# Patient Record
Sex: Female | Born: 1965 | Race: Black or African American | Hispanic: No | Marital: Single | Smoking: Former smoker
Health system: Southern US, Community
[De-identification: ages and names within clinical notes are randomized; demographics above are authoritative.]

## PROBLEM LIST (undated history)

## (undated) DIAGNOSIS — K219 Gastro-esophageal reflux disease without esophagitis: Secondary | ICD-10-CM

## (undated) DIAGNOSIS — I2699 Other pulmonary embolism without acute cor pulmonale: Secondary | ICD-10-CM

## (undated) DIAGNOSIS — D126 Benign neoplasm of colon, unspecified: Secondary | ICD-10-CM

## (undated) DIAGNOSIS — I4581 Long QT syndrome: Secondary | ICD-10-CM

## (undated) DIAGNOSIS — Z8719 Personal history of other diseases of the digestive system: Secondary | ICD-10-CM

## (undated) DIAGNOSIS — J189 Pneumonia, unspecified organism: Secondary | ICD-10-CM

## (undated) DIAGNOSIS — R0789 Other chest pain: Secondary | ICD-10-CM

## (undated) DIAGNOSIS — I739 Peripheral vascular disease, unspecified: Secondary | ICD-10-CM

## (undated) DIAGNOSIS — I10 Essential (primary) hypertension: Secondary | ICD-10-CM

## (undated) DIAGNOSIS — F419 Anxiety disorder, unspecified: Secondary | ICD-10-CM

## (undated) DIAGNOSIS — I82409 Acute embolism and thrombosis of unspecified deep veins of unspecified lower extremity: Secondary | ICD-10-CM

## (undated) DIAGNOSIS — F329 Major depressive disorder, single episode, unspecified: Secondary | ICD-10-CM

## (undated) DIAGNOSIS — R011 Cardiac murmur, unspecified: Secondary | ICD-10-CM

## (undated) DIAGNOSIS — R9431 Abnormal electrocardiogram [ECG] [EKG]: Secondary | ICD-10-CM

## (undated) DIAGNOSIS — R0600 Dyspnea, unspecified: Secondary | ICD-10-CM

## (undated) DIAGNOSIS — D649 Anemia, unspecified: Secondary | ICD-10-CM

## (undated) DIAGNOSIS — F32A Depression, unspecified: Secondary | ICD-10-CM

## (undated) DIAGNOSIS — R569 Unspecified convulsions: Secondary | ICD-10-CM

## (undated) DIAGNOSIS — E119 Type 2 diabetes mellitus without complications: Secondary | ICD-10-CM

## (undated) DIAGNOSIS — G4733 Obstructive sleep apnea (adult) (pediatric): Secondary | ICD-10-CM

## (undated) DIAGNOSIS — E669 Obesity, unspecified: Secondary | ICD-10-CM

## (undated) DIAGNOSIS — M1990 Unspecified osteoarthritis, unspecified site: Secondary | ICD-10-CM

## (undated) HISTORY — DX: Obstructive sleep apnea (adult) (pediatric): G47.33

## (undated) HISTORY — DX: Other pulmonary embolism without acute cor pulmonale: I26.99

## (undated) HISTORY — PX: COLONOSCOPY: SHX174

## (undated) HISTORY — PX: CARDIAC CATHETERIZATION: SHX172

## (undated) HISTORY — PX: DILATION AND CURETTAGE OF UTERUS: SHX78

## (undated) HISTORY — DX: Acute embolism and thrombosis of unspecified deep veins of unspecified lower extremity: I82.409

## (undated) HISTORY — DX: Obesity, unspecified: E66.9

## (undated) HISTORY — DX: Abnormal electrocardiogram (ECG) (EKG): R94.31

## (undated) HISTORY — DX: Essential (primary) hypertension: I10

## (undated) HISTORY — DX: Other chest pain: R07.89

## (undated) HISTORY — PX: TOTAL HIP ARTHROPLASTY: SHX124

## (undated) HISTORY — PX: LUMBAR DISC SURGERY: SHX700

## (undated) HISTORY — PX: OTHER SURGICAL HISTORY: SHX169

## (undated) HISTORY — PX: BACK SURGERY: SHX140

---

## 1999-08-04 ENCOUNTER — Encounter (INDEPENDENT_AMBULATORY_CARE_PROVIDER_SITE_OTHER): Payer: Self-pay | Admitting: *Deleted

## 1999-08-04 ENCOUNTER — Ambulatory Visit (HOSPITAL_BASED_OUTPATIENT_CLINIC_OR_DEPARTMENT_OTHER): Admission: RE | Admit: 1999-08-04 | Discharge: 1999-08-04 | Payer: Self-pay | Admitting: Orthopedic Surgery

## 1999-10-12 ENCOUNTER — Other Ambulatory Visit: Admission: RE | Admit: 1999-10-12 | Discharge: 1999-10-12 | Payer: Self-pay | Admitting: Family Medicine

## 1999-11-03 ENCOUNTER — Emergency Department (HOSPITAL_COMMUNITY): Admission: EM | Admit: 1999-11-03 | Discharge: 1999-11-03 | Payer: Self-pay

## 1999-11-12 ENCOUNTER — Other Ambulatory Visit: Admission: RE | Admit: 1999-11-12 | Discharge: 1999-11-12 | Payer: Self-pay | Admitting: Gynecology

## 1999-11-12 ENCOUNTER — Encounter (INDEPENDENT_AMBULATORY_CARE_PROVIDER_SITE_OTHER): Payer: Self-pay

## 1999-11-15 ENCOUNTER — Encounter: Admission: RE | Admit: 1999-11-15 | Discharge: 1999-11-15 | Payer: Self-pay | Admitting: Family Medicine

## 1999-11-15 ENCOUNTER — Encounter: Payer: Self-pay | Admitting: Family Medicine

## 1999-11-17 ENCOUNTER — Emergency Department (HOSPITAL_COMMUNITY): Admission: EM | Admit: 1999-11-17 | Discharge: 1999-11-17 | Payer: Self-pay

## 2000-10-31 ENCOUNTER — Other Ambulatory Visit: Admission: RE | Admit: 2000-10-31 | Discharge: 2000-10-31 | Payer: Self-pay | Admitting: Family Medicine

## 2001-02-05 ENCOUNTER — Encounter: Payer: Self-pay | Admitting: Obstetrics and Gynecology

## 2001-02-05 ENCOUNTER — Ambulatory Visit (HOSPITAL_COMMUNITY): Admission: RE | Admit: 2001-02-05 | Discharge: 2001-02-05 | Payer: Self-pay | Admitting: Obstetrics and Gynecology

## 2001-05-19 ENCOUNTER — Inpatient Hospital Stay (HOSPITAL_COMMUNITY): Admission: AD | Admit: 2001-05-19 | Discharge: 2001-05-19 | Payer: Self-pay | Admitting: Obstetrics & Gynecology

## 2001-06-04 ENCOUNTER — Inpatient Hospital Stay (HOSPITAL_COMMUNITY): Admission: AD | Admit: 2001-06-04 | Discharge: 2001-06-07 | Payer: Self-pay | Admitting: Obstetrics & Gynecology

## 2002-05-11 ENCOUNTER — Emergency Department (HOSPITAL_COMMUNITY): Admission: EM | Admit: 2002-05-11 | Discharge: 2002-05-11 | Payer: Self-pay | Admitting: Emergency Medicine

## 2003-04-15 ENCOUNTER — Other Ambulatory Visit: Admission: RE | Admit: 2003-04-15 | Discharge: 2003-04-15 | Payer: Self-pay | Admitting: Family Medicine

## 2003-11-14 ENCOUNTER — Encounter: Admission: RE | Admit: 2003-11-14 | Discharge: 2003-11-14 | Payer: Self-pay | Admitting: Orthopedic Surgery

## 2003-11-28 ENCOUNTER — Encounter: Admission: RE | Admit: 2003-11-28 | Discharge: 2003-11-28 | Payer: Self-pay | Admitting: Orthopedic Surgery

## 2003-12-12 ENCOUNTER — Encounter: Admission: RE | Admit: 2003-12-12 | Discharge: 2003-12-12 | Payer: Self-pay | Admitting: Orthopedic Surgery

## 2005-05-30 ENCOUNTER — Ambulatory Visit: Payer: Self-pay | Admitting: Internal Medicine

## 2005-10-17 ENCOUNTER — Emergency Department (HOSPITAL_COMMUNITY): Admission: EM | Admit: 2005-10-17 | Discharge: 2005-10-18 | Payer: Self-pay | Admitting: Emergency Medicine

## 2006-01-19 ENCOUNTER — Ambulatory Visit: Payer: Self-pay | Admitting: Family Medicine

## 2006-01-19 ENCOUNTER — Inpatient Hospital Stay (HOSPITAL_COMMUNITY): Admission: EM | Admit: 2006-01-19 | Discharge: 2006-01-20 | Payer: Self-pay | Admitting: Emergency Medicine

## 2006-01-19 ENCOUNTER — Ambulatory Visit: Payer: Self-pay | Admitting: Internal Medicine

## 2006-02-25 ENCOUNTER — Ambulatory Visit (HOSPITAL_BASED_OUTPATIENT_CLINIC_OR_DEPARTMENT_OTHER): Admission: RE | Admit: 2006-02-25 | Discharge: 2006-02-25 | Payer: Self-pay | Admitting: Family Medicine

## 2006-03-07 ENCOUNTER — Ambulatory Visit: Payer: Self-pay | Admitting: Cardiology

## 2006-03-07 ENCOUNTER — Inpatient Hospital Stay (HOSPITAL_COMMUNITY): Admission: RE | Admit: 2006-03-07 | Discharge: 2006-03-11 | Payer: Self-pay | Admitting: Orthopedic Surgery

## 2006-03-12 ENCOUNTER — Emergency Department (HOSPITAL_COMMUNITY): Admission: EM | Admit: 2006-03-12 | Discharge: 2006-03-12 | Payer: Self-pay | Admitting: Emergency Medicine

## 2006-04-01 HISTORY — PX: JOINT REPLACEMENT: SHX530

## 2006-06-01 ENCOUNTER — Ambulatory Visit: Payer: Self-pay | Admitting: Internal Medicine

## 2006-08-07 ENCOUNTER — Emergency Department (HOSPITAL_COMMUNITY): Admission: EM | Admit: 2006-08-07 | Discharge: 2006-08-07 | Payer: Self-pay | Admitting: Emergency Medicine

## 2006-09-28 ENCOUNTER — Encounter: Admission: RE | Admit: 2006-09-28 | Discharge: 2006-09-28 | Payer: Self-pay | Admitting: Orthopedic Surgery

## 2006-10-12 ENCOUNTER — Encounter: Admission: RE | Admit: 2006-10-12 | Discharge: 2006-10-12 | Payer: Self-pay | Admitting: Orthopedic Surgery

## 2007-01-14 ENCOUNTER — Ambulatory Visit: Payer: Self-pay | Admitting: Vascular Surgery

## 2007-01-14 ENCOUNTER — Emergency Department (HOSPITAL_COMMUNITY): Admission: EM | Admit: 2007-01-14 | Discharge: 2007-01-14 | Payer: Self-pay | Admitting: Emergency Medicine

## 2007-01-31 ENCOUNTER — Ambulatory Visit: Payer: Self-pay | Admitting: Vascular Surgery

## 2007-04-17 ENCOUNTER — Encounter: Admission: RE | Admit: 2007-04-17 | Discharge: 2007-04-17 | Payer: Self-pay | Admitting: Neurosurgery

## 2007-06-07 ENCOUNTER — Encounter: Admission: RE | Admit: 2007-06-07 | Discharge: 2007-06-07 | Payer: Self-pay | Admitting: Neurosurgery

## 2007-06-19 ENCOUNTER — Emergency Department (HOSPITAL_COMMUNITY): Admission: EM | Admit: 2007-06-19 | Discharge: 2007-06-19 | Payer: Self-pay | Admitting: Emergency Medicine

## 2007-06-20 ENCOUNTER — Emergency Department (HOSPITAL_COMMUNITY): Admission: EM | Admit: 2007-06-20 | Discharge: 2007-06-20 | Payer: Self-pay | Admitting: Emergency Medicine

## 2007-06-29 ENCOUNTER — Ambulatory Visit: Payer: Self-pay | Admitting: Internal Medicine

## 2007-07-06 ENCOUNTER — Inpatient Hospital Stay (HOSPITAL_COMMUNITY): Admission: RE | Admit: 2007-07-06 | Discharge: 2007-07-11 | Payer: Self-pay | Admitting: Neurosurgery

## 2007-07-23 ENCOUNTER — Emergency Department (HOSPITAL_COMMUNITY): Admission: EM | Admit: 2007-07-23 | Discharge: 2007-07-23 | Payer: Self-pay | Admitting: Emergency Medicine

## 2008-02-08 ENCOUNTER — Encounter: Admission: RE | Admit: 2008-02-08 | Discharge: 2008-02-08 | Payer: Self-pay | Admitting: Family Medicine

## 2008-08-21 DIAGNOSIS — F3289 Other specified depressive episodes: Secondary | ICD-10-CM | POA: Insufficient documentation

## 2008-08-21 DIAGNOSIS — F329 Major depressive disorder, single episode, unspecified: Secondary | ICD-10-CM

## 2008-08-21 DIAGNOSIS — Q898 Other specified congenital malformations: Secondary | ICD-10-CM | POA: Insufficient documentation

## 2008-08-21 DIAGNOSIS — M810 Age-related osteoporosis without current pathological fracture: Secondary | ICD-10-CM | POA: Insufficient documentation

## 2008-09-13 ENCOUNTER — Emergency Department: Payer: Self-pay | Admitting: Emergency Medicine

## 2008-10-22 ENCOUNTER — Encounter: Admission: RE | Admit: 2008-10-22 | Discharge: 2008-11-19 | Payer: Self-pay | Admitting: Orthopedic Surgery

## 2008-11-12 ENCOUNTER — Emergency Department: Payer: Self-pay | Admitting: Emergency Medicine

## 2008-11-19 ENCOUNTER — Encounter (INDEPENDENT_AMBULATORY_CARE_PROVIDER_SITE_OTHER): Payer: Self-pay | Admitting: *Deleted

## 2009-01-08 ENCOUNTER — Ambulatory Visit: Payer: Self-pay | Admitting: Cardiovascular Disease

## 2009-01-08 ENCOUNTER — Encounter: Payer: Self-pay | Admitting: Nurse Practitioner

## 2009-01-08 DIAGNOSIS — R079 Chest pain, unspecified: Secondary | ICD-10-CM | POA: Insufficient documentation

## 2009-01-19 ENCOUNTER — Telehealth (INDEPENDENT_AMBULATORY_CARE_PROVIDER_SITE_OTHER): Payer: Self-pay | Admitting: Radiology

## 2009-01-20 ENCOUNTER — Ambulatory Visit: Payer: Self-pay

## 2009-01-21 ENCOUNTER — Ambulatory Visit: Payer: Self-pay

## 2009-01-21 ENCOUNTER — Encounter: Payer: Self-pay | Admitting: Cardiovascular Disease

## 2009-02-09 ENCOUNTER — Telehealth: Payer: Self-pay | Admitting: Internal Medicine

## 2009-02-25 ENCOUNTER — Encounter: Payer: Self-pay | Admitting: Internal Medicine

## 2009-03-06 ENCOUNTER — Encounter: Admission: RE | Admit: 2009-03-06 | Discharge: 2009-03-06 | Payer: Self-pay | Admitting: Family Medicine

## 2009-03-11 ENCOUNTER — Encounter (INDEPENDENT_AMBULATORY_CARE_PROVIDER_SITE_OTHER): Payer: Self-pay | Admitting: *Deleted

## 2009-04-09 ENCOUNTER — Ambulatory Visit: Payer: Self-pay | Admitting: Gastroenterology

## 2009-05-15 ENCOUNTER — Ambulatory Visit: Payer: Self-pay | Admitting: Internal Medicine

## 2009-05-15 DIAGNOSIS — E349 Endocrine disorder, unspecified: Secondary | ICD-10-CM | POA: Insufficient documentation

## 2009-06-26 ENCOUNTER — Emergency Department: Payer: Self-pay | Admitting: Emergency Medicine

## 2009-11-26 ENCOUNTER — Emergency Department: Payer: Self-pay | Admitting: Emergency Medicine

## 2009-12-22 ENCOUNTER — Telehealth: Payer: Self-pay | Admitting: Internal Medicine

## 2010-01-02 ENCOUNTER — Encounter: Admission: RE | Admit: 2010-01-02 | Discharge: 2010-01-02 | Payer: Self-pay | Admitting: Family Medicine

## 2010-04-24 ENCOUNTER — Emergency Department: Payer: Self-pay | Admitting: Emergency Medicine

## 2010-05-22 ENCOUNTER — Encounter: Payer: Self-pay | Admitting: Family Medicine

## 2010-05-23 ENCOUNTER — Encounter: Payer: Self-pay | Admitting: Family Medicine

## 2010-06-01 NOTE — Assessment & Plan Note (Signed)
Summary: ROV   CC:  ROV/  .  History of Present Illness: Mrs. [name] is a 45 year old woman with history of prolonged QT.  she was seen most recently last fall when she presented with exertional chest discomfort. She underwent Myoview scanning at that time which demonstrate normal left ventricular function and normal perfusion. She was also having   abdominal discomfort which was evaluated by GI.  She turned out to have a malignant polyp  Her son was recently confirmed as [name]  positive  Allergies: No Known Drug Allergies  Past History:  Past Medical History: Last updated: 01/08/2009 Long QT Hypertension Obesity Chest Pain      a. prior negative MV and NL echo.  Past Surgical History: Last updated: 2008-08-29  PROCEDURE:  Bilateral L5-S1 diskectomy and laminectomy, facetectomy,   interbody fusion with cages, 12 x 22, pedicle screws L5-S1,   posterolateral arthrodesis with autograft and BMP.  Cell Saver, C-arm.      SURGEON:  Hilda Lias, M.D.   Family History: Last updated: 08/29/08 Mother died at the age of 58 with heart disease Father died also with heart disease and diabetes Myocardial infarction present in the patient's grandmother at age 1 as well as in her grandfather at age 46.  Social History: Last updated: 08-29-2008 Tobacco Use - No.  Alcohol Use - no  Vital Signs:  Patient profile:   45 year old female Height:      65 inches Weight:      283 pounds BMI:     47.26 Pulse rate:   78 / minute Pulse rhythm:   regular BP sitting:   109 / 74  (right arm) Cuff size:   large  Vitals Entered By: Judithe Modest CMA (May 15, 2009 2:59 PM)  Physical Exam  General:  The patient was alert and oriented in no acute distress. HEENT Normal.  Neck veins were flat, carotids were brisk.  Lungs were clear.  Heart sounds were regular without murmurs or gallops.  Abdomen was soft with active bowel sounds. There is no clubbing cyanosis or edema. Skin Warm  and dry    EKG  Procedure date:  05/15/2009  Findings:      sinus rhythm 78 .12.09./54 LQTS  Impression & Recommendations:  Problem # 1:  LONG QT SYNDROME (ICD-759.89) continue her inderal Orders: EKG w/ Interpretation (93000)  Problem # 2:  OBESITY (ICD-278.00) advised tp ;pse woeoght  she will work at it  Patient Instructions: 1)  Your physician recommends that you schedule a follow-up appointment in: 12 MONTHS

## 2010-06-01 NOTE — Progress Notes (Signed)
Summary: chest pains  Phone Note Call from Patient Call back at [facility] Phone [phone]   Caller: Patient Summary of Call: Chest pains Initial call taken by: Judie Grieve,  December 22, 2009 9:47 AM  Follow-up for Phone Call        started a couple of days ago, under stress, taking asa, pain in left arm and chest (chest of chest at breast bone) no radiation into back or jaw- shooting type of pain, SOB.  Asa hasnt helped. nausea  but no vomiting, didnt sleep last night because of nausea and sweating - laid on bathroom floor because it was cold.  No pain now - staying still and relaxing her mind.  Hurts a little bit when she breaths and  when she looks to the right she can feel in shooting down into her arm.  has cpap that helps.  under a lot of pressure.  when walking up  the stairs she doesn't have any air.  Swelling in ankles, puts them up and it gets better. feels like she is bloated.  Will review with Dr Graciela Husbands Follow-up by: Charolotte Capuchin, RN,  December 22, 2009 10:00 AM  Additional Follow-up for Phone Call Additional follow up Details #1::        Reviewed with Dr Graciela Husbands who wants pt to see her primary MD.  Pt stats understanding Additional Follow-up by: Charolotte Capuchin, RN,  December 22, 2009 10:13 AM

## 2010-08-17 ENCOUNTER — Encounter: Payer: Self-pay | Admitting: Internal Medicine

## 2010-08-17 ENCOUNTER — Ambulatory Visit (INDEPENDENT_AMBULATORY_CARE_PROVIDER_SITE_OTHER): Payer: Medicare Other | Admitting: Internal Medicine

## 2010-08-17 VITALS — BP 120/73 | HR 65 | Ht 65.0 in | Wt 299.0 lb

## 2010-08-17 DIAGNOSIS — Q898 Other specified congenital malformations: Secondary | ICD-10-CM

## 2010-08-17 DIAGNOSIS — Z79899 Other long term (current) drug therapy: Secondary | ICD-10-CM

## 2010-08-17 NOTE — Assessment & Plan Note (Signed)
Stable on current medication She is to followup with Dr. Nathanial Rancher regarding her potassium level and to try to keep it above 4. She has been started on a diuretic although the potassium sparing one.

## 2010-08-17 NOTE — Progress Notes (Signed)
  HPI  [name] is a 45 y.o. 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 She takes Inderal. She has had a history of atypical chest pain. She underwent Myoview scanning 2011 which demonstrated no ischemia and normal left ventricular function  The patient denies chest pain,  She has significant exercise intolerance associated with progressive and significant weight gain  Now at  nearluy 300lbs;  She also has obstructive sleep apnea with significant daytime somnolence. She is using CPAP with only modest benefit  There has been no intercurrent syncope No past medical history on file.  No past surgical history on file.  No current outpatient prescriptions on file.    Allergies not on file  Review of Systems negative except from HPI and PMH  Physical Exam Well developed and morbidly obese African American woman appearing her stated age HENT normal E scleral and icterus clear Neck Supple Clear to ausculation Regular rate and rhythm, no murmurs gallops or rub Soft with active bowel sounds No clubbing cyanosisTraceAlert and oriented, grossly normal motor and sensory function Skin Warm and Dry  ECG sinus rhythm at 65 Intervals 0.1 to size 0.09/0.49 there is a suggestion of a delta wave  Assessment and  Plan

## 2010-08-17 NOTE — Patient Instructions (Signed)
Your physician recommends that you schedule a follow-up appointment in: YEAR WITH DR KLEIN  Your physician recommends that you continue on your current medications as directed. Please refer to the Current Medication list given to you today. 

## 2010-08-17 NOTE — Assessment & Plan Note (Signed)
I encouraged further weight loss and trying to maintain caloric consumption by staining and walking as much as possible

## 2010-09-13 ENCOUNTER — Ambulatory Visit: Payer: Self-pay | Admitting: Internal Medicine

## 2010-09-14 NOTE — Discharge Summary (Signed)
[name], [name]            ACCOUNT NO.:  [id number]   MEDICAL RECORD NO.:  [id number]          PATIENT TYPE:  INP   LOCATION:  3006                         FACILITY:  [facility]   PHYSICIAN:  Hilda Lias, M.D.   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   CLINICAL HISTORY:  The patient was admitted because of back pain  radiating to both legs.  She had failed conservative treatment.  MRI  showed degenerative disk disease at L5-S1 with foraminal stenosis.  Surgery was advised.   LABORATORY:  Normal.   HOSPITAL COURSE:  The patient was taken to surgery where an L5-S1 fusion  using pedicle screws and cage was done.  At the beginning, the patient  had quite a bit of pain.  She was not ambulating, but for the past 2  days, she has been walking.  The pain has decreased, and she is ready to  go home today.   CONDITION ON DISCHARGE:  Improved.   MEDICATIONS:  1. Percocet.  2. Diazepam.   DIET:  She was encouraged to lose weight.   ACTIVITY:  No strenuous activity this week.  No bending.   FOLLOWUP:  She will be seen by me in my office in 4 weeks.           ______________________________  Hilda Lias, M.D.     EB/MEDQ  D:  07/11/2007  T:  07/11/2007  Job:  [id number]

## 2010-09-14 NOTE — Assessment & Plan Note (Signed)
 HEALTHCARE                         ELECTROPHYSIOLOGY OFFICE NOTE   Sonia, ZAHN                   MRN:          [id number]  DATE:06/29/2007                            DOB:          [birth year]    [name] is seen in follow-up for her long QT syndrome, for which he  takes Inderal.  She has had no symptomatic palpitations and no syncope.  She is scheduled to undergo spine surgery with Dr. Jeral Fruit.   MEDICATIONS:  Her medications include:  1. Maxzide 1/2.  2. Lexapro.  3. Prevacid.  4. Inderal LA 80 on examination.   PHYSICAL EXAMINATION:  VITAL SIGNS:  Today her blood pressure is 119/75  with a pulse of 65.  LUNGS:  Clear.  HEART:  Sounds were regular.  EXTREMITIES:  Without edema.   Electrocardiogram was notable for a QT interval of 460.   IMPRESSION:  1. Long QT.  2. Hypertension.  3. Obesity.  4. Impending surgery.   Mr. Buchinger is stable.  Will plan to see her again in one year's time,  but I have asked her to let Dr. Jeral Fruit let us know when her surgery is,  so that we can be available as needed for that.     Duke Salvia, MD, [facility]  Electronically Signed    SCK/MedQ  DD: 06/29/2007  DT: 06/29/2007  Job #: [id number]   cc:   Hilda Lias, M.D.

## 2010-09-14 NOTE — Op Note (Signed)
NAMEAMZIE, SILLAS            ACCOUNT NO.:  [id number]   MEDICAL RECORD NO.:  [id number]          PATIENT TYPE:  INP   LOCATION:  3006                         FACILITY:  [facility]   PHYSICIAN:  Hilda Lias, M.D.   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   PROCEDURE:  Bilateral L5-S1 diskectomy and laminectomy, facetectomy,  interbody fusion with cages, 12 x 22, pedicle screws L5-S1,  posterolateral arthrodesis with autograft and BMP.  Cell Saver, C-arm.   SURGEON:  Hilda Lias, M.D.   ASSISTANT:  Cristi Loron, M.D.   CLINICAL HISTORY:  The patient is admitted because of a chronic history  of back pain with radiation to both legs, left worse than right one.  This problem had been going for more than 20 years.  She has failed  conservative treatment.  Surgery was advised and the risk was explained  in the history and physical.   PROCEDURE:  Ms. Urda was taken to the OR and after intubation, she was  positioned in prone manner.  The patient is morbidly obese and we were  unable to feel any bone whatsoever in the spine.  Nevertheless, to  localize the area we did an x-ray which showed that we were at the level  of L4-5.  From then on, the skin was cleaned with DuraPrep, drapes were  applied.  Midline incision from the needle stick which was at 4-5,  carried all the way down.  We went through the subcutaneous tissue  through a thick adipose tissue until we found the spine.  Retraction was  done laterally.  X-rays showed that indeed we were at the level of L5-  S1.  From then on, with the Leksell, we removed the spinous process  of  L5 and the lamina of L5 bilaterally as well as the facet.  We entered  the nerve root, quite surrounded by scar tissue and lysis was achieved.  Then retraction was made in the left side first and we entered the disk  space which was quite narrow.  Total diskectomy was done.  The same  procedure was done on the right side.  Then we introduced more curette  and at the end we had good decompression of the disk space.  We were  able to introduce two cages of 12 x 22.  Inside the cages anteriorly was  a piece of BMP as well as autograft.  Then using the C-arm first in AP  view and then a lateral view, we identified the pedicle of L5 and S1.  Pedicle probe was inserted and four screws of 5.5 x 40 were inserted.  They were secured in place with a rod and caps.  Then we went laterally  and withdrew the ala of the sacrum as well as the lateral aspect of  the  facet of  L5.  Then a mix of BMP and  autograft was used for further fusion.  We went back again and we  drilled the pedicle.  They were cleaned.  There was not any compromise  of the medial aspect of the pedicle and this had plenty of space.  From  then on, we closed the wound using Vicryl and Steri-Strips.           ______________________________  Hilda Lias, M.D.     EB/MEDQ  D:  07/06/2007  T:  07/08/2007  Job:  [id number]

## 2010-09-14 NOTE — Procedures (Signed)
DUPLEX DEEP VENOUS EXAM - LOWER EXTREMITY   INDICATION:  Known right lower extremity DVT, increased pain and  swelling bilaterally.   HISTORY:  Edema:  Yes.  Trauma/Surgery:  Larey Seat off porch on 01/11/2007.  Pain:  Bilateral.  PE:  No.  Previous DVT:  Right posterior tibial vein, 01/14/2007, per [facility].  Anticoagulants:  Aspirin.  Other:   DUPLEX EXAM:                CFV   SFV   PopV  PTV    GSV                R  L  R  L  R  L  R   L  R  L  Thrombosis    0  0  0  0  0  0  +   0  0  0  Spontaneous   +  +  +  +  +  +  D   +  +  +  Phasic        +  +  +  +  +  +  D   +  +  +  Augmentation  +  +  +  +  +  +  D   +  +  +  Compressible  +  +  +  +  +  +  P   +  +  +  Competent     +  +  +  +  +  +  D   +  +  +   Legend:  + - yes  o - no  p - partial  D - decreased   IMPRESSION:  1. The right posterior and peroneal veins show evidence of subacute      deep venous thrombosis.  All other imaged deep veins appear patent.  2. The left lower extremity shows no evidence of deep venous      thrombosis.  3. Evidence of complex nonvascularized mass in left popliteal fossa      suggestive of baker's cyst,      measuring 1.79 cm X 2.10 cm.  4. Dr. Darrick Penna was notified.    _____________________________  Janetta Hora Fields, MD   AS/MEDQ  D:  01/31/2007  T:  02/01/2007  Job:  [id number]

## 2010-09-14 NOTE — H&P (Signed)
NAMEFIORELLA, HANAHAN            ACCOUNT NO.:  [id number]   MEDICAL RECORD NO.:  [id number]          PATIENT TYPE:  INP   LOCATION:  3006                         FACILITY:  [facility]   PHYSICIAN:  Hilda Lias, M.D.   DATE OF BIRTH:  [birth year]   DATE OF ADMISSION:  07/06/2007  DATE OF DISCHARGE:                              HISTORY & PHYSICAL   Ms. Bhullar is a lady who was seen by me in my office initially at the  end of last year complaining of back pain for more than 2 years with  radiation to both legs, the left worse than the right one.  She has had  conservative treatment, she had medication, physical therapy, and she is  not any better.  Because of the findings and because she had failed with  conservative treatment she wanted to proceed with surgery.   PAST MEDICAL HISTORY:  Knee replacement, surgery on the left foot.   FAMILY HISTORY:  Mother died at the age of 57 with heart disease.  Father died also with heart disease and diabetes.   REVIEW OF SYSTEMS:  Positive for swelling of both feet, back pain.   PHYSICAL EXAMINATION:  The patient is 265 pounds, 5 feet 5 inches.  She  came in limping from the left leg.  HEAD, EARS, NOSE AND THROAT:  Normal.  NECK:  Normal.  LUNGS:  There are some rhonchi bilaterally.  CARDIOVASCULAR:  Normal.  ABDOMEN:  Normal.  EXTREMITIES:  Normal pulses.  NEURO:  Showed that she has decreased flexibility of the lumbar spine.  With straight leg raising, LSR 60 degrees in the left side and 80  degrees in the right side.   The lumbar spine x-ray showed that she has degenerative disk disease.  The MRI showed diffuse bulging disks, facet arthropathy left worse than  the right one.   CLINICAL IMPRESSION:  Degenerative disk disease L5-S1 with bilateral  radiculopathy.   RECOMMENDATION:  The patient is being admitted for surgery.  We did a  discogram which showed then the disk between 4-5 is normal and  reproduction of the pain at the level  5-1.  She is going to have  discectomy with interbody fusion with cages and pedicle screws.  The  patient knows about the risks such as infection, CSF leak, worsening of  the pain, paralysis, no improvement whatsoever.           ______________________________  Hilda Lias, M.D.     EB/MEDQ  D:  07/06/2007  T:  07/07/2007  Job:  [id number]

## 2010-09-17 NOTE — Procedures (Signed)
[name], [name]            ACCOUNT NO.:  [id number]   MEDICAL RECORD NO.:  [id number]          PATIENT TYPE:  OUT   LOCATION:  SLEEP CENTER                 FACILITY:  [facility]   PHYSICIAN:  Clinton D. Maple Hudson, MD, FCCP, FACPDATE OF BIRTH:  January 08, 1966   DATE OF STUDY:  02/25/2006                              NOCTURNAL POLYSOMNOGRAM   REFERRING PHYSICIAN:  Burnell Blanks, MD   INDICATION FOR STUDY:  Hypersomnia with sleep apnea.   EPWORTH SLEEPINESS SCORE:  14/24, BMI 38, weight 230 pounds.   MEDICATIONS:  Inderal, ibuprofen, triamterine, omeprazole, Lexapro, Ambien.   SLEEP ARCHITECTURE:  Total sleep time 322 minutes with sleep efficiency 76%.  Stage I was 4%, stage II 87%, stages III and IV absent.  REM 10% of total  sleep time.  Sleep latency 59 minutes, REM latency 202 minutes.  Awake after  sleep onset 21 minutes.  Arousal index 4.5.  No bedtime medication was  taken.   RESPIRATORY DATA:  Split study protocol.  Apnea/hypopnea index (AHI, RDI)  was 9.3 obstructive events per hour, indicating mild obstructive sleep  apnea/hypopnea syndrome before CPAP.  There 3 obstructive apneas and 24  hypopneas before CPAP.  The events were nonpositional but more frequent  while supine.  REM AHI 7.7 per hour.  CPAP was titrated to 7 CWP, AHI 1.5  per hour.  A medium ResMed Mirage Quattro full face mask was used with a  heated humidifier.   OXYGEN DATA:  Snoring with oxygen desaturation to a nadir of 88%.  Oxygen  saturation after CPAP control held at 94% on room air.   CARDIAC DATA:  Normal sinus rhythm.   MOVEMENT-PARASOMNIA:  Occasional limb jerk, insignificant.   IMPRESSIONS-RECOMMENDATIONS:  1. Mild obstructive sleep apnea/hypopnea syndrome, AHI 9.3 per hour with      events somewhat more frequent while supine but not exclusively related      to supine position.  Snoring with oxygen desaturation to a nadir of      88%.  2. Successful CPAP titration to 7 CWP, AHI 1.5 per hour.  A  medium ResMed      Mirage Quattro full face mask was used with a heated humidifier.      Clinton D. Maple Hudson, MD, FCCP, FACP  Diplomate, Biomedical engineer of Sleep Medicine  Electronically Signed     CDY/MEDQ  D:  02/26/2006 15:05:10  T:  02/27/2006 14:15:38  Job:  [id number]

## 2010-09-17 NOTE — Consult Note (Signed)
NAMEJOCELYN, NOLD NO.:  [id number]   MEDICAL RECORD NO.:  [id number]          PATIENT TYPE:  INP   LOCATION:  5028                         FACILITY:  [facility]   PHYSICIAN:  Jonelle Sidle, MD DATE OF BIRTH:  02/05/1966   DATE OF CONSULTATION:  03/08/2006  DATE OF DISCHARGE:                                   CONSULTATION   PRIMARY CARDIOLOGIST:  Duke Salvia, MD, [facility]   REASON FOR CONSULTATION:  Episode of dizziness and known history of long QT  syndrome.   HISTORY OF PRESENT ILLNESS:  Ms. Roeder is a 45 year old woman with a  history of long QT syndrome, hypertension, short PR interval with possible  delta waves/preexcitation, and bradycardia.  She has been managed on low-  dose Inderal LA by Dr. Graciela Husbands and does not report any major problems with  syncope, and as best I can tell, has had no documented ventricular  arrhythmias.  She is now status post total right hip replacement by Dr.  Chaney Malling on 6 November due to severe osteoarthritis.  She tolerated surgery  well.  Earlier this morning, she was to stand and do some ambulation with  physical therapy and apparently complained of being dizzy after standing.  It was noted that her systolic blood pressure dropped down to 90.  She was  guided back to the chair, and since that time has not complained of any  specific dizziness.  She denies having any sense of palpitations at that  time and did not have frank syncope.  An electrocardiogram was obtained, and  we were asked to see the patient given her history.   Her electrocardiogram from earlier this morning shows sinus rhythm with a  short PR interval  of 110 msec with nonspecific, predominantly anterolateral  ST-T wave changes.  She does have a prolonged QT which corrects at  approximately 480 msec.  She reports that she has been compliant with her  Inderal LA at home, although the dose had to be decreased apparently due to  resting bradycardia, and she  is now taking 60 mg a day.  She otherwise is in  no acute distress at this time and does not report having any problems with  syncope of late.   ALLERGIES:  No known drug allergies.   PRESENT MEDICATIONS:  1. Arixtra 2.5 mg subcutaneously q. 24 h.  2. Colace 100 mg p.o. b.i.d.  3. Senokot 1 tablet p.o. b.i.d.  4. Dilaudid PCA pump.  5. Inderal LA 60 mg p.o. daily.  6. Lexapro 20 mg p.o. daily.  7. Triamterene/hydrochlorothiazide 37.5/25 mg p.o. daily.  8. Relafen 750 mg p.o. daily.   PAST MEDICAL HISTORY:  As outlined in the History of Present Illness.  I see  also prior documentation of hypertension.   FAMILY HISTORY:  The patient's mother apparently died with a history of long  QT syndrome at age 64.  Myocardial infarction present in the patient's  grandmother at age 43 as well as in her grandfather at age 20.   SOCIAL HISTORY:  [place]  She denies  any tobacco or alcohol use.  Denies illicit substance use.   REVIEW OF SYSTEMS:  As described in History of Present Illness, otherwise  negative.   PHYSICAL EXAMINATION:  VITAL SIGNS: Temperature 97.0 degrees, heart rate 75,  respirations 20, blood pressure 101/64, oxygen saturation 100% on 3 liters  nasal cannula.  GENERAL: The patient is comfortable, in no acute distress.  HEENT:  Conjunctivae and lids normal.  Oropharynx clear.  NECK:  Supple without elevated jugular venous pressure.  No loud bruits, no  thyromegaly noted.  LUNGS: Clear without labored breathing.  CARDIAC: Exam reveals a regular rate and rhythm without loud systolic murmur  or S3 gallop.  ABDOMEN:  Soft with normoactive bowel sounds.  EXTREMITIES:  Show no marked pitting edema.  Distal pulses are 2+.  SKIN: No ulcer changes are noted.  MUSCULOSKELETAL: No kyphosis is noted.  The patient is status post right hip  replacement with wounds dressed. These areas are clean, dry, and intact.   LABORATORY DATA:  WBC 7.9,  hemoglobin 9.4, down from 12.7 preoperatively.  Platelet count 189, down from 232 preoperatively.  Recent INR 1.0.  Sodium  131, potassium 3.9, chloride 97, bicarb 29, glucose 116, BUN 10, creatinine  1.1, calcium 8.2.  Recent urinalysis essentially normal.   Recent CT angiogram of the chest in late September reporting no pulmonary  emboli with normal thoracic aorta and no acute pulmonary process.   IMPRESSION:  1. Known long QT syndrome, managed with Inderal LA 60 mg p.o. daily and      followed by Dr. Graciela Husbands as an outpatient.  2. Recent episode of dizziness in the postoperative setting following      right total hip replacement while standing during initiation of      physical therapy. Blood pressure was mildly low at that time, and the      patient does have a postoperative anemia.  This may be unrelated to the      patient's long QT syndrome.  She has not been on telemetry, and it is      impossible to say if she had any associated dysrhythmia at the time.   RECOMMENDATIONS:  I discussed the situation with the patient.  At this  point, I would simply recommend observation on Inderal LA at the present  dose.  Would place her on telemetry to exclude the possibility of any  intermittent ventricular arrhythmias.  Will also follow up electrolytes and  an electrocardiogram in the morning.  We will continue to follow with you.      Jonelle Sidle, MD  Electronically Signed     SGM/MEDQ  D:  03/08/2006  T:  03/08/2006  Job:  [id number]   cc:   Thereasa Distance A. Chaney Malling, M.D.  Duke Salvia, MD, [facility]

## 2010-11-25 ENCOUNTER — Ambulatory Visit: Payer: Self-pay | Admitting: Internal Medicine

## 2010-12-31 ENCOUNTER — Other Ambulatory Visit: Payer: Self-pay | Admitting: Orthopaedic Surgery

## 2010-12-31 DIAGNOSIS — M25531 Pain in right wrist: Secondary | ICD-10-CM

## 2011-01-11 ENCOUNTER — Ambulatory Visit
Admission: RE | Admit: 2011-01-11 | Discharge: 2011-01-11 | Disposition: A | Discharge status: Home / Self Care | Payer: Medicare Other | Source: Ambulatory Visit | Attending: Orthopaedic Surgery | Admitting: Orthopaedic Surgery

## 2011-01-11 DIAGNOSIS — M25531 Pain in right wrist: Secondary | ICD-10-CM

## 2011-01-11 MED ORDER — IOHEXOL 180 MG/ML  SOLN
5.0000 mL | Freq: Once | INTRAMUSCULAR | Status: AC | PRN
  Administered 2011-01-11: 5 mL via INTRA_ARTICULAR

## 2011-01-24 LAB — BASIC METABOLIC PANEL
BUN: 13
CO2: 29
Calcium: 9.2
Creatinine, Ser: 0.9
GFR calc non Af Amer: >60
Glucose, Bld: 95
Sodium: 138

## 2011-01-24 LAB — CBC
Hemoglobin: 12.6
MCHC: 33.8
Platelets: 247
RDW: 13.1

## 2011-02-11 LAB — CBC
Hemoglobin: 12
MCHC: 33.7
RBC: 4.04

## 2011-02-11 LAB — COMPREHENSIVE METABOLIC PANEL
ALT: 14
AST: 18
Alkaline Phosphatase: 62
CO2: 28
Calcium: 9.4
GFR calc Af Amer: >60
GFR calc non Af Amer: >60
Glucose, Bld: 87
Potassium: 4.3
Sodium: 137
Total Protein: 6.8

## 2011-02-11 LAB — DIFFERENTIAL
Basophils Relative: 1
Eosinophils Absolute: 0.1
Eosinophils Relative: 2
Lymphs Abs: 2.6
Monocytes Relative: 6

## 2011-02-23 ENCOUNTER — Ambulatory Visit: Payer: Self-pay | Admitting: Internal Medicine

## 2011-03-06 ENCOUNTER — Ambulatory Visit: Payer: Self-pay | Admitting: Internal Medicine

## 2011-03-09 ENCOUNTER — Encounter: Payer: Self-pay | Admitting: Internal Medicine

## 2011-04-02 ENCOUNTER — Encounter: Payer: Self-pay | Admitting: Internal Medicine

## 2011-05-05 ENCOUNTER — Ambulatory Visit: Payer: Self-pay

## 2011-05-05 DIAGNOSIS — J40 Bronchitis, not specified as acute or chronic: Secondary | ICD-10-CM | POA: Diagnosis not present

## 2011-05-05 DIAGNOSIS — R197 Diarrhea, unspecified: Secondary | ICD-10-CM | POA: Diagnosis not present

## 2011-05-05 DIAGNOSIS — R05 Cough: Secondary | ICD-10-CM | POA: Diagnosis not present

## 2011-05-07 LAB — STOOL CULTURE

## 2011-05-16 DIAGNOSIS — M76829 Posterior tibial tendinitis, unspecified leg: Secondary | ICD-10-CM | POA: Diagnosis not present

## 2011-05-16 DIAGNOSIS — M79609 Pain in unspecified limb: Secondary | ICD-10-CM | POA: Diagnosis not present

## 2011-05-25 ENCOUNTER — Ambulatory Visit: Payer: Self-pay | Admitting: Podiatry

## 2011-05-25 DIAGNOSIS — M7989 Other specified soft tissue disorders: Secondary | ICD-10-CM | POA: Diagnosis not present

## 2011-05-25 DIAGNOSIS — M25579 Pain in unspecified ankle and joints of unspecified foot: Secondary | ICD-10-CM | POA: Diagnosis not present

## 2011-06-13 DIAGNOSIS — M79609 Pain in unspecified limb: Secondary | ICD-10-CM | POA: Diagnosis not present

## 2011-06-13 DIAGNOSIS — M76829 Posterior tibial tendinitis, unspecified leg: Secondary | ICD-10-CM | POA: Diagnosis not present

## 2011-06-20 DIAGNOSIS — M76829 Posterior tibial tendinitis, unspecified leg: Secondary | ICD-10-CM | POA: Diagnosis not present

## 2011-06-20 DIAGNOSIS — M79609 Pain in unspecified limb: Secondary | ICD-10-CM | POA: Diagnosis not present

## 2011-07-04 DIAGNOSIS — M76829 Posterior tibial tendinitis, unspecified leg: Secondary | ICD-10-CM | POA: Diagnosis not present

## 2011-07-04 DIAGNOSIS — M79609 Pain in unspecified limb: Secondary | ICD-10-CM | POA: Diagnosis not present

## 2011-07-13 ENCOUNTER — Ambulatory Visit: Payer: Self-pay | Admitting: Internal Medicine

## 2011-07-13 DIAGNOSIS — M549 Dorsalgia, unspecified: Secondary | ICD-10-CM | POA: Diagnosis not present

## 2011-07-13 LAB — URINALYSIS, COMPLETE
Bilirubin,UR: NEGATIVE
Blood: NEGATIVE
Leukocyte Esterase: NEGATIVE
Ph: 6.5 (ref 4.5–8.0)
Protein: NEGATIVE

## 2011-07-25 DIAGNOSIS — M76829 Posterior tibial tendinitis, unspecified leg: Secondary | ICD-10-CM | POA: Diagnosis not present

## 2011-07-25 DIAGNOSIS — M79609 Pain in unspecified limb: Secondary | ICD-10-CM | POA: Diagnosis not present

## 2011-09-27 ENCOUNTER — Emergency Department: Payer: Self-pay | Admitting: Emergency Medicine

## 2011-09-27 ENCOUNTER — Ambulatory Visit: Payer: Medicare Other | Admitting: Internal Medicine

## 2011-09-27 DIAGNOSIS — R062 Wheezing: Secondary | ICD-10-CM | POA: Diagnosis not present

## 2011-09-27 DIAGNOSIS — I10 Essential (primary) hypertension: Secondary | ICD-10-CM | POA: Diagnosis not present

## 2011-09-27 DIAGNOSIS — F17200 Nicotine dependence, unspecified, uncomplicated: Secondary | ICD-10-CM | POA: Diagnosis not present

## 2011-09-27 DIAGNOSIS — J209 Acute bronchitis, unspecified: Secondary | ICD-10-CM | POA: Diagnosis not present

## 2011-09-27 LAB — CBC
MCH: 28 pg (ref 26.0–34.0)
Platelet: 203 10*3/uL (ref 150–440)
RBC: 4.02 10*6/uL (ref 3.80–5.20)
WBC: 6.5 10*3/uL (ref 3.6–11.0)

## 2011-09-28 DIAGNOSIS — J40 Bronchitis, not specified as acute or chronic: Secondary | ICD-10-CM | POA: Diagnosis not present

## 2011-09-28 DIAGNOSIS — L219 Seborrheic dermatitis, unspecified: Secondary | ICD-10-CM | POA: Diagnosis not present

## 2011-09-28 DIAGNOSIS — E876 Hypokalemia: Secondary | ICD-10-CM | POA: Diagnosis not present

## 2011-09-28 DIAGNOSIS — I10 Essential (primary) hypertension: Secondary | ICD-10-CM | POA: Diagnosis not present

## 2011-10-17 DIAGNOSIS — J40 Bronchitis, not specified as acute or chronic: Secondary | ICD-10-CM | POA: Diagnosis not present

## 2011-10-17 DIAGNOSIS — H109 Unspecified conjunctivitis: Secondary | ICD-10-CM | POA: Diagnosis not present

## 2011-11-14 DIAGNOSIS — R05 Cough: Secondary | ICD-10-CM | POA: Diagnosis not present

## 2012-01-11 DIAGNOSIS — F329 Major depressive disorder, single episode, unspecified: Secondary | ICD-10-CM | POA: Diagnosis not present

## 2012-05-07 ENCOUNTER — Ambulatory Visit: Payer: Self-pay

## 2012-05-07 DIAGNOSIS — R55 Syncope and collapse: Secondary | ICD-10-CM | POA: Diagnosis not present

## 2012-05-07 DIAGNOSIS — R079 Chest pain, unspecified: Secondary | ICD-10-CM | POA: Diagnosis not present

## 2012-05-07 DIAGNOSIS — S20219A Contusion of unspecified front wall of thorax, initial encounter: Secondary | ICD-10-CM | POA: Diagnosis not present

## 2012-05-07 DIAGNOSIS — M940 Chondrocostal junction syndrome [Tietze]: Secondary | ICD-10-CM | POA: Diagnosis not present

## 2012-05-07 DIAGNOSIS — M542 Cervicalgia: Secondary | ICD-10-CM | POA: Diagnosis not present

## 2012-05-07 LAB — COMPREHENSIVE METABOLIC PANEL
Alkaline Phosphatase: 79 U/L (ref 50–136)
Bilirubin,Total: 0.4 mg/dL (ref 0.2–1.0)
Calcium, Total: 9.4 mg/dL (ref 8.5–10.1)
Chloride: 102 mmol/L (ref 98–107)
Creatinine: 0.96 mg/dL (ref 0.60–1.30)
EGFR (African American): >60
EGFR (Non-African Amer.): >60
SGOT(AST): 31 U/L (ref 15–37)
SGPT (ALT): 21 U/L (ref 12–78)
Sodium: 139 mmol/L (ref 136–145)

## 2012-05-07 LAB — CBC WITH DIFFERENTIAL/PLATELET
Basophil #: 0.1 10*3/uL (ref 0.0–0.1)
Basophil %: 1.2 %
Eosinophil #: 0.1 10*3/uL (ref 0.0–0.7)
HCT: 34.4 % — ABNORMAL LOW (ref 35.0–47.0)
HGB: 11.2 g/dL — ABNORMAL LOW (ref 12.0–16.0)
Lymphocyte #: 2.3 10*3/uL (ref 1.0–3.6)
Lymphocyte %: 35.5 %
MCHC: 32.6 g/dL (ref 32.0–36.0)
MCV: 81 fL (ref 80–100)
Monocyte #: 0.4 x10 3/mm (ref 0.2–0.9)
Monocyte %: 6.8 %
Neutrophil #: 3.6 10*3/uL (ref 1.4–6.5)
RDW: 15.8 % — ABNORMAL HIGH (ref 11.5–14.5)

## 2012-05-08 ENCOUNTER — Telehealth: Payer: Self-pay | Admitting: *Deleted

## 2012-05-08 ENCOUNTER — Encounter (INDEPENDENT_AMBULATORY_CARE_PROVIDER_SITE_OTHER): Payer: Medicare Other

## 2012-05-08 ENCOUNTER — Encounter: Payer: Self-pay | Admitting: Nurse Practitioner

## 2012-05-08 ENCOUNTER — Ambulatory Visit (INDEPENDENT_AMBULATORY_CARE_PROVIDER_SITE_OTHER): Payer: Medicare Other | Admitting: Nurse Practitioner

## 2012-05-08 ENCOUNTER — Other Ambulatory Visit: Payer: Self-pay

## 2012-05-08 VITALS — BP 112/76 | HR 59 | Ht 64.0 in | Wt 290.0 lb

## 2012-05-08 DIAGNOSIS — R55 Syncope and collapse: Secondary | ICD-10-CM

## 2012-05-08 DIAGNOSIS — R9431 Abnormal electrocardiogram [ECG] [EKG]: Secondary | ICD-10-CM

## 2012-05-08 DIAGNOSIS — R079 Chest pain, unspecified: Secondary | ICD-10-CM

## 2012-05-08 DIAGNOSIS — R42 Dizziness and giddiness: Secondary | ICD-10-CM

## 2012-05-08 LAB — BASIC METABOLIC PANEL
BUN: 19 mg/dL (ref 6–23)
CO2: 31 mEq/L (ref 19–32)
Calcium: 9 mg/dL (ref 8.4–10.5)
Chloride: 100 mEq/L (ref 96–112)
Creatinine, Ser: 1.1 mg/dL (ref 0.4–1.2)
GFR: 71.66 mL/min (ref >60.00)
Glucose, Bld: 93 mg/dL (ref 70–99)
Potassium: 3.5 mEq/L (ref 3.5–5.1)
Sodium: 137 mEq/L (ref 135–145)

## 2012-05-08 LAB — MAGNESIUM: Magnesium: 2 mg/dL (ref 1.5–2.5)

## 2012-05-08 MED ORDER — POTASSIUM CHLORIDE CRYS ER 20 MEQ PO TBCR: 20.0000 meq | EXTENDED_RELEASE_TABLET | Freq: Every day | ORAL | Status: DC

## 2012-05-08 NOTE — Patient Instructions (Addendum)
We are going to check labs today  We are going to place a heart monitor today to watch your rhythm for the next month  For now, stay on your current medicines  See Dr. Graciela Husbands in 5 weeks for further discussion  Call the [facility] office at [phone] if you have any questions, problems or concerns.
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
## 2012-05-08 NOTE — Telephone Encounter (Signed)
Event monitor placed on Pt 05/08/12 TK 

## 2012-05-10 DIAGNOSIS — R42 Dizziness and giddiness: Secondary | ICD-10-CM | POA: Diagnosis not present

## 2012-05-10 DIAGNOSIS — R55 Syncope and collapse: Secondary | ICD-10-CM | POA: Diagnosis not present

## 2012-05-13 ENCOUNTER — Emergency Department: Payer: Self-pay | Admitting: Emergency Medicine

## 2012-05-13 DIAGNOSIS — I10 Essential (primary) hypertension: Secondary | ICD-10-CM | POA: Diagnosis not present

## 2012-05-13 DIAGNOSIS — R6889 Other general symptoms and signs: Secondary | ICD-10-CM | POA: Diagnosis not present

## 2012-05-13 DIAGNOSIS — R42 Dizziness and giddiness: Secondary | ICD-10-CM | POA: Diagnosis not present

## 2012-05-13 DIAGNOSIS — R569 Unspecified convulsions: Secondary | ICD-10-CM | POA: Diagnosis not present

## 2012-05-13 DIAGNOSIS — K219 Gastro-esophageal reflux disease without esophagitis: Secondary | ICD-10-CM | POA: Diagnosis not present

## 2012-05-13 DIAGNOSIS — Z79899 Other long term (current) drug therapy: Secondary | ICD-10-CM | POA: Diagnosis not present

## 2012-05-13 LAB — COMPREHENSIVE METABOLIC PANEL
Albumin: 3.9 g/dL (ref 3.4–5.0)
Anion Gap: 8 (ref 7–16)
BUN: 20 mg/dL — ABNORMAL HIGH (ref 7–18)
Bilirubin,Total: 0.3 mg/dL (ref 0.2–1.0)
Chloride: 106 mmol/L (ref 98–107)
Co2: 26 mmol/L (ref 21–32)
Creatinine: 1.24 mg/dL (ref 0.60–1.30)
Glucose: 83 mg/dL (ref 65–99)
Osmolality: 281 (ref 275–301)
Potassium: 3.6 mmol/L (ref 3.5–5.1)
SGPT (ALT): 18 U/L (ref 12–78)
Sodium: 140 mmol/L (ref 136–145)
Total Protein: 8.1 g/dL (ref 6.4–8.2)

## 2012-05-13 LAB — CBC
HGB: 11.2 g/dL — ABNORMAL LOW (ref 12.0–16.0)
MCHC: 32.2 g/dL (ref 32.0–36.0)
MCV: 83 fL (ref 80–100)
RBC: 4.21 10*6/uL (ref 3.80–5.20)

## 2012-05-14 ENCOUNTER — Telehealth: Payer: Self-pay | Admitting: Internal Medicine

## 2012-05-14 LAB — PHOSPHORUS: Phosphorus: 4.2 mg/dL (ref 2.5–4.9)

## 2012-05-14 LAB — MAGNESIUM: Magnesium: 1.9 mg/dL

## 2012-05-14 NOTE — Telephone Encounter (Signed)
Spoke with patient she stated she was discharged from [facility] hospital today and wanted to make appointment with Dr.Klein at the [facility] office.Patient was told she will need to call [facility] office.

## 2012-05-14 NOTE — Telephone Encounter (Signed)
Pt states she was admitted to [facility] overnight.  She states she was discharged this am.  She states the hospital told her to see Dr Graciela Husbands when she leaves the hospital.  She wants to be seen at the [facility] office.  I told her I would have Dr Odessa Fleming nurse give her a call and let her know when Dr Graciela Husbands wants her to come in to the office.

## 2012-05-14 NOTE — Telephone Encounter (Signed)
G please arrange sooner rather than later followup thanks

## 2012-05-14 NOTE — Telephone Encounter (Signed)
New Problem:    Patient called in wanting to know when Dr. Graciela Husbands wanted to see her next.  Please call back.

## 2012-05-14 NOTE — Telephone Encounter (Signed)
New Problem:     Patient called in wanting to be see by Dr. Graciela Husbands this week.  Please call back.

## 2012-05-15 ENCOUNTER — Ambulatory Visit: Payer: Medicare Other | Admitting: Internal Medicine

## 2012-05-15 NOTE — Telephone Encounter (Signed)
I spoke with the patient today. I offered her follow up with Dr. Graciela Husbands tomorrow. She stated she could not come tomorrow. The first I have available is 1/27 with Dr. Graciela Husbands. She is agreeable with this.

## 2012-05-22 ENCOUNTER — Other Ambulatory Visit: Payer: Medicare Other

## 2012-05-28 ENCOUNTER — Ambulatory Visit: Payer: Medicare Other | Admitting: Internal Medicine

## 2012-06-07 DIAGNOSIS — R1032 Left lower quadrant pain: Secondary | ICD-10-CM | POA: Diagnosis not present

## 2012-06-07 DIAGNOSIS — R109 Unspecified abdominal pain: Secondary | ICD-10-CM | POA: Diagnosis not present

## 2012-06-07 DIAGNOSIS — K219 Gastro-esophageal reflux disease without esophagitis: Secondary | ICD-10-CM | POA: Diagnosis not present

## 2012-06-07 DIAGNOSIS — R198 Other specified symptoms and signs involving the digestive system and abdomen: Secondary | ICD-10-CM | POA: Diagnosis not present

## 2012-06-21 DIAGNOSIS — D649 Anemia, unspecified: Secondary | ICD-10-CM | POA: Diagnosis not present

## 2012-06-22 ENCOUNTER — Encounter: Payer: Self-pay | Admitting: *Deleted

## 2012-06-22 ENCOUNTER — Encounter: Payer: Self-pay | Admitting: Internal Medicine

## 2012-06-22 ENCOUNTER — Ambulatory Visit (INDEPENDENT_AMBULATORY_CARE_PROVIDER_SITE_OTHER): Payer: Medicare Other | Admitting: Internal Medicine

## 2012-06-22 VITALS — BP 110/80 | HR 66 | Ht 65.0 in | Wt 281.4 lb

## 2012-06-22 DIAGNOSIS — R42 Dizziness and giddiness: Secondary | ICD-10-CM | POA: Insufficient documentation

## 2012-06-22 DIAGNOSIS — Q898 Other specified congenital malformations: Secondary | ICD-10-CM

## 2012-06-22 DIAGNOSIS — I10 Essential (primary) hypertension: Secondary | ICD-10-CM | POA: Insufficient documentation

## 2012-06-22 DIAGNOSIS — I4581 Long QT syndrome: Secondary | ICD-10-CM

## 2012-06-22 MED ORDER — PROPRANOLOL HCL ER 60 MG PO CP24: 60.0000 mg | ORAL_CAPSULE | Freq: Every day | ORAL | Status: DC

## 2012-06-22 NOTE — Assessment & Plan Note (Signed)
Stable  Will decrease meds given BP 110 or so and orthostatic dizziness

## 2012-06-22 NOTE — Assessment & Plan Note (Addendum)
We will decrease the inderal and maxzide   She will need to get Cr and K checked   Event recorder was reviewed demonstrating no arrhythmias with her dizziness. She did have isolated PVCs

## 2012-06-22 NOTE — Patient Instructions (Signed)
Your physician has recommended you make the following change in your medication:  1) Decrease propranolol (inderal) to 60 mg once daily. 2) Decrease triameterene-hctz (maxide) to 75/50 mg 1/2 tablet by mouth once daily.   Your physician wants you to follow-up in: 1 year with Dr. Graciela Husbands. You will receive a reminder letter in the mail two months in advance. If you don't receive a letter, please call our office to schedule the follow-up appointment.

## 2012-06-22 NOTE — Progress Notes (Signed)
Patient Care Team: Bari Edward, MD as PCP - General (Internal Medicine) Bari Edward, MD (Internal Medicine)   HPI  [name] is a 47 y.o. 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 She takes Inderal. She has had a history of atypical chest pain. She underwent Myoview scanning 2011 which demonstrated no ischemia and normal left ventricular function  The patient denies chest pain, but does have complaints of orthostatic dizziness.    Past Medical History  Diagnosis Date  . Prolonged QT interval     gene positive  . HTN (hypertension)   . Atypical chest pain     negative Myoview in 2011 with no ischemi and normal LV function.   . Obesity   . OSA (obstructive sleep apnea)     Past Surgical History  Procedure Laterality Date  . Total hip arthroplasty    . Lumbar disc surgery    . Left foot surgery    . Left hand surgery x 2    . Right hand surgery for torn ligament      Current Outpatient Prescriptions  Medication Sig Dispense Refill  . aspirin 81 MG tablet Take 81 mg by mouth daily.        Marland Kitchen escitalopram (LEXAPRO) 10 MG tablet Take 20 mg by mouth daily.       . Glucosamine-Chondroit-Vit C-Mn (GLUCOSAMINE 1500 COMPLEX) CAPS Take 1 each by mouth daily.      . nabumetone (RELAFEN) 500 MG tablet Take 750 mg by mouth 2 (two) times daily.       . Omega-3 Fatty Acids (FISH OIL) 1000 MG CAPS Take 1 capsule by mouth 3 (three) times daily.        Marland Kitchen omeprazole (PRILOSEC) 20 MG capsule Take 20 mg by mouth daily.        . potassium chloride SA (K-DUR,KLOR-CON) 20 MEQ tablet Take 1 tablet (20 mEq total) by mouth daily.  30 tablet  3  . propranolol (INDERAL) 80 MG tablet Take 80 mg by mouth daily.        Marland Kitchen triamterene-hydrochlorothiazide (MAXZIDE) 75-50 MG per tablet Take 1 tablet by mouth daily.      Marland Kitchen zolpidem (AMBIEN) 10 MG tablet Take 10 mg by mouth at bedtime as needed.         No current facility-administered medications  for this visit.    No Known Allergies  Review of Systems negative except from HPI and PMH  Physical Exam BP 110/80  Pulse 66  Ht 5\' 5"  (1.651 m)  Wt 281 lb 6.4 oz (127.642 kg)  BMI 46.83 kg/m2 Well developed and well nourished in no acute distress HENT normal E scleral and icterus clear Neck Supple JVP flat; carotids brisk and full Clear to ausculation  Regular rate and rhythm, no murmurs gallops or rub Soft with active bowel sounds No clubbing cyanosis none Edema Alert and oriented, grossly normal motor and sensory function Skin Warm and Dry    Assessment and  Plan

## 2012-06-22 NOTE — Assessment & Plan Note (Signed)
Follow

## 2012-06-30 LAB — HM COLONOSCOPY: HM Colonoscopy: NORMAL

## 2012-07-10 ENCOUNTER — Emergency Department: Payer: Self-pay | Admitting: Emergency Medicine

## 2012-07-10 DIAGNOSIS — Z96649 Presence of unspecified artificial hip joint: Secondary | ICD-10-CM | POA: Diagnosis not present

## 2012-07-10 DIAGNOSIS — R6889 Other general symptoms and signs: Secondary | ICD-10-CM | POA: Diagnosis not present

## 2012-07-10 DIAGNOSIS — Z79899 Other long term (current) drug therapy: Secondary | ICD-10-CM | POA: Diagnosis not present

## 2012-07-10 DIAGNOSIS — R252 Cramp and spasm: Secondary | ICD-10-CM | POA: Diagnosis not present

## 2012-07-10 DIAGNOSIS — I10 Essential (primary) hypertension: Secondary | ICD-10-CM | POA: Diagnosis not present

## 2012-07-10 DIAGNOSIS — Z7982 Long term (current) use of aspirin: Secondary | ICD-10-CM | POA: Diagnosis not present

## 2012-07-10 LAB — CBC WITH DIFFERENTIAL/PLATELET
Basophil %: 1.1 %
Eosinophil %: 1.9 %
HGB: 10.5 g/dL — ABNORMAL LOW (ref 12.0–16.0)
Lymphocyte %: 35 %
MCV: 82 fL (ref 80–100)
Monocyte #: 0.5 x10 3/mm (ref 0.2–0.9)
Monocyte %: 7.6 %
Neutrophil %: 54.4 %
Platelet: 247 10*3/uL (ref 150–440)
RBC: 3.98 10*6/uL (ref 3.80–5.20)
WBC: 7.2 10*3/uL (ref 3.6–11.0)

## 2012-07-10 LAB — COMPREHENSIVE METABOLIC PANEL
Albumin: 3.3 g/dL — ABNORMAL LOW (ref 3.4–5.0)
Alkaline Phosphatase: 75 U/L (ref 50–136)
Anion Gap: 5 — ABNORMAL LOW (ref 7–16)
BUN: 20 mg/dL — ABNORMAL HIGH (ref 7–18)
Bilirubin,Total: 0.3 mg/dL (ref 0.2–1.0)
Chloride: 104 mmol/L (ref 98–107)
Co2: 30 mmol/L (ref 21–32)
EGFR (African American): >60
EGFR (Non-African Amer.): >60
Osmolality: 280 (ref 275–301)
Potassium: 3.1 mmol/L — ABNORMAL LOW (ref 3.5–5.1)
SGOT(AST): 20 U/L (ref 15–37)
SGPT (ALT): 18 U/L (ref 12–78)
Sodium: 139 mmol/L (ref 136–145)
Total Protein: 7.3 g/dL (ref 6.4–8.2)

## 2012-07-10 LAB — CK: CK, Total: 177 U/L (ref 21–215)

## 2012-07-12 ENCOUNTER — Ambulatory Visit: Payer: Self-pay | Admitting: Gastroenterology

## 2012-07-12 DIAGNOSIS — M1990 Unspecified osteoarthritis, unspecified site: Secondary | ICD-10-CM | POA: Diagnosis not present

## 2012-07-12 DIAGNOSIS — Z803 Family history of malignant neoplasm of breast: Secondary | ICD-10-CM | POA: Diagnosis not present

## 2012-07-12 DIAGNOSIS — Z7982 Long term (current) use of aspirin: Secondary | ICD-10-CM | POA: Diagnosis not present

## 2012-07-12 DIAGNOSIS — F329 Major depressive disorder, single episode, unspecified: Secondary | ICD-10-CM | POA: Diagnosis not present

## 2012-07-12 DIAGNOSIS — K219 Gastro-esophageal reflux disease without esophagitis: Secondary | ICD-10-CM | POA: Diagnosis not present

## 2012-07-12 DIAGNOSIS — I4581 Long QT syndrome: Secondary | ICD-10-CM | POA: Diagnosis not present

## 2012-07-12 DIAGNOSIS — Z800 Family history of malignant neoplasm of digestive organs: Secondary | ICD-10-CM | POA: Diagnosis not present

## 2012-07-12 DIAGNOSIS — Z79899 Other long term (current) drug therapy: Secondary | ICD-10-CM | POA: Diagnosis not present

## 2012-07-12 DIAGNOSIS — R109 Unspecified abdominal pain: Secondary | ICD-10-CM | POA: Diagnosis not present

## 2012-07-12 DIAGNOSIS — Z6841 Body Mass Index (BMI) 40.0 and over, adult: Secondary | ICD-10-CM | POA: Diagnosis not present

## 2012-07-12 DIAGNOSIS — R198 Other specified symptoms and signs involving the digestive system and abdomen: Secondary | ICD-10-CM | POA: Diagnosis not present

## 2012-07-12 DIAGNOSIS — Z87891 Personal history of nicotine dependence: Secondary | ICD-10-CM | POA: Diagnosis not present

## 2012-07-12 DIAGNOSIS — R634 Abnormal weight loss: Secondary | ICD-10-CM | POA: Diagnosis not present

## 2012-07-12 DIAGNOSIS — R1032 Left lower quadrant pain: Secondary | ICD-10-CM | POA: Diagnosis not present

## 2012-07-12 DIAGNOSIS — Z86010 Personal history of colonic polyps: Secondary | ICD-10-CM | POA: Diagnosis not present

## 2012-07-24 DIAGNOSIS — D649 Anemia, unspecified: Secondary | ICD-10-CM | POA: Diagnosis not present

## 2012-07-24 DIAGNOSIS — M62838 Other muscle spasm: Secondary | ICD-10-CM | POA: Diagnosis not present

## 2012-07-24 DIAGNOSIS — I4581 Long QT syndrome: Secondary | ICD-10-CM | POA: Diagnosis not present

## 2012-07-24 DIAGNOSIS — E876 Hypokalemia: Secondary | ICD-10-CM | POA: Diagnosis not present

## 2012-07-26 DIAGNOSIS — R1084 Generalized abdominal pain: Secondary | ICD-10-CM | POA: Diagnosis not present

## 2012-07-26 DIAGNOSIS — K219 Gastro-esophageal reflux disease without esophagitis: Secondary | ICD-10-CM | POA: Diagnosis not present

## 2012-07-26 DIAGNOSIS — K589 Irritable bowel syndrome without diarrhea: Secondary | ICD-10-CM | POA: Diagnosis not present

## 2012-07-30 DIAGNOSIS — K219 Gastro-esophageal reflux disease without esophagitis: Secondary | ICD-10-CM | POA: Diagnosis not present

## 2012-07-30 DIAGNOSIS — D649 Anemia, unspecified: Secondary | ICD-10-CM | POA: Diagnosis not present

## 2012-07-30 DIAGNOSIS — I32 Pericarditis in diseases classified elsewhere: Secondary | ICD-10-CM | POA: Diagnosis not present

## 2012-07-30 DIAGNOSIS — Z0000 Encounter for general adult medical examination without abnormal findings: Secondary | ICD-10-CM | POA: Diagnosis not present

## 2012-07-30 DIAGNOSIS — E785 Hyperlipidemia, unspecified: Secondary | ICD-10-CM | POA: Diagnosis not present

## 2012-07-30 DIAGNOSIS — F329 Major depressive disorder, single episode, unspecified: Secondary | ICD-10-CM | POA: Diagnosis not present

## 2012-07-31 DIAGNOSIS — R197 Diarrhea, unspecified: Secondary | ICD-10-CM | POA: Diagnosis not present

## 2012-07-31 DIAGNOSIS — K5730 Diverticulosis of large intestine without perforation or abscess without bleeding: Secondary | ICD-10-CM | POA: Diagnosis not present

## 2012-07-31 DIAGNOSIS — R109 Unspecified abdominal pain: Secondary | ICD-10-CM | POA: Diagnosis not present

## 2012-08-01 ENCOUNTER — Ambulatory Visit: Payer: Medicare Other | Admitting: Nurse Practitioner

## 2012-08-02 ENCOUNTER — Ambulatory Visit: Payer: Self-pay | Admitting: Internal Medicine

## 2012-08-02 DIAGNOSIS — R928 Other abnormal and inconclusive findings on diagnostic imaging of breast: Secondary | ICD-10-CM | POA: Diagnosis not present

## 2012-08-02 DIAGNOSIS — Z1231 Encounter for screening mammogram for malignant neoplasm of breast: Secondary | ICD-10-CM | POA: Diagnosis not present

## 2012-08-02 DIAGNOSIS — Z0000 Encounter for general adult medical examination without abnormal findings: Secondary | ICD-10-CM | POA: Diagnosis not present

## 2012-08-02 DIAGNOSIS — N946 Dysmenorrhea, unspecified: Secondary | ICD-10-CM | POA: Diagnosis not present

## 2012-08-02 DIAGNOSIS — I749 Embolism and thrombosis of unspecified artery: Secondary | ICD-10-CM | POA: Diagnosis not present

## 2012-08-02 DIAGNOSIS — N920 Excessive and frequent menstruation with regular cycle: Secondary | ICD-10-CM | POA: Diagnosis not present

## 2012-08-08 DIAGNOSIS — N920 Excessive and frequent menstruation with regular cycle: Secondary | ICD-10-CM | POA: Diagnosis not present

## 2012-08-08 DIAGNOSIS — N840 Polyp of corpus uteri: Secondary | ICD-10-CM | POA: Diagnosis not present

## 2012-08-08 DIAGNOSIS — D259 Leiomyoma of uterus, unspecified: Secondary | ICD-10-CM | POA: Diagnosis not present

## 2012-08-21 ENCOUNTER — Ambulatory Visit: Payer: Self-pay | Admitting: Obstetrics and Gynecology

## 2012-08-21 DIAGNOSIS — N83209 Unspecified ovarian cyst, unspecified side: Secondary | ICD-10-CM | POA: Diagnosis not present

## 2012-08-21 DIAGNOSIS — Z01812 Encounter for preprocedural laboratory examination: Secondary | ICD-10-CM | POA: Diagnosis not present

## 2012-08-21 DIAGNOSIS — N840 Polyp of corpus uteri: Secondary | ICD-10-CM | POA: Diagnosis not present

## 2012-08-21 LAB — BASIC METABOLIC PANEL
Anion Gap: 3 — ABNORMAL LOW (ref 7–16)
BUN: 15 mg/dL (ref 7–18)
Calcium, Total: 9.1 mg/dL (ref 8.5–10.1)
Co2: 32 mmol/L (ref 21–32)
EGFR (African American): >60
Glucose: 95 mg/dL (ref 65–99)
Osmolality: 276 (ref 275–301)
Potassium: 3.6 mmol/L (ref 3.5–5.1)
Sodium: 138 mmol/L (ref 136–145)

## 2012-08-21 LAB — CBC
HCT: 37 % (ref 35.0–47.0)
HGB: 12 g/dL (ref 12.0–16.0)
MCH: 27 pg (ref 26.0–34.0)
MCV: 83 fL (ref 80–100)
RDW: 18.9 % — ABNORMAL HIGH (ref 11.5–14.5)
WBC: 5.1 10*3/uL (ref 3.6–11.0)

## 2012-08-27 ENCOUNTER — Ambulatory Visit: Payer: Self-pay | Admitting: Obstetrics and Gynecology

## 2012-08-27 DIAGNOSIS — D259 Leiomyoma of uterus, unspecified: Secondary | ICD-10-CM | POA: Diagnosis not present

## 2012-08-27 DIAGNOSIS — Z803 Family history of malignant neoplasm of breast: Secondary | ICD-10-CM | POA: Diagnosis not present

## 2012-08-27 DIAGNOSIS — Z800 Family history of malignant neoplasm of digestive organs: Secondary | ICD-10-CM | POA: Diagnosis not present

## 2012-08-27 DIAGNOSIS — M1990 Unspecified osteoarthritis, unspecified site: Secondary | ICD-10-CM | POA: Diagnosis not present

## 2012-08-27 DIAGNOSIS — I498 Other specified cardiac arrhythmias: Secondary | ICD-10-CM | POA: Diagnosis not present

## 2012-08-27 DIAGNOSIS — N800 Endometriosis of uterus: Secondary | ICD-10-CM | POA: Diagnosis not present

## 2012-08-27 DIAGNOSIS — E669 Obesity, unspecified: Secondary | ICD-10-CM | POA: Diagnosis not present

## 2012-08-27 DIAGNOSIS — Z833 Family history of diabetes mellitus: Secondary | ICD-10-CM | POA: Diagnosis not present

## 2012-08-27 DIAGNOSIS — Z8489 Family history of other specified conditions: Secondary | ICD-10-CM | POA: Diagnosis not present

## 2012-08-27 DIAGNOSIS — N920 Excessive and frequent menstruation with regular cycle: Secondary | ICD-10-CM | POA: Diagnosis not present

## 2012-08-27 DIAGNOSIS — G4730 Sleep apnea, unspecified: Secondary | ICD-10-CM | POA: Diagnosis not present

## 2012-08-27 DIAGNOSIS — Z96649 Presence of unspecified artificial hip joint: Secondary | ICD-10-CM | POA: Diagnosis not present

## 2012-08-27 DIAGNOSIS — N950 Postmenopausal bleeding: Secondary | ICD-10-CM | POA: Diagnosis not present

## 2012-08-27 DIAGNOSIS — Z79899 Other long term (current) drug therapy: Secondary | ICD-10-CM | POA: Diagnosis not present

## 2012-08-27 DIAGNOSIS — K219 Gastro-esophageal reflux disease without esophagitis: Secondary | ICD-10-CM | POA: Diagnosis not present

## 2012-08-27 DIAGNOSIS — I10 Essential (primary) hypertension: Secondary | ICD-10-CM | POA: Diagnosis not present

## 2012-08-27 DIAGNOSIS — Z6841 Body Mass Index (BMI) 40.0 and over, adult: Secondary | ICD-10-CM | POA: Diagnosis not present

## 2012-08-27 DIAGNOSIS — N840 Polyp of corpus uteri: Secondary | ICD-10-CM | POA: Diagnosis not present

## 2012-08-28 ENCOUNTER — Ambulatory Visit: Payer: Self-pay | Admitting: Internal Medicine

## 2012-08-28 DIAGNOSIS — R928 Other abnormal and inconclusive findings on diagnostic imaging of breast: Secondary | ICD-10-CM | POA: Diagnosis not present

## 2012-08-28 DIAGNOSIS — N63 Unspecified lump in unspecified breast: Secondary | ICD-10-CM | POA: Diagnosis not present

## 2012-09-19 DIAGNOSIS — D259 Leiomyoma of uterus, unspecified: Secondary | ICD-10-CM | POA: Diagnosis not present

## 2012-09-19 DIAGNOSIS — N920 Excessive and frequent menstruation with regular cycle: Secondary | ICD-10-CM | POA: Diagnosis not present

## 2012-09-19 DIAGNOSIS — N840 Polyp of corpus uteri: Secondary | ICD-10-CM | POA: Diagnosis not present

## 2012-09-19 DIAGNOSIS — N946 Dysmenorrhea, unspecified: Secondary | ICD-10-CM | POA: Diagnosis not present

## 2012-11-07 ENCOUNTER — Emergency Department: Payer: Self-pay | Admitting: Emergency Medicine

## 2012-11-07 DIAGNOSIS — M546 Pain in thoracic spine: Secondary | ICD-10-CM | POA: Diagnosis not present

## 2012-11-07 DIAGNOSIS — M25519 Pain in unspecified shoulder: Secondary | ICD-10-CM | POA: Diagnosis not present

## 2012-11-07 DIAGNOSIS — R6889 Other general symptoms and signs: Secondary | ICD-10-CM | POA: Diagnosis not present

## 2012-11-07 DIAGNOSIS — R079 Chest pain, unspecified: Secondary | ICD-10-CM | POA: Diagnosis not present

## 2012-11-07 DIAGNOSIS — T148XXA Other injury of unspecified body region, initial encounter: Secondary | ICD-10-CM | POA: Diagnosis not present

## 2012-11-07 LAB — BASIC METABOLIC PANEL
Anion Gap: 8 (ref 7–16)
BUN: 13 mg/dL (ref 7–18)
Calcium, Total: 9 mg/dL (ref 8.5–10.1)
Chloride: 102 mmol/L (ref 98–107)
Co2: 27 mmol/L (ref 21–32)
Creatinine: 0.91 mg/dL (ref 0.60–1.30)
Glucose: 86 mg/dL (ref 65–99)
Osmolality: 273 (ref 275–301)
Potassium: 3.7 mmol/L (ref 3.5–5.1)

## 2012-11-07 LAB — CBC
HGB: 11.8 g/dL — ABNORMAL LOW (ref 12.0–16.0)
Platelet: 217 10*3/uL (ref 150–440)
RBC: 4.19 10*6/uL (ref 3.80–5.20)
RDW: 15.8 % — ABNORMAL HIGH (ref 11.5–14.5)
WBC: 5.7 10*3/uL (ref 3.6–11.0)

## 2012-12-17 DIAGNOSIS — N920 Excessive and frequent menstruation with regular cycle: Secondary | ICD-10-CM | POA: Diagnosis not present

## 2012-12-17 DIAGNOSIS — N840 Polyp of corpus uteri: Secondary | ICD-10-CM | POA: Diagnosis not present

## 2012-12-19 DIAGNOSIS — H53489 Generalized contraction of visual field, unspecified eye: Secondary | ICD-10-CM | POA: Diagnosis not present

## 2012-12-25 DIAGNOSIS — H5340 Unspecified visual field defects: Secondary | ICD-10-CM | POA: Diagnosis not present

## 2013-01-15 DIAGNOSIS — H5340 Unspecified visual field defects: Secondary | ICD-10-CM | POA: Diagnosis not present

## 2013-02-05 ENCOUNTER — Other Ambulatory Visit: Payer: Self-pay

## 2013-02-05 MED ORDER — POTASSIUM CHLORIDE CRYS ER 20 MEQ PO TBCR: 20.0000 meq | EXTENDED_RELEASE_TABLET | Freq: Every day | ORAL | Status: DC

## 2013-03-17 ENCOUNTER — Ambulatory Visit: Payer: Self-pay | Admitting: Physician Assistant

## 2013-03-17 DIAGNOSIS — IMO0001 Reserved for inherently not codable concepts without codable children: Secondary | ICD-10-CM | POA: Diagnosis not present

## 2013-03-17 DIAGNOSIS — I499 Cardiac arrhythmia, unspecified: Secondary | ICD-10-CM | POA: Diagnosis not present

## 2013-03-17 DIAGNOSIS — M76899 Other specified enthesopathies of unspecified lower limb, excluding foot: Secondary | ICD-10-CM | POA: Diagnosis not present

## 2013-03-17 DIAGNOSIS — M5412 Radiculopathy, cervical region: Secondary | ICD-10-CM | POA: Diagnosis not present

## 2013-03-17 DIAGNOSIS — Z79899 Other long term (current) drug therapy: Secondary | ICD-10-CM | POA: Diagnosis not present

## 2013-03-17 DIAGNOSIS — M1990 Unspecified osteoarthritis, unspecified site: Secondary | ICD-10-CM | POA: Diagnosis not present

## 2013-03-17 DIAGNOSIS — M5030 Other cervical disc degeneration, unspecified cervical region: Secondary | ICD-10-CM | POA: Diagnosis not present

## 2013-03-17 DIAGNOSIS — Z7982 Long term (current) use of aspirin: Secondary | ICD-10-CM | POA: Diagnosis not present

## 2013-03-17 DIAGNOSIS — Z96649 Presence of unspecified artificial hip joint: Secondary | ICD-10-CM | POA: Diagnosis not present

## 2013-04-03 DIAGNOSIS — M5412 Radiculopathy, cervical region: Secondary | ICD-10-CM | POA: Diagnosis not present

## 2013-04-03 DIAGNOSIS — E669 Obesity, unspecified: Secondary | ICD-10-CM | POA: Diagnosis not present

## 2013-04-03 DIAGNOSIS — M542 Cervicalgia: Secondary | ICD-10-CM | POA: Diagnosis not present

## 2013-04-15 ENCOUNTER — Other Ambulatory Visit: Payer: Self-pay | Admitting: Neurosurgery

## 2013-04-15 DIAGNOSIS — M5412 Radiculopathy, cervical region: Secondary | ICD-10-CM

## 2013-04-21 ENCOUNTER — Other Ambulatory Visit: Payer: Medicare Other

## 2013-04-30 ENCOUNTER — Ambulatory Visit
Admission: RE | Admit: 2013-04-30 | Discharge: 2013-04-30 | Disposition: A | Discharge status: Home / Self Care | Payer: Medicare Other | Source: Ambulatory Visit | Attending: Neurosurgery | Admitting: Neurosurgery

## 2013-04-30 DIAGNOSIS — M4802 Spinal stenosis, cervical region: Secondary | ICD-10-CM | POA: Diagnosis not present

## 2013-04-30 DIAGNOSIS — M5412 Radiculopathy, cervical region: Secondary | ICD-10-CM

## 2013-04-30 DIAGNOSIS — M47812 Spondylosis without myelopathy or radiculopathy, cervical region: Secondary | ICD-10-CM | POA: Diagnosis not present

## 2013-04-30 DIAGNOSIS — M5020 Other cervical disc displacement, unspecified cervical region: Secondary | ICD-10-CM | POA: Diagnosis not present

## 2013-06-14 DIAGNOSIS — M5412 Radiculopathy, cervical region: Secondary | ICD-10-CM | POA: Diagnosis not present

## 2013-06-14 DIAGNOSIS — E669 Obesity, unspecified: Secondary | ICD-10-CM | POA: Diagnosis not present

## 2013-06-24 ENCOUNTER — Encounter (HOSPITAL_COMMUNITY): Payer: Self-pay | Admitting: *Deleted

## 2013-06-24 ENCOUNTER — Other Ambulatory Visit: Payer: Self-pay | Admitting: Neurosurgery

## 2013-06-24 NOTE — Progress Notes (Signed)
Pt has hx of prolonged QT, sees Dr. Caryl Comes. Last visit was 06/22/12. Pt denies any recent sob or chest pain.

## 2013-06-24 NOTE — Progress Notes (Addendum)
Anesthesia Chart Review:  Patient is a 48 year old female posted for two level ACDF tomorrow (06/25/13) by Dr. Joya Salm.  She is scheduled to be a same day work-up.    History includes prolonged QT syndrome (with gene positive kindred), HTN, OSA with CPAP use, chronic DOE, anxiety, depression, GERD, RLE DVT (PT vein; '08), seizures (none in > 4 years), anemia, atypical chest pain in with normal myoview 01/19/09, malignant colon polyps, multiple surgeries including L5-S1 fusion '09. BMI is listed as 46.83 which correlates with morbid obesity. PCP is listed as Dr. Halina Maidens.  Cardiologist is Dr. Virl Axe, last visit 06/22/12. She is on Inderal for prolonged QT, and he is following.  She had an event monitor and medication adjustment due to orthostatic dizziness.  Nuclear stress test on 01/19/09 showed: Normal stress nuclear study, RV appeared dilated with significant dyspnea, EF 60%.  Event monitor in 05/2012 showed SR, no arrhythmia, isolated PVCs.  She denied any chest pain or recent SOB during her PAT phone interview.  She saw cardiology just over one year ago. She remains on Inderal. She is scheduled for EKG, CKR, and labs tomorrow on arrival.  If these are stable (ie, QT interval) then I would anticipate that she can proceed as planned. Her last QT/QTc on 05/08/12 were measured as 470/480 ms.  If any changes (QT progression) on EKG tomorrow then cardiology may have to be consulted to review. Case is currently posted to be Dr. Harley Hallmark second and last case tomorrow. Anesthesiologist Dr. Chriss Driver updated.  George Hugh [facility] Short Stay Center/Anesthesiology Phone [phone] 06/24/2013 12:24 PM

## 2013-06-24 NOTE — Progress Notes (Signed)
Chart given to [place], [place] to review.

## 2013-06-24 NOTE — H&P (Signed)
[name] is an 48 y.o. female.   Chief Complaint: neck pain [id number] seen with a history on cervical paoin with radiation to the left upper extremity which has beeb going on for almost one year, no better with conservative treatment.  Past Medical History  Diagnosis Date  . Prolonged QT interval     gene positive  . HTN (hypertension)   . Atypical chest pain     negative Myoview in 2011 with no ischemi and normal LV function.   . Obesity   . OSA (obstructive sleep apnea)     uses C-PAP  . Anxiety   . Depression   . Shortness of breath     with exertion  . Peripheral vascular disease     blood clot in right leg  . GERD (gastroesophageal reflux disease)     uses Nexium  . Seizures     hasn't had any for 4 years  . Arthritis     osteoarthritis  . Anemia   . Cancer     polyps were malignant per pt.    Past Surgical History  Procedure Laterality Date  . Total hip arthroplasty    . Lumbar disc surgery    . Left foot surgery    . Left hand surgery x 2    . Right hand surgery for torn ligament    . Colonoscopy    . Cardiac catheterization    . Dilation and curettage of uterus      History reviewed. No pertinent family history. Social History:  reports that she quit smoking about 11 years ago. She has never used smokeless tobacco. She reports that she does not drink alcohol or use illicit drugs.  Allergies: No Known Allergies  No prescriptions prior to admission    No results found for this or any previous visit (from the past 48 hour(s)). No results found.  ROS anxiety, depression, sob, arm weakness, high blood pressure  Last menstrual period 06/20/2013. Physical Exam hent, nl. Neck, pain with lateralization. Cv, nl. Lungs, some basal ronchii. Abdomen, soft. Extremities ,grade 1 edema. NEURO WEAKNESS OF left deltoits and triceps. Sensory, nl. 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  C56 is  normal  Assessment/Plan Patient wants to go ahead with decompression and fusion at c45 and 67. She is awre of risks and benefits  [name] 06/24/2013, 4:28 PM

## 2013-06-25 ENCOUNTER — Encounter (HOSPITAL_COMMUNITY)
Admission: RE | Disposition: A | Discharge status: Home / Self Care | Payer: Self-pay | Source: Ambulatory Visit | Attending: Neurosurgery

## 2013-06-25 ENCOUNTER — Ambulatory Visit (HOSPITAL_COMMUNITY): Payer: Medicare Other | Admitting: Vascular Surgery

## 2013-06-25 ENCOUNTER — Ambulatory Visit (HOSPITAL_COMMUNITY): Payer: Medicare Other

## 2013-06-25 ENCOUNTER — Encounter (HOSPITAL_COMMUNITY): Payer: Medicare Other | Admitting: Vascular Surgery

## 2013-06-25 ENCOUNTER — Encounter (HOSPITAL_COMMUNITY): Payer: Self-pay | Admitting: *Deleted

## 2013-06-25 ENCOUNTER — Inpatient Hospital Stay (HOSPITAL_COMMUNITY)
Admission: RE | Admit: 2013-06-25 | Discharge: 2013-06-26 | DRG: 472 | Disposition: A | Discharge status: Home / Self Care | Payer: Medicare Other | Source: Ambulatory Visit | Attending: Neurosurgery | Admitting: Neurosurgery

## 2013-06-25 DIAGNOSIS — G4733 Obstructive sleep apnea (adult) (pediatric): Secondary | ICD-10-CM | POA: Diagnosis present

## 2013-06-25 DIAGNOSIS — G4710 Hypersomnia, unspecified: Secondary | ICD-10-CM | POA: Diagnosis not present

## 2013-06-25 DIAGNOSIS — M1990 Unspecified osteoarthritis, unspecified site: Secondary | ICD-10-CM | POA: Diagnosis present

## 2013-06-25 DIAGNOSIS — F3289 Other specified depressive episodes: Secondary | ICD-10-CM | POA: Diagnosis present

## 2013-06-25 DIAGNOSIS — F329 Major depressive disorder, single episode, unspecified: Secondary | ICD-10-CM | POA: Diagnosis present

## 2013-06-25 DIAGNOSIS — I10 Essential (primary) hypertension: Secondary | ICD-10-CM | POA: Diagnosis present

## 2013-06-25 DIAGNOSIS — M4802 Spinal stenosis, cervical region: Secondary | ICD-10-CM | POA: Diagnosis present

## 2013-06-25 DIAGNOSIS — Z96649 Presence of unspecified artificial hip joint: Secondary | ICD-10-CM | POA: Diagnosis not present

## 2013-06-25 DIAGNOSIS — F411 Generalized anxiety disorder: Secondary | ICD-10-CM | POA: Diagnosis present

## 2013-06-25 DIAGNOSIS — I739 Peripheral vascular disease, unspecified: Secondary | ICD-10-CM | POA: Diagnosis present

## 2013-06-25 DIAGNOSIS — Z87891 Personal history of nicotine dependence: Secondary | ICD-10-CM

## 2013-06-25 DIAGNOSIS — M5030 Other cervical disc degeneration, unspecified cervical region: Secondary | ICD-10-CM | POA: Diagnosis present

## 2013-06-25 DIAGNOSIS — Z6841 Body Mass Index (BMI) 40.0 and over, adult: Secondary | ICD-10-CM

## 2013-06-25 DIAGNOSIS — M542 Cervicalgia: Secondary | ICD-10-CM | POA: Diagnosis not present

## 2013-06-25 DIAGNOSIS — M5020 Other cervical disc displacement, unspecified cervical region: Secondary | ICD-10-CM | POA: Diagnosis not present

## 2013-06-25 DIAGNOSIS — K219 Gastro-esophageal reflux disease without esophagitis: Secondary | ICD-10-CM | POA: Diagnosis not present

## 2013-06-25 DIAGNOSIS — M5090 Cervical disc disorder, unspecified, unspecified cervical region: Secondary | ICD-10-CM | POA: Diagnosis not present

## 2013-06-25 HISTORY — DX: Unspecified osteoarthritis, unspecified site: M19.90

## 2013-06-25 HISTORY — DX: Major depressive disorder, single episode, unspecified: F32.9

## 2013-06-25 HISTORY — DX: Anemia, unspecified: D64.9

## 2013-06-25 HISTORY — DX: Gastro-esophageal reflux disease without esophagitis: K21.9

## 2013-06-25 HISTORY — PX: ANTERIOR CERVICAL DECOMP/DISCECTOMY FUSION: SHX1161

## 2013-06-25 HISTORY — DX: Depression, unspecified: F32.A

## 2013-06-25 HISTORY — DX: Anxiety disorder, unspecified: F41.9

## 2013-06-25 HISTORY — DX: Peripheral vascular disease, unspecified: I73.9

## 2013-06-25 HISTORY — DX: Unspecified convulsions: R56.9

## 2013-06-25 LAB — BASIC METABOLIC PANEL
BUN: 16 mg/dL (ref 6–23)
CHLORIDE: 100 meq/L (ref 96–112)
CO2: 25 meq/L (ref 19–32)
Calcium: 9.4 mg/dL (ref 8.4–10.5)
Creatinine, Ser: 0.87 mg/dL (ref 0.50–1.10)
GFR calc Af Amer: >90 mL/min (ref >90)
GFR calc non Af Amer: 78 mL/min — ABNORMAL LOW (ref >90)
GLUCOSE: 96 mg/dL (ref 70–99)
POTASSIUM: 3.8 meq/L (ref 3.7–5.3)
SODIUM: 139 meq/L (ref 137–147)

## 2013-06-25 LAB — CBC
HEMATOCRIT: 38 % (ref 36.0–46.0)
HEMOGLOBIN: 12.5 g/dL (ref 12.0–15.0)
MCH: 28.5 pg (ref 26.0–34.0)
MCHC: 32.9 g/dL (ref 30.0–36.0)
MCV: 86.6 fL (ref 78.0–100.0)
Platelets: 267 10*3/uL (ref 150–400)
RBC: 4.39 MIL/uL (ref 3.87–5.11)
RDW: 13.6 % (ref 11.5–15.5)
WBC: 6.9 10*3/uL (ref 4.0–10.5)

## 2013-06-25 LAB — SURGICAL PCR SCREEN
MRSA, PCR: NEGATIVE
STAPHYLOCOCCUS AUREUS: NEGATIVE

## 2013-06-25 LAB — HCG, SERUM, QUALITATIVE: Preg, Serum: NEGATIVE

## 2013-06-25 SURGERY — ANTERIOR CERVICAL DECOMPRESSION/DISCECTOMY FUSION 2 LEVELS
Anesthesia: General

## 2013-06-25 MED ORDER — ACETAMINOPHEN 650 MG RE SUPP: 650.0000 mg | RECTAL | Status: DC | PRN

## 2013-06-25 MED ORDER — THROMBIN 5000 UNITS EX SOLR
OROMUCOSAL | Status: DC | PRN
  Administered 2013-06-25: 14:00:00 via TOPICAL

## 2013-06-25 MED ORDER — ROCURONIUM BROMIDE 50 MG/5ML IV SOLN
INTRAVENOUS | Status: AC
  Filled 2013-06-25: qty 1

## 2013-06-25 MED ORDER — ARTIFICIAL TEARS OP OINT
TOPICAL_OINTMENT | OPHTHALMIC | Status: DC | PRN
  Administered 2013-06-25: 1 via OPHTHALMIC

## 2013-06-25 MED ORDER — CEFAZOLIN SODIUM 1-5 GM-% IV SOLN
1.0000 g | Freq: Three times a day (TID) | INTRAVENOUS | Status: AC
  Administered 2013-06-25 – 2013-06-26 (×2): 1 g via INTRAVENOUS
  Filled 2013-06-25 (×2): qty 50

## 2013-06-25 MED ORDER — SODIUM CHLORIDE 0.9 % IJ SOLN: 3.0000 mL | INTRAMUSCULAR | Status: DC | PRN

## 2013-06-25 MED ORDER — MENTHOL 3 MG MT LOZG
1.0000 | LOZENGE | OROMUCOSAL | Status: DC | PRN
  Filled 2013-06-25: qty 9

## 2013-06-25 MED ORDER — FENTANYL CITRATE 0.05 MG/ML IJ SOLN
INTRAMUSCULAR | Status: AC
  Filled 2013-06-25: qty 5

## 2013-06-25 MED ORDER — TRIAMTERENE-HCTZ 75-50 MG PO TABS
1.0000 | ORAL_TABLET | Freq: Every day | ORAL | Status: DC
  Administered 2013-06-25: 1 via ORAL
  Filled 2013-06-25 (×2): qty 1

## 2013-06-25 MED ORDER — NEOSTIGMINE METHYLSULFATE 1 MG/ML IJ SOLN
INTRAMUSCULAR | Status: AC
  Filled 2013-06-25: qty 10

## 2013-06-25 MED ORDER — MUPIROCIN 2 % EX OINT
TOPICAL_OINTMENT | Freq: Two times a day (BID) | CUTANEOUS | Status: DC
  Administered 2013-06-25: 09:00:00 via NASAL

## 2013-06-25 MED ORDER — CEFAZOLIN SODIUM-DEXTROSE 2-3 GM-% IV SOLR
2.0000 g | Freq: Once | INTRAVENOUS | Status: AC
  Administered 2013-06-25: 2 g via INTRAVENOUS

## 2013-06-25 MED ORDER — THROMBIN 5000 UNITS EX SOLR
CUTANEOUS | Status: DC | PRN
  Administered 2013-06-25 (×2): 5000 [IU] via TOPICAL

## 2013-06-25 MED ORDER — GLYCOPYRROLATE 0.2 MG/ML IJ SOLN
INTRAMUSCULAR | Status: DC | PRN
  Administered 2013-06-25: 0.1 mg via INTRAVENOUS
  Administered 2013-06-25: .8 mg via INTRAVENOUS
  Administered 2013-06-25: 0.1 mg via INTRAVENOUS

## 2013-06-25 MED ORDER — CEFAZOLIN SODIUM-DEXTROSE 2-3 GM-% IV SOLR
INTRAVENOUS | Status: AC
  Filled 2013-06-25: qty 50

## 2013-06-25 MED ORDER — LACTATED RINGERS IV SOLN
INTRAVENOUS | Status: DC | PRN
  Administered 2013-06-25 (×2): via INTRAVENOUS

## 2013-06-25 MED ORDER — ZOLPIDEM TARTRATE 5 MG PO TABS
5.0000 mg | ORAL_TABLET | Freq: Every evening | ORAL | Status: DC | PRN
Start: 2013-06-25 — End: 2013-06-26

## 2013-06-25 MED ORDER — FENTANYL CITRATE 0.05 MG/ML IJ SOLN
INTRAMUSCULAR | Status: AC
Start: 2013-06-25 — End: 2013-06-25
  Filled 2013-06-25: qty 5

## 2013-06-25 MED ORDER — MEPERIDINE HCL 25 MG/ML IJ SOLN: 6.2500 mg | INTRAMUSCULAR | Status: DC | PRN

## 2013-06-25 MED ORDER — EPHEDRINE SULFATE 50 MG/ML IJ SOLN
INTRAMUSCULAR | Status: AC
  Filled 2013-06-25: qty 1

## 2013-06-25 MED ORDER — PHENOL 1.4 % MT LIQD
1.0000 | OROMUCOSAL | Status: DC | PRN
  Administered 2013-06-25: 1 via OROMUCOSAL
  Filled 2013-06-25: qty 177

## 2013-06-25 MED ORDER — PHENYLEPHRINE HCL 10 MG/ML IJ SOLN
10.0000 mg | INTRAVENOUS | Status: DC | PRN
  Administered 2013-06-25: 15 ug/min via INTRAVENOUS

## 2013-06-25 MED ORDER — PANTOPRAZOLE SODIUM 40 MG PO TBEC
40.0000 mg | DELAYED_RELEASE_TABLET | Freq: Every day | ORAL | Status: DC
  Filled 2013-06-25: qty 1

## 2013-06-25 MED ORDER — LIDOCAINE HCL 4 % MT SOLN
OROMUCOSAL | Status: DC | PRN
  Administered 2013-06-25: 4 mL via TOPICAL

## 2013-06-25 MED ORDER — NEOSTIGMINE METHYLSULFATE 1 MG/ML IJ SOLN
INTRAMUSCULAR | Status: DC | PRN
  Administered 2013-06-25: 4 mg via INTRAVENOUS

## 2013-06-25 MED ORDER — SODIUM CHLORIDE 0.9 % IJ SOLN
3.0000 mL | Freq: Two times a day (BID) | INTRAMUSCULAR | Status: DC
  Administered 2013-06-25: 3 mL via INTRAVENOUS

## 2013-06-25 MED ORDER — PANTOPRAZOLE SODIUM 40 MG PO TBEC: 40.0000 mg | DELAYED_RELEASE_TABLET | Freq: Every day | ORAL | Status: DC

## 2013-06-25 MED ORDER — OXYCODONE HCL 5 MG PO TABS
ORAL_TABLET | ORAL | Status: AC
  Filled 2013-06-25: qty 1

## 2013-06-25 MED ORDER — MUPIROCIN 2 % EX OINT
TOPICAL_OINTMENT | CUTANEOUS | Status: AC
  Filled 2013-06-25: qty 22

## 2013-06-25 MED ORDER — OXYCODONE HCL 5 MG/5ML PO SOLN: 5.0000 mg | Freq: Once | ORAL | Status: DC | PRN

## 2013-06-25 MED ORDER — OXYCODONE HCL 5 MG PO TABS
5.0000 mg | ORAL_TABLET | Freq: Once | ORAL | Status: AC | PRN
  Administered 2013-06-25: 5 mg via ORAL

## 2013-06-25 MED ORDER — DEXAMETHASONE 4 MG PO TABS
4.0000 mg | ORAL_TABLET | Freq: Four times a day (QID) | ORAL | Status: DC
  Administered 2013-06-25 – 2013-06-26 (×3): 4 mg via ORAL
  Filled 2013-06-25 (×7): qty 1

## 2013-06-25 MED ORDER — ONDANSETRON HCL 4 MG/2ML IJ SOLN: 4.0000 mg | Freq: Once | INTRAMUSCULAR | Status: DC | PRN

## 2013-06-25 MED ORDER — GLYCOPYRROLATE 0.2 MG/ML IJ SOLN
INTRAMUSCULAR | Status: AC
Start: 2013-06-25 — End: 2013-06-25
  Filled 2013-06-25: qty 1

## 2013-06-25 MED ORDER — HYDROMORPHONE HCL PF 1 MG/ML IJ SOLN
INTRAMUSCULAR | Status: AC
  Filled 2013-06-25: qty 1

## 2013-06-25 MED ORDER — FENTANYL CITRATE 0.05 MG/ML IJ SOLN
INTRAMUSCULAR | Status: DC | PRN
  Administered 2013-06-25 (×4): 50 ug via INTRAVENOUS
  Administered 2013-06-25: 100 ug via INTRAVENOUS
  Administered 2013-06-25 (×2): 50 ug via INTRAVENOUS

## 2013-06-25 MED ORDER — STERILE WATER FOR INJECTION IJ SOLN
INTRAMUSCULAR | Status: AC
  Filled 2013-06-25: qty 10

## 2013-06-25 MED ORDER — HEMOSTATIC AGENTS (NO CHARGE) OPTIME
TOPICAL | Status: DC | PRN
  Administered 2013-06-25: 1 via TOPICAL

## 2013-06-25 MED ORDER — DEXAMETHASONE SODIUM PHOSPHATE 10 MG/ML IJ SOLN
INTRAMUSCULAR | Status: AC
  Administered 2013-06-25: 10 mg via INTRAVENOUS
  Filled 2013-06-25: qty 1

## 2013-06-25 MED ORDER — PROPOFOL 10 MG/ML IV BOLUS
INTRAVENOUS | Status: DC | PRN
  Administered 2013-06-25: 40 mg via INTRAVENOUS
  Administered 2013-06-25: 160 mg via INTRAVENOUS

## 2013-06-25 MED ORDER — HYDROMORPHONE HCL PF 1 MG/ML IJ SOLN
0.2500 mg | INTRAMUSCULAR | Status: DC | PRN
  Administered 2013-06-25 (×4): 0.5 mg via INTRAVENOUS

## 2013-06-25 MED ORDER — OXYCODONE HCL 5 MG/5ML PO SOLN: 5.0000 mg | Freq: Once | ORAL | Status: AC | PRN

## 2013-06-25 MED ORDER — LIDOCAINE HCL (CARDIAC) 20 MG/ML IV SOLN
INTRAVENOUS | Status: AC
  Filled 2013-06-25: qty 5

## 2013-06-25 MED ORDER — ARTIFICIAL TEARS OP OINT
TOPICAL_OINTMENT | OPHTHALMIC | Status: AC
  Filled 2013-06-25: qty 3.5

## 2013-06-25 MED ORDER — LACTATED RINGERS IV SOLN
INTRAVENOUS | Status: DC
  Administered 2013-06-25: 09:00:00 via INTRAVENOUS

## 2013-06-25 MED ORDER — ONDANSETRON HCL 4 MG/2ML IJ SOLN
INTRAMUSCULAR | Status: AC
  Filled 2013-06-25: qty 2

## 2013-06-25 MED ORDER — DEXAMETHASONE SODIUM PHOSPHATE 10 MG/ML IJ SOLN
INTRAMUSCULAR | Status: AC
  Filled 2013-06-25: qty 1

## 2013-06-25 MED ORDER — PROPRANOLOL HCL ER 60 MG PO CP24
60.0000 mg | ORAL_CAPSULE | Freq: Every day | ORAL | Status: DC
  Filled 2013-06-25: qty 1

## 2013-06-25 MED ORDER — SODIUM CHLORIDE 0.9 % IV SOLN: 250.0000 mL | INTRAVENOUS | Status: DC

## 2013-06-25 MED ORDER — DIAZEPAM 5 MG PO TABS
5.0000 mg | ORAL_TABLET | Freq: Four times a day (QID) | ORAL | Status: DC | PRN
  Administered 2013-06-25 (×2): 5 mg via ORAL
  Filled 2013-06-25 (×2): qty 1

## 2013-06-25 MED ORDER — ONDANSETRON HCL 4 MG/2ML IJ SOLN: 4.0000 mg | INTRAMUSCULAR | Status: DC | PRN

## 2013-06-25 MED ORDER — OXYCODONE HCL 5 MG PO TABS: 5.0000 mg | ORAL_TABLET | Freq: Once | ORAL | Status: DC | PRN

## 2013-06-25 MED ORDER — ACETAMINOPHEN 325 MG PO TABS: 650.0000 mg | ORAL_TABLET | ORAL | Status: DC | PRN

## 2013-06-25 MED ORDER — MIDAZOLAM HCL 5 MG/5ML IJ SOLN
INTRAMUSCULAR | Status: DC | PRN
  Administered 2013-06-25: 2 mg via INTRAVENOUS

## 2013-06-25 MED ORDER — EPHEDRINE SULFATE 50 MG/ML IJ SOLN
INTRAMUSCULAR | Status: DC | PRN
  Administered 2013-06-25 (×3): 5 mg via INTRAVENOUS

## 2013-06-25 MED ORDER — OXYCODONE-ACETAMINOPHEN 5-325 MG PO TABS
1.0000 | ORAL_TABLET | ORAL | Status: DC | PRN
  Administered 2013-06-25 – 2013-06-26 (×4): 2 via ORAL
  Filled 2013-06-25 (×4): qty 2

## 2013-06-25 MED ORDER — DEXAMETHASONE SODIUM PHOSPHATE 4 MG/ML IJ SOLN
4.0000 mg | Freq: Four times a day (QID) | INTRAMUSCULAR | Status: DC
  Filled 2013-06-25 (×4): qty 1

## 2013-06-25 MED ORDER — ROCURONIUM BROMIDE 100 MG/10ML IV SOLN
INTRAVENOUS | Status: DC | PRN
  Administered 2013-06-25: 10 mg via INTRAVENOUS
  Administered 2013-06-25: 50 mg via INTRAVENOUS
  Administered 2013-06-25: 20 mg via INTRAVENOUS

## 2013-06-25 MED ORDER — PROPOFOL 10 MG/ML IV BOLUS
INTRAVENOUS | Status: AC
  Filled 2013-06-25: qty 20

## 2013-06-25 MED ORDER — GLYCOPYRROLATE 0.2 MG/ML IJ SOLN
INTRAMUSCULAR | Status: AC
  Filled 2013-06-25: qty 3

## 2013-06-25 MED ORDER — HYDROMORPHONE HCL PF 1 MG/ML IJ SOLN: 0.2500 mg | INTRAMUSCULAR | Status: DC | PRN

## 2013-06-25 MED ORDER — DIPHENHYDRAMINE HCL 25 MG PO CAPS
25.0000 mg | ORAL_CAPSULE | Freq: Four times a day (QID) | ORAL | Status: DC | PRN
  Filled 2013-06-25: qty 1

## 2013-06-25 MED ORDER — MORPHINE SULFATE 2 MG/ML IJ SOLN
INTRAMUSCULAR | Status: AC
  Filled 2013-06-25: qty 1

## 2013-06-25 MED ORDER — 0.9 % SODIUM CHLORIDE (POUR BTL) OPTIME
TOPICAL | Status: DC | PRN
  Administered 2013-06-25: 1000 mL

## 2013-06-25 MED ORDER — POTASSIUM CHLORIDE IN NACL 20-0.9 MEQ/L-% IV SOLN
INTRAVENOUS | Status: DC
  Filled 2013-06-25 (×3): qty 1000

## 2013-06-25 MED ORDER — MIDAZOLAM HCL 2 MG/2ML IJ SOLN
INTRAMUSCULAR | Status: AC
  Filled 2013-06-25: qty 2

## 2013-06-25 MED ORDER — ESCITALOPRAM OXALATE 20 MG PO TABS
20.0000 mg | ORAL_TABLET | Freq: Every day | ORAL | Status: DC
  Administered 2013-06-25: 20 mg via ORAL
  Filled 2013-06-25 (×2): qty 1

## 2013-06-25 MED ORDER — MORPHINE SULFATE 2 MG/ML IJ SOLN
1.0000 mg | INTRAMUSCULAR | Status: DC | PRN
  Administered 2013-06-25: 2 mg via INTRAVENOUS

## 2013-06-25 MED ORDER — POTASSIUM CHLORIDE CRYS ER 20 MEQ PO TBCR
20.0000 meq | EXTENDED_RELEASE_TABLET | Freq: Every day | ORAL | Status: DC
  Administered 2013-06-25: 20 meq via ORAL
  Filled 2013-06-25 (×2): qty 1

## 2013-06-25 SURGICAL SUPPLY — 58 items
BANDAGE GAUZE ELAST BULKY 4 IN (GAUZE/BANDAGES/DRESSINGS) ×4 IMPLANT
BENZOIN TINCTURE PRP APPL 2/3 (GAUZE/BANDAGES/DRESSINGS) ×2 IMPLANT
BIT DRILL SM SPINE QC 12 (BIT) ×2 IMPLANT
BLADE ULTRA TIP 2M (BLADE) ×2 IMPLANT
BUR BARREL STRAIGHT FLUTE 4.0 (BURR) IMPLANT
BUR MATCHSTICK NEURO 3.0 LAGG (BURR) ×2 IMPLANT
CANISTER SUCT 3000ML (MISCELLANEOUS) ×2 IMPLANT
CONT SPEC 4OZ CLIKSEAL STRL BL (MISCELLANEOUS) ×2 IMPLANT
COVER MAYO STAND STRL (DRAPES) ×2 IMPLANT
DRAIN JACKSON PRATT 10MM FLAT (MISCELLANEOUS) ×2 IMPLANT
DRAPE C-ARM 42X72 X-RAY (DRAPES) ×4 IMPLANT
DRAPE LAPAROTOMY 100X72 PEDS (DRAPES) ×2 IMPLANT
DRAPE MICROSCOPE LEICA (MISCELLANEOUS) ×2 IMPLANT
DRAPE POUCH INSTRU U-SHP 10X18 (DRAPES) ×2 IMPLANT
DRSG OPSITE POSTOP 3X4 (GAUZE/BANDAGES/DRESSINGS) ×2 IMPLANT
DURAPREP 6ML APPLICATOR 50/CS (WOUND CARE) ×2 IMPLANT
ELECT REM PT RETURN 9FT ADLT (ELECTROSURGICAL) ×2
ELECTRODE REM PT RTRN 9FT ADLT (ELECTROSURGICAL) ×1 IMPLANT
EVACUATOR SILICONE 100CC (DRAIN) ×2 IMPLANT
GAUZE SPONGE 4X4 16PLY XRAY LF (GAUZE/BANDAGES/DRESSINGS) IMPLANT
GLOVE BIO SURGEON STRL SZ8 (GLOVE) ×2 IMPLANT
GLOVE BIOGEL M 8.0 STRL (GLOVE) ×4 IMPLANT
GLOVE BIOGEL PI IND STRL 8.5 (GLOVE) ×1 IMPLANT
GLOVE BIOGEL PI INDICATOR 8.5 (GLOVE) ×1
GLOVE ECLIPSE 7.5 STRL STRAW (GLOVE) ×6 IMPLANT
GLOVE EXAM NITRILE LRG STRL (GLOVE) IMPLANT
GLOVE EXAM NITRILE MD LF STRL (GLOVE) IMPLANT
GLOVE EXAM NITRILE XL STR (GLOVE) IMPLANT
GLOVE EXAM NITRILE XS STR PU (GLOVE) IMPLANT
GOWN BRE IMP SLV AUR LG STRL (GOWN DISPOSABLE) ×6 IMPLANT
GOWN BRE IMP SLV AUR XL STRL (GOWN DISPOSABLE) ×2 IMPLANT
GOWN STRL REIN 2XL LVL4 (GOWN DISPOSABLE) IMPLANT
HEAD HALTER (SOFTGOODS) ×2 IMPLANT
HEMOSTAT POWDER KIT SURGIFOAM (HEMOSTASIS) IMPLANT
KIT BASIN OR (CUSTOM PROCEDURE TRAY) ×2 IMPLANT
KIT ROOM TURNOVER OR (KITS) ×2 IMPLANT
NEEDLE SPNL 22GX3.5 QUINCKE BK (NEEDLE) ×4 IMPLANT
NS IRRIG 1000ML POUR BTL (IV SOLUTION) ×2 IMPLANT
ONE STEP CPR ×2 IMPLANT
PACK LAMINECTOMY NEURO (CUSTOM PROCEDURE TRAY) ×2 IMPLANT
PATTIES SURGICAL .5 X1 (DISPOSABLE) ×2 IMPLANT
PLATE ANT CERV XTEND 1 LV 12 (Plate) ×2 IMPLANT
PLATE ANT CERV XTEND 1 LV 14 (Plate) ×2 IMPLANT
PUTTY DBX 1CC (Putty) ×2 IMPLANT
PUTTY DBX 1CC DEPUY (Putty) ×1 IMPLANT
RUBBERBAND STERILE (MISCELLANEOUS) ×4 IMPLANT
SCREW XTD VAR 4.2 SELF TAP 12 (Screw) ×16 IMPLANT
SPACER ACDF SM LORDOTIC 7 (Spacer) ×4 IMPLANT
SPONGE GAUZE 4X4 12PLY (GAUZE/BANDAGES/DRESSINGS) ×2 IMPLANT
SPONGE INTESTINAL PEANUT (DISPOSABLE) ×2 IMPLANT
SPONGE SURGIFOAM ABS GEL SZ50 (HEMOSTASIS) ×2 IMPLANT
STRIP CLOSURE SKIN 1/2X4 (GAUZE/BANDAGES/DRESSINGS) ×2 IMPLANT
SUT VIC AB 3-0 SH 8-18 (SUTURE) ×2 IMPLANT
SYR 20ML ECCENTRIC (SYRINGE) ×2 IMPLANT
TAPE CLOTH SURG 4X10 WHT LF (GAUZE/BANDAGES/DRESSINGS) ×2 IMPLANT
TOWEL OR 17X24 6PK STRL BLUE (TOWEL DISPOSABLE) ×2 IMPLANT
TOWEL OR 17X26 10 PK STRL BLUE (TOWEL DISPOSABLE) ×2 IMPLANT
WATER STERILE IRR 1000ML POUR (IV SOLUTION) ×2 IMPLANT

## 2013-06-25 NOTE — Progress Notes (Signed)
Op note [phone]

## 2013-06-25 NOTE — Anesthesia Postprocedure Evaluation (Signed)
Anesthesia Post Note  Patient: [name]  Procedure(s) Performed: Procedure(s) (LRB): ANTERIOR CERVICAL DECOMPRESSION/DISCECTOMY FUSION 2 LEVELS LEVELS C FOUR/FIVE, SIX/SEVEN (N/A)  Anesthesia type: general  Patient location: PACU  Post pain: Pain level controlled  Post assessment: Patient's Cardiovascular Status Stable  Last Vitals:  Filed Vitals:   06/25/13 1615  BP: 123/65  Pulse: 62  Temp:   Resp: 14    Post vital signs: Reviewed and stable  Level of consciousness: sedated  Complications: No apparent anesthesia complications

## 2013-06-25 NOTE — Anesthesia Preprocedure Evaluation (Addendum)
Anesthesia Evaluation  Patient identified by MRN, date of birth, ID band Patient awake    Reviewed: Allergy & Precautions, H&P , NPO status , Patient's Chart, lab work & pertinent test results  Airway Mallampati: II TM Distance: >3 FB Neck ROM: Full    Dental   Pulmonary shortness of breath and with exertion, sleep apnea and Continuous Positive Airway Pressure Ventilation , former smoker,          Cardiovascular hypertension, Pt. on medications and Pt. on home beta blockers + Peripheral Vascular Disease  Pt has kindred congenital prolonged QT syndrome on Inderal. Has had negative stress test in past few years.   Neuro/Psych Seizures -, Well Controlled,  PSYCHIATRIC DISORDERS Anxiety Depression    GI/Hepatic GERD-  Medicated and Controlled,  Endo/Other  Morbid obesity  Renal/GU      Musculoskeletal  (+) Arthritis -, Osteoarthritis,    Abdominal   Peds  Hematology   Anesthesia Other Findings   Reproductive/Obstetrics                          Anesthesia Physical Anesthesia Plan  ASA: III  Anesthesia Plan: General   Post-op Pain Management:    Induction: Intravenous  Airway Management Planned: Oral ETT  Additional Equipment:   Intra-op Plan:   Post-operative Plan: Extubation in OR  Informed Consent: I have reviewed the patients History and Physical, chart, labs and discussed the procedure including the risks, benefits and alternatives for the proposed anesthesia with the patient or authorized representative who has indicated his/her understanding and acceptance.     Plan Discussed with: CRNA and Surgeon  Anesthesia Plan Comments:        Anesthesia Quick Evaluation

## 2013-06-25 NOTE — Preoperative (Signed)
Beta Blockers   Reason not to administer Beta Blockers:Not Applicable 

## 2013-06-25 NOTE — Anesthesia Procedure Notes (Addendum)
Procedure Name: Intubation Date/Time: 06/25/2013 12:22 PM Performed by: Maude Leriche D Pre-anesthesia Checklist: Patient identified, Emergency Drugs available, Suction available, Patient being monitored and Timeout performed Patient Re-evaluated:Patient Re-evaluated prior to inductionOxygen Delivery Method: Circle system utilized Preoxygenation: Pre-oxygenation with 100% oxygen Intubation Type: IV induction Ventilation: Mask ventilation without difficulty and Oral airway inserted - appropriate to patient size Laryngoscope Size: Miller and 2 Grade View: Grade I Tube type: Oral Tube size: 7.5 mm Number of attempts: 1 Airway Equipment and Method: Stylet Placement Confirmation: ETT inserted through vocal cords under direct vision,  positive ETCO2 and breath sounds checked- equal and bilateral Secured at: 22 cm Tube secured with: Tape Dental Injury: Teeth and Oropharynx as per pre-operative assessment

## 2013-06-25 NOTE — Progress Notes (Signed)
Utilization review completed.  

## 2013-06-25 NOTE — Transfer of Care (Signed)
Immediate Anesthesia Transfer of Care Note  Patient: [name]  Procedure(s) Performed: Procedure(s): ANTERIOR CERVICAL DECOMPRESSION/DISCECTOMY FUSION 2 LEVELS LEVELS C FOUR/FIVE, SIX/SEVEN (N/A)  Patient Location: PACU  Anesthesia Type:General  Level of Consciousness: awake, alert  and oriented  Airway & Oxygen Therapy: Patient Spontanous Breathing and Patient connected to nasal cannula oxygen  Post-op Assessment: Report given to PACU RN, Post -op Vital signs reviewed and stable and Patient moving all extremities X 4  Post vital signs: Reviewed and stable  Complications: No apparent anesthesia complications

## 2013-06-26 ENCOUNTER — Encounter (HOSPITAL_COMMUNITY): Payer: Self-pay | Admitting: Neurosurgery

## 2013-06-26 NOTE — Progress Notes (Signed)
Pt. Has her home CPAP set up at the bedside with her personal FFM. CPAP was checked for frayed wires & none were found. Pt. Stated that she would place herself on CPAP before going to bed.

## 2013-06-26 NOTE — Progress Notes (Signed)
Pt doing well. Pt's JP drain was D/C'd with no evidence of hematoma. Pt given D/C instructions with Rx's, verbal understanding was given. Pt received 3n1 for home use from [place] prior to D/C. Pt D/C'd home via wheelchair @ 1335 per MD order. Pt is stable @ D/C and has no other needs. Holli Humbles, RN

## 2013-06-26 NOTE — Evaluation (Signed)
Physical Therapy Evaluation Patient Details Name: [name] MRN: [id number] DOB: 1966-03-30 Today's Date: 06/26/2013 Time: 7253-6644 PT Time Calculation (min): 19 min  PT Assessment / Plan / Recommendation History of Present Illness  48 y.o. female admitted to [facility] on 06/25/13 for elective ACDF C4/5, C6/7.  Pt with significant orthopedic h/o R THA, lumbar surgery.    Clinical Impression  Pt is POD #1 s/p cervical spine surgery.  She is moving very well and is reporting no pain, numbness or tingling in her arms.  All education completed.  Her only obstacle is that her only bathroom is upstairs with no bathroom downstairs.  She is interested in a wide BSC for downstairs use.  RN made aware and is asking MD.  PT to sign off.   PT Assessment  Patent does not need any further PT services    Follow Up Recommendations  No PT follow up    Does the patient have the potential to tolerate intense rehabilitation     NA  Barriers to Discharge   None      Equipment Recommendations  Other (comment) (pt interested in bari [facility], RN to check into it. )    Recommendations for Other Services   None  Frequency   NA- one time eval and d/c   Precautions / Restrictions Precautions Precautions: Cervical Precaution Comments: Handout given by OT reviewed by PT.  Required Braces or Orthoses: Cervical Brace Cervical Brace: Soft collar Restrictions Weight Bearing Restrictions: No   Pertinent Vitals/Pain See vitals flow sheet.       Mobility  Bed Mobility Overal bed mobility:  (not assessed) Transfers Overall transfer level: Independent Ambulation/Gait Ambulation/Gait assistance: Independent Ambulation Distance (Feet): 250 Feet Assistive device: None Gait Pattern/deviations: WFL(Within Functional Limits) Gait velocity: WNL Gait velocity interpretation: at or above normal speed for age/gender General Gait Details: Pt is a very fast walker, but steady on her feet.  Stairs: Yes Stairs  assistance: Modified independent (Device/Increase time) Stair Management: One rail Left;Alternating pattern;Forwards Number of Stairs: 9 General stair comments: Verbal cues for light upper extremity support on the railing to decrease the strain on her neck.  Also verbal cues to take one step at a time, pt went up reciprocally anyway with no issues.         PT Goals(Current goals can be found in the care plan section) Acute Rehab PT Goals Patient Stated Goal: home today PT Goal Formulation: No goals set, d/c therapy  Visit Information  Last PT Received On: 06/26/13 Assistance Needed: +1 History of Present Illness: 48 y.o. female admitted to [facility] on 06/25/13 for elective ACDF C4/5, C6/7.  Pt with significant orthopedic h/o R THA, lumbar surgery.         Prior [place] expects to be discharged to:: Private residence Living Arrangements: Spouse/significant other;Children (adult son) Available Help at Discharge: Friend(s);Available 24 hours/day;Personal care attendant (aide 3 hours a day) Type of Home: Apartment Home Access: Level entry Home Layout: Two level;Bed/bath upstairs Alternate Level Stairs-Number of Steps: 1 flight Home Equipment: Cane - single point;Other (comment) (R AFO) Additional Comments: Per pt her only bathroom is upstairs.  She is interested in a [facility] for use downstairs.  Prior Function Level of Independence: Needs assistance Gait / Transfers Assistance Needed: Has an ankle brace for use on her right foot with a custom orthotic.   ADL's / Homemaking Assistance Needed: aide assisted with homemaking Communication / Swallowing Assistance Needed: NA Communication Communication: No difficulties  Dominant Hand: Right    Cognition  Cognition Arousal/Alertness: Awake/alert Behavior During Therapy: WFL for tasks assessed/performed Overall Cognitive Status: Within Functional Limits for tasks assessed    Extremity/Trunk Assessment Upper  Extremity Assessment Upper Extremity Assessment: Defer to OT evaluation Lower Extremity Assessment Lower Extremity Assessment: Overall WFL for tasks assessed Cervical / Trunk Assessment Cervical / Trunk Assessment: Normal   Balance Balance Overall balance assessment: No apparent balance deficits (not formally assessed)  End of Session PT - End of Session Equipment Utilized During Treatment: Cervical collar Activity Tolerance: Patient tolerated treatment well Patient left: Other (comment) (in room with significant other) Nurse Communication: Mobility status;Other (comment) (pt interested in bari [facility] for use downstairs)    Bernadene Garside B. [place], [place], DPT [phone]   06/26/2013, 10:31 AM

## 2013-06-26 NOTE — Evaluation (Signed)
Occupational Therapy Evaluation Patient Details Name: [name] MRN: [id number] DOB: [birth year] Today's Date: 06/26/2013 Time: 2952-8413 OT Time Calculation (min): 20 min  OT Assessment / Plan / Recommendation History of present illness s/p ACDF   Clinical Impression   Pt educated in cervical precautions and safety.  Demonstrated ability to perform self care in standing at sink and ADL transfers independently.  Pt reports sensation changes she had prior to surgery have resolved.    OT Assessment  Patient does not need any further OT services    Follow Up Recommendations  No OT follow up;Supervision - Intermittent    Barriers to Discharge      Equipment Recommendations  None recommended by OT    Recommendations for Other Services    Frequency       Precautions / Restrictions Precautions Precautions: Cervical Required Braces or Orthoses: Cervical Brace Cervical Brace: Soft collar Restrictions Weight Bearing Restrictions: No   Pertinent Vitals/Pain No c/o pain, VSS    ADL  Eating/Feeding: Independent Where Assessed - Eating/Feeding: Edge of bed Grooming: Wash/dry hands;Wash/dry face;Brushing hair;Independent Where Assessed - Grooming: Unsupported standing Upper Body Bathing: Independent Where Assessed - Upper Body Bathing: Unsupported standing Lower Body Bathing: Independent Where Assessed - Lower Body Bathing: Unsupported standing Upper Body Dressing: Independent Where Assessed - Upper Body Dressing: Unsupported standing Lower Body Dressing: Modified independent Where Assessed - Lower Body Dressing: Unsupported sitting;Unsupported standing Toilet Transfer: Programmer, applications Method: Sit to stand Toileting - Water quality scientist and Hygiene: Independent Where Assessed - Toileting Clothing Manipulation and Hygiene: Sit to stand from 3-in-1 or toilet Equipment Used:  (soft collar) Transfers/Ambulation Related to ADLs: independent no device ADL  Comments: Pt instructed in cervical precautions.    OT Diagnosis:    OT Problem List:   OT Treatment Interventions:     OT Goals(Current goals can be found in the care plan section) Acute Rehab OT Goals Patient Stated Goal: home today  Visit Information  Last OT Received On: 06/26/13 History of Present Illness: s/p ACDF       Prior Functioning     Home Living Family/patient expects to be discharged to:: Private residence Living Arrangements: Spouse/significant other;Children (adult son) Available Help at Discharge: Friend(s);Available 24 hours/day;Personal care attendant (aide 3 hours a day) Type of Home: Apartment Home Access: Level entry Home Layout: Two level;Bed/bath upstairs Alternate Level Stairs-Number of Steps: 1 flight Home Equipment: Cane - single point;Other (comment) (R AFO) Prior Function Level of Independence: Needs assistance ADL's / Homemaking Assistance Needed: aide assisted with homemaking Communication Communication: No difficulties Dominant Hand: Right         Vision/Perception     Cognition  Cognition Arousal/Alertness: Awake/alert Behavior During Therapy: WFL for tasks assessed/performed Overall Cognitive Status: Within Functional Limits for tasks assessed    Extremity/Trunk Assessment Upper Extremity Assessment Upper Extremity Assessment: Overall WFL for tasks assessed Lower Extremity Assessment Lower Extremity Assessment: Defer to PT evaluation Cervical / Trunk Assessment Cervical / Trunk Assessment: Normal     Mobility Bed Mobility Overal bed mobility:  (not assessed) Transfers Overall transfer level: Independent     Exercise     Balance     End of Session OT - End of Session Activity Tolerance: Patient tolerated treatment well Patient left: in bed;with family/visitor present;with call bell/phone within reach (EOB) Nurse Communication: Mobility status  GO     Malka So 06/26/2013, 9:42 AM [phone]

## 2013-06-26 NOTE — Discharge Summary (Signed)
Physician Discharge Summary  Patient ID: [name] MRN: [id number] DOB/AGE: [birth year] 48 y.o.  Admit date: 06/25/2013 Discharge date: 06/26/2013  Admission Diagnoses:c45, 67  Discharge Diagnoses:  Active Problems:   Cervical stenosis of spinal canal   Discharged Condition:no pain  Hospital Course: surgery  Consults none  Significant Diagnostic Studies: mri}  Treatments:surgery  Discharge Exam: Blood pressure 112/65, pulse 71, temperature 97.5 F (36.4 C), temperature source Oral, resp. rate 18, last menstrual period 06/20/2013, SpO2 96.00%.no pain  Disposition: wants to go home     Medication List    ASK your doctor about these medications       AMBIEN 10 MG tablet  Generic drug:  zolpidem  Take 10 mg by mouth at bedtime as needed.     aspirin 81 MG tablet  Take 81 mg by mouth daily.     escitalopram 10 MG tablet  Commonly known as:  LEXAPRO  Take 20 mg by mouth daily.     esomeprazole 40 MG capsule  Commonly known as:  NEXIUM  Take 40 mg by mouth daily at 12 noon.     Fish Oil 1000 MG Caps  Take 1 capsule by mouth 3 (three) times daily.     GLUCOSAMINE 1500 COMPLEX Caps  Take 1 each by mouth daily.     multivitamin with minerals tablet  Take 1 tablet by mouth daily.     nabumetone 500 MG tablet  Commonly known as:  RELAFEN  Take 750 mg by mouth 2 (two) times daily.     omeprazole 20 MG capsule  Commonly known as:  PRILOSEC  Take 20 mg by mouth daily.     potassium chloride SA 20 MEQ tablet  Commonly known as:  K-DUR,KLOR-CON  Take 1 tablet (20 mEq total) by mouth daily.     propranolol ER 60 MG 24 hr capsule  Commonly known as:  INDERAL LA  Take 1 capsule (60 mg total) by mouth daily.     SLOW RELEASE IRON PO  Take by mouth.     triamterene-hydrochlorothiazide 75-50 MG per tablet  Commonly known as:  MAXZIDE  Take 1/2 tablet by mouth once daily         Signed: Raynell Upton M 06/26/2013, 11:04 AM

## 2013-06-26 NOTE — Care Management Note (Signed)
CARE MANAGEMENT NOTE 06/26/2013  Patient:  [name], [name]   Account Number:  [id number]  Date Initiated:  06/26/2013  Documentation initiated by:  Ricki Miller  Subjective/Objective Assessment:   48 yr old female s/p ACF C4-5,C6-7     Action/Plan:   Patient needs bariatric drop arm 3in1 . Referral called to [facility] DME liasion.   Anticipated DC Date:  06/26/2013   Anticipated DC Plan:  [place]  CM consult      PAC Choice  DURABLE MEDICAL EQUIPMENT   Choice offered to / List presented to:     DME arranged  3-N-1      DME agency  [place] arranged  NA      Status of service:  Completed, signed off Medicare Important Message given?   (If response is "NO", the following Medicare IM given date fields will be blank) Date Medicare IM given:   Date Additional Medicare IM given:    Discharge Disposition:  HOME/SELF CARE  Per UR Regulation:    If discussed at Long Length of Stay Meetings, dates discussed:    Comments:

## 2013-06-26 NOTE — Op Note (Signed)
[name], [name]            ACCOUNT NO.:  [id number]  MEDICAL RECORD NO.:  [id number]  LOCATION:  3C09C                        FACILITY:  [facility]  PHYSICIAN:  Leeroy Cha, M.D.   DATE OF BIRTH:  01/19/1966  DATE OF PROCEDURE:  06/25/2013 DATE OF DISCHARGE:                              OPERATIVE REPORT   PREOPERATIVE DIAGNOSIS:  Cervical stenosis at the level C4-5, C5-6 with a herniated disk.  Cervical stenosis C6-7 with stenosis.  Degenerative disk disease.  POSTOPERATIVE DIAGNOSIS:  Cervical stenosis at the level C4-5, C5-6 with a herniated disk.  Cervical stenosis C6-7 with stenosis.  Degenerative disk disease. Morbid obesity.  PROCEDURE:  Anterior C4-5 and C6-7 diskectomy, decompression of spinal cord, removal of a herniated disk at the level of C4-5 left, interbody fusion with cages plate from L8-7 and from C6-7, microscope.  SURGEON:  Leeroy Cha, M.D.  ASSISTANT:  Dr. Vertell Limber.  CLINICAL HISTORY:  [name] is a lady who in the past underwent lumbar fusion.  Lately, she had been complaining of neck pain with radiation to the left upper extremity.  No better with conservative treatment.  X-rays showed stenosis with a herniated disk at L4-5 to the left and also stenosis at the level C6-7.  She has some finding also between C3-4 and C5-6, but not significant as the one above.  Also the lady has a BMI of 48.  The patient knew the risks and the benefit with surgery.  PROCEDURE IN DETAIL:  The patient was taken to the OR and we had to positioning in a way to be able to see the neck.  We put a roll between the shoulder and the right arm was extended to 90 degrees, and the left arm was close to the body.  That was the only way, we were able to see the anterior part of the neck.  Then, the area was cleaned with DuraPrep and drapes were applied.  Longitudinal incision was made through the skin and subcutaneous tissue straight down to the cervical area.  Here it took  [place] at least 45 minutes to be able to localize the area.  We did a x-ray, did not help.  We did AP view and was confusion and at the end we were able to squeeze the C-arm above the head and we did lateral x- rays which showed at least the upper needle was at the level of 3-4. From then on, we were able to localize 4-5, 5-6, 6-7.  Then, we proceeded with opening the anterior ligament at the level of L4-5 with the help of the microscope, we did diskectomy.  Upon opening the posterior ligament, we found that she has a herniated disk with fragment going to the left side.  Decompression of the spinal cord as well as the C5 nerve root was achieved.  At the level of C6-7, we found quite a bit of stenosis worse bilaterally with decompression at the end of the cord as well as the foramen.  We investigated the foramen bilaterally and after we removed the disk and the spondylosis there was no other compromise and the area was opened not only for the cord at the level 4- 5 and  6-7, but also the C5 and C7 nerve root.  From then on, the endplate were drilled and 2 cages of 7 mm height lordotic with DBX and autograft inside were inserted.  This was followed by a single placed one from C4-C5 and the other one from C6-7.  The area was irrigated.  Although, we achieved good hemostasis, we decided to leave a drain because of the size of the patient.  We had difficulty with the x-ray prior to doing surgery, we decided not to do a postop x-ray.  The wound was closed finally with Vicryl and Steri-Strips.          ______________________________ Leeroy Cha, M.D.     EB/MEDQ  D:  06/25/2013  T:  06/26/2013  Job:  [id number]

## 2013-07-15 DIAGNOSIS — M76899 Other specified enthesopathies of unspecified lower limb, excluding foot: Secondary | ICD-10-CM | POA: Diagnosis not present

## 2013-07-15 DIAGNOSIS — M25559 Pain in unspecified hip: Secondary | ICD-10-CM | POA: Diagnosis not present

## 2013-07-15 DIAGNOSIS — Z96649 Presence of unspecified artificial hip joint: Secondary | ICD-10-CM | POA: Diagnosis not present

## 2013-07-22 DIAGNOSIS — M542 Cervicalgia: Secondary | ICD-10-CM | POA: Diagnosis not present

## 2013-07-22 DIAGNOSIS — M5412 Radiculopathy, cervical region: Secondary | ICD-10-CM | POA: Diagnosis not present

## 2013-07-26 ENCOUNTER — Ambulatory Visit (INDEPENDENT_AMBULATORY_CARE_PROVIDER_SITE_OTHER): Payer: Medicare Other

## 2013-07-26 VITALS — BP 118/77 | HR 68 | Resp 16 | Ht 64.0 in | Wt 289.0 lb

## 2013-07-26 DIAGNOSIS — M79609 Pain in unspecified limb: Secondary | ICD-10-CM

## 2013-07-26 DIAGNOSIS — B351 Tinea unguium: Secondary | ICD-10-CM | POA: Diagnosis not present

## 2013-07-26 DIAGNOSIS — L600 Ingrowing nail: Secondary | ICD-10-CM | POA: Diagnosis not present

## 2013-07-26 MED ORDER — CEPHALEXIN 500 MG PO CAPS: 500.0000 mg | ORAL_CAPSULE | Freq: Three times a day (TID) | ORAL | Status: DC

## 2013-07-26 NOTE — Progress Notes (Signed)
   Subjective:    Patient ID: [name], female    DOB: 03-22-1966, 47 y.o.   MRN: [id number]  HPI Comments: "My toenails have grown back"  Patient states that she had all 8 toenails removed permanently years ago and over time they have grown back. They are thickened and discolored. Occasionally they get sore. Wants to know what she can do about them.     Review of Systems  Constitutional: Positive for unexpected weight change.  HENT: Positive for trouble swallowing.   Cardiovascular: Positive for leg swelling.  Musculoskeletal: Positive for arthralgias.  Skin:       Thick scars        Objective:   Physical Exam Vascular status appears to be intact with pedal pulses palpable DP pulse 2 for PT one over 4 Refill time 3 seconds all digits skin temperature warm turgor normal no scissors mild edema no rubor pallor or varicosities noted neurologically epicritic and proprioceptive sensations intact although diminished on Semmes Weinstein testing to the digits and plantar arch. Patient is a previous nail excisions 1 through 4 bilateral bothersome several small spicules over the lateral proximal nail fold of the right great toe has a relatively large painful spicule exquisite painful tender and attempted debridement and palpation the fifth digit nails also shows some thickening discoloration ecchymosis consistent with onychomycosis several nails also shows some eschar tissue noninjury regrowth of nail release and eschar tissues are debrided at this time suggested lotion to the nail beds every day in the future. Orthopedic biomechanical exam unremarkable patient recently had cervical surgery with screw fixation of her cervical spine patient still is neck brace on otherwise no other significant changes masker status adequate intact no other open wounds or ulcerations noted       Assessment & Plan:  Assessment this time recurrence or residual nail spicule or ingrowing nail right hallux  lateral border recognition for redo nail excision with phenol matricectomy local anesthetic block administered total of 3 cc 50-50 mixture of 2% Xylocaine plain and 0.5% Marcaine plain to the right great toe beverage performed the spicule of remaining nail tissue were excised fetal meniscectomy followed by alcohol wash a gentleman presto dressing applied. Patient initiated daily cleansing with antibacterial soap and water Neosporin and Band-Aid dressings recommended Tylenol for pain precaution patient was placed on cephalexin antibiotic any for infection process and were for recent spine surgery as well. Reappointed 2 weeks for followup for nail check  Harriet Masson DPM

## 2013-07-26 NOTE — Patient Instructions (Signed)
ANTIBACTERIAL SOAP INSTRUCTIONS  THE DAY AFTER PROCEDURE  Please follow the instructions your doctor has marked.   Shower as usual. Before getting out, place a drop of antibacterial liquid soap (Dial) on a wet, clean washcloth.  Gently wipe washcloth over affected area.  Afterward, rinse the area with warm water.  Blot the area dry with a soft cloth and cover with antibiotic ointment (neosporin, polysporin, bacitracin) and band aid or gauze and tape  Place 3-4 drops of antibacterial liquid soap in a quart of warm tap water.  Submerge foot into water for 20 minutes.  If bandage was applied after your procedure, leave on to allow for easy lift off, then remove and continue with soak for the remaining time.  Next, blot area dry with a soft cloth and cover with a bandage.  Apply other medications as directed by your doctor, such as cortisporin otic solution (eardrops) or neosporin antibiotic ointment  Recommended Tylenol as needed for pain 1 or 2 Tylenol every 8 hours as needed for pain

## 2013-07-30 ENCOUNTER — Other Ambulatory Visit: Payer: Self-pay | Admitting: *Deleted

## 2013-07-30 DIAGNOSIS — I4581 Long QT syndrome: Secondary | ICD-10-CM

## 2013-07-30 MED ORDER — PROPRANOLOL HCL ER 60 MG PO CP24: 60.0000 mg | ORAL_CAPSULE | Freq: Every day | ORAL | Status: DC

## 2013-07-30 NOTE — Telephone Encounter (Signed)
Requested Prescriptions   Signed Prescriptions Disp Refills  . propranolol ER (INDERAL LA) 60 MG 24 hr capsule 90 capsule 3    Sig: Take 1 capsule (60 mg total) by mouth daily.    Authorizing Provider: Deboraha Sprang    Ordering User: Britt Bottom

## 2013-07-31 ENCOUNTER — Ambulatory Visit (INDEPENDENT_AMBULATORY_CARE_PROVIDER_SITE_OTHER): Payer: Medicare Other | Admitting: Internal Medicine

## 2013-07-31 VITALS — BP 157/112 | HR 76 | Ht 64.0 in | Wt 294.8 lb

## 2013-07-31 DIAGNOSIS — R42 Dizziness and giddiness: Secondary | ICD-10-CM | POA: Diagnosis not present

## 2013-07-31 DIAGNOSIS — I4581 Long QT syndrome: Secondary | ICD-10-CM

## 2013-07-31 LAB — BASIC METABOLIC PANEL
BUN: 14 mg/dL (ref 6–23)
CALCIUM: 9 mg/dL (ref 8.4–10.5)
CHLORIDE: 101 meq/L (ref 96–112)
CO2: 29 mEq/L (ref 19–32)
Creatinine, Ser: 0.9 mg/dL (ref 0.4–1.2)
GFR: 83.93 mL/min (ref >60.00)
GLUCOSE: 100 mg/dL — AB (ref 70–99)
Potassium: 3.5 mEq/L (ref 3.5–5.1)
Sodium: 137 mEq/L (ref 135–145)

## 2013-07-31 MED ORDER — LABETALOL HCL 200 MG PO TABS: 200.0000 mg | ORAL_TABLET | Freq: Two times a day (BID) | ORAL | Status: DC

## 2013-07-31 NOTE — Progress Notes (Signed)
Patient Care Team: Halina Maidens, MD as PCP - General (Family Medicine) Halina Maidens, MD (Internal Medicine)   HPI  [name] is a 48 y.o. 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 She takes Inderal. She has had a history of atypical chest pain. She underwent Myoview scanning 2011 which demonstrated no ischemia and normal left ventricular function  The patient denies chest pain, but does have complaints of orthostatic dizziness. This is associated with blood pressure going up.  She is intercurrently undergone neck surgery.   Past Medical History  Diagnosis Date  . Prolonged QT interval     gene positive  . HTN (hypertension)   . Atypical chest pain     negative Myoview in 2011 with no ischemi and normal LV function.   . Obesity   . OSA (obstructive sleep apnea)     uses C-PAP  . Anxiety   . Depression   . Shortness of breath     with exertion  . Peripheral vascular disease     blood clot in right leg  . GERD (gastroesophageal reflux disease)     uses Nexium  . Seizures     hasn't had any for 4 years  . Arthritis     osteoarthritis  . Anemia   . Cancer     polyps were malignant per pt.    Past Surgical History  Procedure Laterality Date  . Total hip arthroplasty    . Lumbar disc surgery    . Left foot surgery    . Left hand surgery x 2    . Right hand surgery for torn ligament    . Colonoscopy    . Cardiac catheterization    . Dilation and curettage of uterus    . Anterior cervical decomp/discectomy fusion N/A 06/25/2013    Procedure: ANTERIOR CERVICAL DECOMPRESSION/DISCECTOMY FUSION 2 LEVELS LEVELS C FOUR/FIVE, SIX/SEVEN;  Surgeon: Floyce Stakes, MD;  Location: [facility];  Service: Neurosurgery;  Laterality: N/A;    Current Outpatient Prescriptions  Medication Sig Dispense Refill  . aspirin 81 MG tablet Take 81 mg by mouth daily.        Marland Kitchen escitalopram (LEXAPRO) 10 MG tablet Take 20 mg  by mouth daily.       Marland Kitchen esomeprazole (NEXIUM) 40 MG capsule Take 40 mg by mouth daily at 12 noon.      . Ferrous Sulfate (SLOW RELEASE IRON PO) Take by mouth.      . Glucosamine-Chondroit-Vit C-Mn (GLUCOSAMINE 1500 COMPLEX) CAPS Take 1 each by mouth daily.      . Multiple Vitamins-Minerals (MULTIVITAMIN WITH MINERALS) tablet Take 1 tablet by mouth daily.      . nabumetone (RELAFEN) 500 MG tablet Take 750 mg by mouth 2 (two) times daily.       . Omega-3 Fatty Acids (FISH OIL) 1000 MG CAPS Take 1 capsule by mouth 3 (three) times daily.        . potassium chloride SA (K-DUR,KLOR-CON) 20 MEQ tablet Take 1 tablet (20 mEq total) by mouth daily.  30 tablet  3  . propranolol ER (INDERAL LA) 60 MG 24 hr capsule Take 1 capsule (60 mg total) by mouth daily.  90 capsule  3  . triamterene-hydrochlorothiazide (MAXZIDE) 75-50 MG per tablet Take 1/2 tablet by mouth once daily      . zolpidem (AMBIEN) 10 MG tablet Take 10 mg by mouth at bedtime as needed.        Marland Kitchen  cephALEXin (KEFLEX) 500 MG capsule Take 1 capsule (500 mg total) by mouth 3 (three) times daily.  21 capsule  0  . omeprazole (PRILOSEC) 20 MG capsule Take 20 mg by mouth daily.        No current facility-administered medications for this visit.    No Known Allergies  Review of Systems negative except from HPI and PMH  Physical Exam BP 157/112  Pulse 76  Ht 5\' 4"  (1.626 m)  Wt 294 lb 12.8 oz (133.72 kg)  BMI 50.58 kg/m2 Well developed and well nourished in no acute distress HENT normal E scleral and icterus clear Neck Supple JVP flat; carotids brisk and full Clear to ausculation  Regular rate and rhythm, no murmurs gallops or rub Soft with active bowel sounds No clubbing cyanosis none Edema Alert and oriented, grossly normal motor and sensory function Skin Warm and Dry  ECG February 2015  demonstrates sinus rhythm at 59 Intervals 04/11/47 with a QTC of 475  Assessment and  Plan  Long QT syndrome  Hypertension  Orthostatic  hypertension  Patient with long QT syndrome potassium in February was 3.8. It has been on the lower side. We will recheck it today.  With her orthostatic hypertension, this is thought to be a consequence of sympathetic overactivity, alpha agonistic therapy is recommended. We will switch her propranolol and labetalol 200 mg twice daily.  This is frequently seen in diabetics of which there is no evidence of this autonomic syndrome as well as with hypertension  Consideration should be given for sleep study.

## 2013-07-31 NOTE — Patient Instructions (Signed)
Your physician has recommended you make the following change in your medication:  1) Stop Propranolol 2) Start Latetalol 200 mg twice daily  Your physician recommends that you return for lab work today: BMET  Your physician wants you to follow-up in: 1 year with Dr. Caryl Comes.  You will receive a reminder letter in the mail two months in advance. If you don't receive a letter, please call our office to schedule the follow-up appointment.

## 2013-08-07 DIAGNOSIS — N920 Excessive and frequent menstruation with regular cycle: Secondary | ICD-10-CM | POA: Diagnosis not present

## 2013-08-07 DIAGNOSIS — H5340 Unspecified visual field defects: Secondary | ICD-10-CM | POA: Diagnosis not present

## 2013-08-07 DIAGNOSIS — N946 Dysmenorrhea, unspecified: Secondary | ICD-10-CM | POA: Diagnosis not present

## 2013-08-12 ENCOUNTER — Telehealth: Payer: Self-pay | Admitting: Internal Medicine

## 2013-08-12 NOTE — Telephone Encounter (Signed)
New message     Returning [name]'s call from last week

## 2013-08-12 NOTE — Telephone Encounter (Signed)
Follow up     Pt said Sharyn Lull called her last week, she is returning her call

## 2013-08-13 DIAGNOSIS — K589 Irritable bowel syndrome without diarrhea: Secondary | ICD-10-CM | POA: Diagnosis not present

## 2013-08-13 DIAGNOSIS — D509 Iron deficiency anemia, unspecified: Secondary | ICD-10-CM | POA: Diagnosis not present

## 2013-08-13 DIAGNOSIS — K5900 Constipation, unspecified: Secondary | ICD-10-CM | POA: Diagnosis not present

## 2013-08-14 ENCOUNTER — Telehealth: Payer: Self-pay | Admitting: Internal Medicine

## 2013-08-14 ENCOUNTER — Other Ambulatory Visit: Payer: Self-pay | Admitting: *Deleted

## 2013-08-14 DIAGNOSIS — R42 Dizziness and giddiness: Secondary | ICD-10-CM

## 2013-08-14 NOTE — Telephone Encounter (Signed)
New message ° ° ° ° ° °Returning a nurses call °

## 2013-08-14 NOTE — Telephone Encounter (Signed)
A user error has taken place: encounter opened in error, closed for administrative reasons.  See lab documentation for this.

## 2013-08-14 NOTE — Telephone Encounter (Signed)
A user error has taken place: encounter opened in error, closed for administrative reasons.   See lab documentation

## 2013-08-16 ENCOUNTER — Ambulatory Visit: Payer: Medicare Other

## 2013-08-16 DIAGNOSIS — D509 Iron deficiency anemia, unspecified: Secondary | ICD-10-CM | POA: Diagnosis not present

## 2013-08-16 DIAGNOSIS — K589 Irritable bowel syndrome without diarrhea: Secondary | ICD-10-CM | POA: Diagnosis not present

## 2013-08-16 DIAGNOSIS — K5900 Constipation, unspecified: Secondary | ICD-10-CM | POA: Diagnosis not present

## 2013-08-19 DIAGNOSIS — I32 Pericarditis in diseases classified elsewhere: Secondary | ICD-10-CM | POA: Diagnosis not present

## 2013-08-19 DIAGNOSIS — D649 Anemia, unspecified: Secondary | ICD-10-CM | POA: Diagnosis not present

## 2013-08-19 DIAGNOSIS — F329 Major depressive disorder, single episode, unspecified: Secondary | ICD-10-CM | POA: Diagnosis not present

## 2013-08-19 DIAGNOSIS — I10 Essential (primary) hypertension: Secondary | ICD-10-CM | POA: Diagnosis not present

## 2013-08-19 DIAGNOSIS — M542 Cervicalgia: Secondary | ICD-10-CM | POA: Diagnosis not present

## 2013-08-19 DIAGNOSIS — K219 Gastro-esophageal reflux disease without esophagitis: Secondary | ICD-10-CM | POA: Diagnosis not present

## 2013-08-19 DIAGNOSIS — M5412 Radiculopathy, cervical region: Secondary | ICD-10-CM | POA: Diagnosis not present

## 2013-08-19 DIAGNOSIS — F3289 Other specified depressive episodes: Secondary | ICD-10-CM | POA: Diagnosis not present

## 2013-08-19 DIAGNOSIS — Z0000 Encounter for general adult medical examination without abnormal findings: Secondary | ICD-10-CM | POA: Diagnosis not present

## 2013-08-19 DIAGNOSIS — Z6841 Body Mass Index (BMI) 40.0 and over, adult: Secondary | ICD-10-CM | POA: Diagnosis not present

## 2013-08-19 LAB — LIPID PANEL
CHOLESTEROL: 189 mg/dL (ref 0–200)
HDL: 40 mg/dL (ref 35–70)
LDL Cholesterol: 118 mg/dL
Triglycerides: 157 mg/dL (ref 40–160)

## 2013-08-23 ENCOUNTER — Other Ambulatory Visit (INDEPENDENT_AMBULATORY_CARE_PROVIDER_SITE_OTHER): Payer: Medicare Other

## 2013-08-23 DIAGNOSIS — R42 Dizziness and giddiness: Secondary | ICD-10-CM | POA: Diagnosis not present

## 2013-08-23 LAB — BASIC METABOLIC PANEL
BUN: 15 mg/dL (ref 6–23)
CHLORIDE: 105 meq/L (ref 96–112)
CO2: 26 mEq/L (ref 19–32)
CREATININE: 0.9 mg/dL (ref 0.4–1.2)
Calcium: 9.7 mg/dL (ref 8.4–10.5)
GFR: 88.33 mL/min (ref >60.00)
GLUCOSE: 108 mg/dL — AB (ref 70–99)
Potassium: 3.1 mEq/L — ABNORMAL LOW (ref 3.5–5.1)
Sodium: 137 mEq/L (ref 135–145)

## 2013-09-03 ENCOUNTER — Ambulatory Visit: Payer: Self-pay | Admitting: Internal Medicine

## 2013-09-03 DIAGNOSIS — R922 Inconclusive mammogram: Secondary | ICD-10-CM | POA: Diagnosis not present

## 2013-09-03 DIAGNOSIS — N63 Unspecified lump in unspecified breast: Secondary | ICD-10-CM | POA: Diagnosis not present

## 2013-09-03 LAB — HM MAMMOGRAPHY: HM MAMMO: NORMAL

## 2013-09-09 DIAGNOSIS — M25559 Pain in unspecified hip: Secondary | ICD-10-CM | POA: Diagnosis not present

## 2013-09-12 DIAGNOSIS — M25559 Pain in unspecified hip: Secondary | ICD-10-CM | POA: Diagnosis not present

## 2013-09-12 DIAGNOSIS — M1610 Unilateral primary osteoarthritis, unspecified hip: Secondary | ICD-10-CM | POA: Diagnosis not present

## 2013-09-12 DIAGNOSIS — M169 Osteoarthritis of hip, unspecified: Secondary | ICD-10-CM | POA: Diagnosis not present

## 2013-09-12 DIAGNOSIS — M76899 Other specified enthesopathies of unspecified lower limb, excluding foot: Secondary | ICD-10-CM | POA: Diagnosis not present

## 2013-09-12 DIAGNOSIS — Z981 Arthrodesis status: Secondary | ICD-10-CM | POA: Diagnosis not present

## 2013-09-12 DIAGNOSIS — Z96649 Presence of unspecified artificial hip joint: Secondary | ICD-10-CM | POA: Diagnosis not present

## 2013-09-17 ENCOUNTER — Encounter: Payer: Self-pay | Admitting: Internal Medicine

## 2013-09-17 NOTE — Telephone Encounter (Signed)
New problem ° ° °Pt returning your call from last week °

## 2013-09-17 NOTE — Telephone Encounter (Signed)
This encounter was created in error - please disregard.

## 2013-09-17 NOTE — Telephone Encounter (Signed)
Follow up     Patient stating the nurse call her returning call back

## 2013-09-18 ENCOUNTER — Encounter: Payer: Self-pay | Admitting: *Deleted

## 2013-09-18 NOTE — Telephone Encounter (Signed)
Patient returned your call about a med change. Please advise. Thanks, MI

## 2013-09-19 ENCOUNTER — Other Ambulatory Visit: Payer: Self-pay | Admitting: *Deleted

## 2013-09-19 DIAGNOSIS — E876 Hypokalemia: Secondary | ICD-10-CM

## 2013-09-19 MED ORDER — SPIRONOLACTONE 25 MG PO TABS: 25.0000 mg | ORAL_TABLET | Freq: Every day | ORAL | Status: DC

## 2013-09-19 NOTE — Telephone Encounter (Signed)
This encounter was created in error - please disregard.

## 2013-09-20 DIAGNOSIS — M1610 Unilateral primary osteoarthritis, unspecified hip: Secondary | ICD-10-CM | POA: Diagnosis not present

## 2013-09-20 DIAGNOSIS — M169 Osteoarthritis of hip, unspecified: Secondary | ICD-10-CM | POA: Diagnosis not present

## 2013-09-25 DIAGNOSIS — M169 Osteoarthritis of hip, unspecified: Secondary | ICD-10-CM | POA: Diagnosis not present

## 2013-09-25 DIAGNOSIS — M1610 Unilateral primary osteoarthritis, unspecified hip: Secondary | ICD-10-CM | POA: Diagnosis not present

## 2013-09-27 ENCOUNTER — Other Ambulatory Visit (INDEPENDENT_AMBULATORY_CARE_PROVIDER_SITE_OTHER): Payer: Medicare Other

## 2013-09-27 DIAGNOSIS — E876 Hypokalemia: Secondary | ICD-10-CM

## 2013-09-27 LAB — BASIC METABOLIC PANEL
BUN: 12 mg/dL (ref 6–23)
CALCIUM: 9.3 mg/dL (ref 8.4–10.5)
CO2: 26 meq/L (ref 19–32)
Chloride: 107 mEq/L (ref 96–112)
Creatinine, Ser: 1 mg/dL (ref 0.4–1.2)
GFR: 77.98 mL/min (ref >60.00)
GLUCOSE: 91 mg/dL (ref 70–99)
Potassium: 3.8 mEq/L (ref 3.5–5.1)
SODIUM: 140 meq/L (ref 135–145)

## 2013-10-01 DIAGNOSIS — D259 Leiomyoma of uterus, unspecified: Secondary | ICD-10-CM | POA: Diagnosis not present

## 2013-10-01 DIAGNOSIS — N946 Dysmenorrhea, unspecified: Secondary | ICD-10-CM | POA: Diagnosis not present

## 2013-10-01 DIAGNOSIS — N920 Excessive and frequent menstruation with regular cycle: Secondary | ICD-10-CM | POA: Diagnosis not present

## 2013-10-04 ENCOUNTER — Other Ambulatory Visit: Payer: Self-pay | Admitting: *Deleted

## 2013-10-04 MED ORDER — POTASSIUM CHLORIDE CRYS ER 20 MEQ PO TBCR: 20.0000 meq | EXTENDED_RELEASE_TABLET | Freq: Every day | ORAL | Status: DC

## 2013-10-08 ENCOUNTER — Ambulatory Visit: Payer: Self-pay | Admitting: Family Medicine

## 2013-10-08 DIAGNOSIS — Z96649 Presence of unspecified artificial hip joint: Secondary | ICD-10-CM | POA: Diagnosis not present

## 2013-10-08 DIAGNOSIS — Q234 Hypoplastic left heart syndrome: Secondary | ICD-10-CM | POA: Diagnosis not present

## 2013-10-08 DIAGNOSIS — M1990 Unspecified osteoarthritis, unspecified site: Secondary | ICD-10-CM | POA: Diagnosis not present

## 2013-10-08 DIAGNOSIS — I498 Other specified cardiac arrhythmias: Secondary | ICD-10-CM | POA: Diagnosis not present

## 2013-10-08 DIAGNOSIS — I8000 Phlebitis and thrombophlebitis of superficial vessels of unspecified lower extremity: Secondary | ICD-10-CM | POA: Diagnosis not present

## 2013-10-08 DIAGNOSIS — I82819 Embolism and thrombosis of superficial veins of unspecified lower extremities: Secondary | ICD-10-CM | POA: Diagnosis not present

## 2013-10-08 DIAGNOSIS — I824Y9 Acute embolism and thrombosis of unspecified deep veins of unspecified proximal lower extremity: Secondary | ICD-10-CM | POA: Diagnosis not present

## 2013-10-08 DIAGNOSIS — Z79899 Other long term (current) drug therapy: Secondary | ICD-10-CM | POA: Diagnosis not present

## 2013-10-08 DIAGNOSIS — I10 Essential (primary) hypertension: Secondary | ICD-10-CM | POA: Diagnosis not present

## 2013-10-08 DIAGNOSIS — Z7982 Long term (current) use of aspirin: Secondary | ICD-10-CM | POA: Diagnosis not present

## 2013-10-09 ENCOUNTER — Inpatient Hospital Stay: Payer: Self-pay | Admitting: Internal Medicine

## 2013-10-09 DIAGNOSIS — G4733 Obstructive sleep apnea (adult) (pediatric): Secondary | ICD-10-CM | POA: Diagnosis present

## 2013-10-09 DIAGNOSIS — E876 Hypokalemia: Secondary | ICD-10-CM | POA: Diagnosis not present

## 2013-10-09 DIAGNOSIS — Z6841 Body Mass Index (BMI) 40.0 and over, adult: Secondary | ICD-10-CM | POA: Diagnosis not present

## 2013-10-09 DIAGNOSIS — F329 Major depressive disorder, single episode, unspecified: Secondary | ICD-10-CM | POA: Diagnosis present

## 2013-10-09 DIAGNOSIS — I824Y9 Acute embolism and thrombosis of unspecified deep veins of unspecified proximal lower extremity: Secondary | ICD-10-CM | POA: Diagnosis present

## 2013-10-09 DIAGNOSIS — I82409 Acute embolism and thrombosis of unspecified deep veins of unspecified lower extremity: Secondary | ICD-10-CM | POA: Diagnosis not present

## 2013-10-09 DIAGNOSIS — K219 Gastro-esophageal reflux disease without esophagitis: Secondary | ICD-10-CM | POA: Diagnosis not present

## 2013-10-09 DIAGNOSIS — F3289 Other specified depressive episodes: Secondary | ICD-10-CM | POA: Diagnosis present

## 2013-10-09 DIAGNOSIS — E669 Obesity, unspecified: Secondary | ICD-10-CM | POA: Diagnosis present

## 2013-10-09 DIAGNOSIS — M79609 Pain in unspecified limb: Secondary | ICD-10-CM | POA: Diagnosis not present

## 2013-10-09 DIAGNOSIS — I10 Essential (primary) hypertension: Secondary | ICD-10-CM | POA: Diagnosis not present

## 2013-10-09 DIAGNOSIS — R0602 Shortness of breath: Secondary | ICD-10-CM | POA: Diagnosis not present

## 2013-10-09 DIAGNOSIS — I2699 Other pulmonary embolism without acute cor pulmonale: Secondary | ICD-10-CM | POA: Diagnosis not present

## 2013-10-09 LAB — CBC WITH DIFFERENTIAL/PLATELET
Basophil #: 0.1 10*3/uL (ref 0.0–0.1)
Basophil %: 1.4 %
Eosinophil #: 0.1 10*3/uL (ref 0.0–0.7)
Eosinophil %: 1.7 %
HCT: 39.3 % (ref 35.0–47.0)
HGB: 12.6 g/dL (ref 12.0–16.0)
Lymphocyte #: 2.7 10*3/uL (ref 1.0–3.6)
Lymphocyte %: 31.6 %
MCH: 27.6 pg (ref 26.0–34.0)
MCHC: 32 g/dL (ref 32.0–36.0)
MCV: 86 fL (ref 80–100)
MONO ABS: 0.4 x10 3/mm (ref 0.2–0.9)
MONOS PCT: 5.1 %
NEUTROS PCT: 60.2 %
Neutrophil #: 5.1 10*3/uL (ref 1.4–6.5)
PLATELETS: 212 10*3/uL (ref 150–440)
RBC: 4.56 10*6/uL (ref 3.80–5.20)
RDW: 15.2 % — ABNORMAL HIGH (ref 11.5–14.5)
WBC: 8.5 10*3/uL (ref 3.6–11.0)

## 2013-10-09 LAB — COMPREHENSIVE METABOLIC PANEL
ALT: 22 U/L (ref 12–78)
ANION GAP: 6 — AB (ref 7–16)
AST: 22 U/L (ref 15–37)
Albumin: 3.8 g/dL (ref 3.4–5.0)
Alkaline Phosphatase: 93 U/L
BILIRUBIN TOTAL: 0.6 mg/dL (ref 0.2–1.0)
BUN: 16 mg/dL (ref 7–18)
CALCIUM: 9.2 mg/dL (ref 8.5–10.1)
CO2: 27 mmol/L (ref 21–32)
CREATININE: 1.27 mg/dL (ref 0.60–1.30)
Chloride: 106 mmol/L (ref 98–107)
EGFR (African American): 58 — ABNORMAL LOW
GFR CALC NON AF AMER: 50 — AB
Glucose: 136 mg/dL — ABNORMAL HIGH (ref 65–99)
Osmolality: 281 (ref 275–301)
Potassium: 3.3 mmol/L — ABNORMAL LOW (ref 3.5–5.1)
Sodium: 139 mmol/L (ref 136–145)
Total Protein: 7.6 g/dL (ref 6.4–8.2)

## 2013-10-09 LAB — APTT
ACTIVATED PTT: 122.1 s — AB (ref 23.6–35.9)
Activated PTT: 151 secs — ABNORMAL HIGH (ref 23.6–35.9)
Activated PTT: 23.2 secs — ABNORMAL LOW (ref 23.6–35.9)

## 2013-10-09 LAB — PROTIME-INR
INR: 1
Prothrombin Time: 13.2 secs (ref 11.5–14.7)

## 2013-10-09 LAB — PRO B NATRIURETIC PEPTIDE: B-TYPE NATIURETIC PEPTID: 37 pg/mL (ref 0–125)

## 2013-10-09 LAB — TROPONIN I: Troponin-I: <0.02 ng/mL

## 2013-10-10 DIAGNOSIS — K219 Gastro-esophageal reflux disease without esophagitis: Secondary | ICD-10-CM | POA: Diagnosis not present

## 2013-10-10 DIAGNOSIS — I2699 Other pulmonary embolism without acute cor pulmonale: Secondary | ICD-10-CM | POA: Diagnosis not present

## 2013-10-10 DIAGNOSIS — E876 Hypokalemia: Secondary | ICD-10-CM | POA: Diagnosis not present

## 2013-10-10 LAB — PLATELET COUNT: Platelet: 217 10*3/uL (ref 150–440)

## 2013-10-10 LAB — HEMOGLOBIN: HGB: 12.3 g/dL (ref 12.0–16.0)

## 2013-10-21 DIAGNOSIS — M169 Osteoarthritis of hip, unspecified: Secondary | ICD-10-CM | POA: Diagnosis not present

## 2013-10-21 DIAGNOSIS — M76899 Other specified enthesopathies of unspecified lower limb, excluding foot: Secondary | ICD-10-CM | POA: Diagnosis not present

## 2013-10-21 DIAGNOSIS — M1610 Unilateral primary osteoarthritis, unspecified hip: Secondary | ICD-10-CM | POA: Diagnosis not present

## 2013-10-23 DIAGNOSIS — N946 Dysmenorrhea, unspecified: Secondary | ICD-10-CM | POA: Diagnosis not present

## 2013-10-23 DIAGNOSIS — N920 Excessive and frequent menstruation with regular cycle: Secondary | ICD-10-CM | POA: Diagnosis not present

## 2013-10-23 DIAGNOSIS — I2699 Other pulmonary embolism without acute cor pulmonale: Secondary | ICD-10-CM | POA: Diagnosis not present

## 2013-10-23 DIAGNOSIS — I749 Embolism and thrombosis of unspecified artery: Secondary | ICD-10-CM | POA: Diagnosis not present

## 2013-10-28 ENCOUNTER — Other Ambulatory Visit: Payer: Medicare Other

## 2013-11-04 ENCOUNTER — Telehealth: Payer: Self-pay | Admitting: Internal Medicine

## 2013-11-04 ENCOUNTER — Other Ambulatory Visit (INDEPENDENT_AMBULATORY_CARE_PROVIDER_SITE_OTHER): Payer: Medicare Other

## 2013-11-04 DIAGNOSIS — E876 Hypokalemia: Secondary | ICD-10-CM

## 2013-11-04 DIAGNOSIS — K5900 Constipation, unspecified: Secondary | ICD-10-CM | POA: Diagnosis not present

## 2013-11-04 DIAGNOSIS — R5381 Other malaise: Secondary | ICD-10-CM | POA: Diagnosis not present

## 2013-11-04 DIAGNOSIS — D509 Iron deficiency anemia, unspecified: Secondary | ICD-10-CM | POA: Diagnosis not present

## 2013-11-04 DIAGNOSIS — R5383 Other fatigue: Secondary | ICD-10-CM | POA: Diagnosis not present

## 2013-11-04 DIAGNOSIS — E538 Deficiency of other specified B group vitamins: Secondary | ICD-10-CM | POA: Diagnosis not present

## 2013-11-04 LAB — BASIC METABOLIC PANEL
BUN: 15 mg/dL (ref 6–23)
CO2: 27 mEq/L (ref 19–32)
CREATININE: 1 mg/dL (ref 0.4–1.2)
Calcium: 9.3 mg/dL (ref 8.4–10.5)
Chloride: 101 mEq/L (ref 96–112)
GFR: 77.04 mL/min (ref >60.00)
Glucose, Bld: 109 mg/dL — ABNORMAL HIGH (ref 70–99)
Potassium: 3.3 mEq/L — ABNORMAL LOW (ref 3.5–5.1)
Sodium: 136 mEq/L (ref 135–145)

## 2013-11-04 NOTE — Telephone Encounter (Signed)
Walk In pt Form " [facility]" records Dropped Off gave to [facility] 7.6.15/km

## 2013-11-04 NOTE — Telephone Encounter (Signed)
New problem    Per pt she was in [facility] for blood clots 3 wk's ago.   Pt will leave a copies the rec. Pt wants to know if she needs and appointment?

## 2013-11-06 ENCOUNTER — Emergency Department: Payer: Self-pay | Admitting: Emergency Medicine

## 2013-11-06 DIAGNOSIS — I10 Essential (primary) hypertension: Secondary | ICD-10-CM | POA: Diagnosis not present

## 2013-11-06 DIAGNOSIS — Z79899 Other long term (current) drug therapy: Secondary | ICD-10-CM | POA: Diagnosis not present

## 2013-11-06 DIAGNOSIS — R1110 Vomiting, unspecified: Secondary | ICD-10-CM | POA: Diagnosis not present

## 2013-11-06 DIAGNOSIS — R079 Chest pain, unspecified: Secondary | ICD-10-CM | POA: Diagnosis not present

## 2013-11-06 LAB — PROTIME-INR
INR: 1.1
Prothrombin Time: 13.7 secs (ref 11.5–14.7)

## 2013-11-06 LAB — BASIC METABOLIC PANEL
Anion Gap: 8 (ref 7–16)
BUN: 14 mg/dL (ref 7–18)
CHLORIDE: 102 mmol/L (ref 98–107)
CO2: 28 mmol/L (ref 21–32)
CREATININE: 1.04 mg/dL (ref 0.60–1.30)
Calcium, Total: 8.6 mg/dL (ref 8.5–10.1)
EGFR (African American): >60
EGFR (Non-African Amer.): >60
Glucose: 89 mg/dL (ref 65–99)
OSMOLALITY: 276 (ref 275–301)
POTASSIUM: 3.5 mmol/L (ref 3.5–5.1)
Sodium: 138 mmol/L (ref 136–145)

## 2013-11-06 LAB — TROPONIN I

## 2013-11-06 LAB — APTT: Activated PTT: 31.1 secs (ref 23.6–35.9)

## 2013-11-06 LAB — CBC
HCT: 38.2 % (ref 35.0–47.0)
HGB: 12.4 g/dL (ref 12.0–16.0)
MCH: 29.2 pg (ref 26.0–34.0)
MCHC: 32.5 g/dL (ref 32.0–36.0)
MCV: 90 fL (ref 80–100)
PLATELETS: 246 10*3/uL (ref 150–440)
RBC: 4.26 10*6/uL (ref 3.80–5.20)
RDW: 14.8 % — ABNORMAL HIGH (ref 11.5–14.5)
WBC: 6.7 10*3/uL (ref 3.6–11.0)

## 2013-11-07 DIAGNOSIS — M169 Osteoarthritis of hip, unspecified: Secondary | ICD-10-CM | POA: Diagnosis not present

## 2013-11-07 DIAGNOSIS — I10 Essential (primary) hypertension: Secondary | ICD-10-CM | POA: Diagnosis not present

## 2013-11-07 DIAGNOSIS — I2699 Other pulmonary embolism without acute cor pulmonale: Secondary | ICD-10-CM | POA: Diagnosis not present

## 2013-11-07 DIAGNOSIS — M1610 Unilateral primary osteoarthritis, unspecified hip: Secondary | ICD-10-CM | POA: Diagnosis not present

## 2013-11-12 ENCOUNTER — Encounter: Payer: Self-pay | Admitting: Internal Medicine

## 2013-11-12 ENCOUNTER — Ambulatory Visit (INDEPENDENT_AMBULATORY_CARE_PROVIDER_SITE_OTHER): Payer: Medicare Other | Admitting: Internal Medicine

## 2013-11-12 VITALS — BP 100/72 | HR 72 | Ht 64.0 in | Wt 293.5 lb

## 2013-11-12 DIAGNOSIS — R079 Chest pain, unspecified: Secondary | ICD-10-CM

## 2013-11-12 NOTE — Patient Instructions (Signed)
Your physician wants you to follow-up in: 9 MONTHS You will receive a reminder letter in the mail two months in advance. If you don't receive a letter, please call our office to schedule the follow-up appointment.  

## 2013-11-12 NOTE — Progress Notes (Signed)
Patient Care Team: Halina Maidens, MD as PCP - General (Family Medicine) Halina Maidens, MD (Internal Medicine)   HPI  [name] is a 48 y.o. 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 She takes Inderal. She has had a history of atypical chest pain. She underwent Myoview scanning 2011 which demonstrated no ischemia and normal left ventricular function   She is currently being diagnosed with bilateral pulmonary embolism related to left lower extremity DVT. It was ascribed to a prior fall.  She is on apixaban. She is also on aspirin     Past Medical History  Diagnosis Date  . Prolonged QT interval     gene positive  . HTN (hypertension)   . Atypical chest pain     negative Myoview in 2011 with no ischemi and normal LV function.   . Obesity   . OSA (obstructive sleep apnea)     uses C-PAP  . Anxiety   . Depression   . Shortness of breath     with exertion  . Peripheral vascular disease     blood clot in right leg  . GERD (gastroesophageal reflux disease)     uses Nexium  . Seizures     hasn't had any for 4 years  . Arthritis     osteoarthritis  . Anemia   . Cancer     polyps were malignant per pt.  . Clotting disorder     lungs  . DVT (deep venous thrombosis)   . PE (pulmonary embolism)     Past Surgical History  Procedure Laterality Date  . Total hip arthroplasty    . Lumbar disc surgery    . Left foot surgery    . Left hand surgery x 2    . Right hand surgery for torn ligament    . Colonoscopy    . Cardiac catheterization    . Dilation and curettage of uterus    . Anterior cervical decomp/discectomy fusion N/A 06/25/2013    Procedure: ANTERIOR CERVICAL DECOMPRESSION/DISCECTOMY FUSION 2 LEVELS LEVELS C FOUR/FIVE, SIX/SEVEN;  Surgeon: Floyce Stakes, MD;  Location: [facility];  Service: Neurosurgery;  Laterality: N/A;    Current Outpatient Prescriptions  Medication Sig Dispense Refill  .  apixaban (ELIQUIS) 5 MG TABS tablet Take 5 mg by mouth 2 (two) times daily.      Marland Kitchen aspirin 81 MG tablet Take 81 mg by mouth daily.        Marland Kitchen escitalopram (LEXAPRO) 20 MG tablet Take 20 mg by mouth daily.       Marland Kitchen esomeprazole (NEXIUM) 40 MG capsule Take 40 mg by mouth daily at 12 noon.      . Ferrous Sulfate (SLOW RELEASE IRON PO) Take by mouth daily.       . Glucosamine-Chondroit-Vit C-Mn (GLUCOSAMINE 1500 COMPLEX) CAPS Take 1 each by mouth daily.      Marland Kitchen labetalol (NORMODYNE) 200 MG tablet Take 1 tablet (200 mg total) by mouth 2 (two) times daily.  60 tablet  11  . lubiprostone (AMITIZA) 8 MCG capsule Take 8 mcg by mouth 2 (two) times daily with a meal.      . Multiple Vitamins-Minerals (MULTIVITAMIN WITH MINERALS) tablet Take 1 tablet by mouth daily.      . nabumetone (RELAFEN) 500 MG tablet Take 750 mg by mouth 2 (two) times daily.       . nabumetone (RELAFEN) 750 MG tablet Take 750  mg by mouth 2 (two) times daily.       . Omega-3 Fatty Acids (FISH OIL) 1000 MG CAPS Take 1 capsule by mouth 3 (three) times daily.        . Oxycodone HCl 10 MG TABS as needed.       . potassium chloride SA (K-DUR,KLOR-CON) 20 MEQ tablet Take 1 tablet (20 mEq total) by mouth daily.  30 tablet  5  . spironolactone (ALDACTONE) 25 MG tablet Take 1 tablet (25 mg total) by mouth daily.  30 tablet  5  . traMADol (ULTRAM) 50 MG tablet Take 50 mg by mouth as needed.       . triamterene-hydrochlorothiazide (MAXZIDE) 75-50 MG per tablet Take 1 tablet by mouth daily.      . VOLTAREN 1 % GEL Apply topically as needed.       . zolpidem (AMBIEN) 10 MG tablet Take 10 mg by mouth at bedtime as needed.         No current facility-administered medications for this visit.    No Known Allergies  Review of Systems negative except from HPI and PMH  Physical Exam BP 100/72  Pulse 72  Ht 5\' 4"  (1.626 m)  Wt 293 lb 8 oz (133.131 kg)  BMI 50.35 kg/m2 Well developed and well nourished in no acute distress HENT normal E scleral  and icterus clear Neck Supple JVP flat; carotids brisk and full Clear to ausculation  Regular rate and rhythm, no murmurs gallops or rub Soft with active bowel sounds No clubbing cyanosis none Edema Alert and oriented, grossly normal motor and sensory function Skin Warm and Dry  ECG February 2015  demonstrates sinus rhythm at 59 Intervals 04/11/47 with a QTC of 475  Assessment and  Plan  Long QT syndrome  Hypertension  Pulmonary embolism   orthostatic hypotension  She is intercurrently been diagnosed with sleep apnea. She is wearing her mask. Her orthostasis and blood pressure are much improved   would have a low  threshold for decreasing her labetalol.  She is to have her potassium rechecked in 6 months. She told it was normal. However, it was 3.3 and she had repeat blood work pending in 2 weeks.  No intercurrent ventricular arrhythmias

## 2013-11-13 NOTE — Telephone Encounter (Signed)
Pt seen in [place] 7/14

## 2013-11-25 DIAGNOSIS — M542 Cervicalgia: Secondary | ICD-10-CM | POA: Diagnosis not present

## 2013-11-25 DIAGNOSIS — Z6841 Body Mass Index (BMI) 40.0 and over, adult: Secondary | ICD-10-CM | POA: Diagnosis not present

## 2013-12-27 DIAGNOSIS — M169 Osteoarthritis of hip, unspecified: Secondary | ICD-10-CM | POA: Diagnosis not present

## 2013-12-27 DIAGNOSIS — M76899 Other specified enthesopathies of unspecified lower limb, excluding foot: Secondary | ICD-10-CM | POA: Diagnosis not present

## 2013-12-27 DIAGNOSIS — M1610 Unilateral primary osteoarthritis, unspecified hip: Secondary | ICD-10-CM | POA: Diagnosis not present

## 2014-02-06 ENCOUNTER — Encounter: Payer: Self-pay | Admitting: *Deleted

## 2014-02-06 ENCOUNTER — Encounter: Payer: Medicare Other | Admitting: Physician Assistant

## 2014-02-06 NOTE — Progress Notes (Signed)
Patient was a no show.   This encounter was created in error - please disregard. 

## 2014-03-09 ENCOUNTER — Emergency Department: Payer: Self-pay | Admitting: Emergency Medicine

## 2014-03-09 DIAGNOSIS — M549 Dorsalgia, unspecified: Secondary | ICD-10-CM | POA: Diagnosis not present

## 2014-03-09 DIAGNOSIS — M545 Low back pain: Secondary | ICD-10-CM | POA: Diagnosis not present

## 2014-03-09 DIAGNOSIS — Z3202 Encounter for pregnancy test, result negative: Secondary | ICD-10-CM | POA: Diagnosis not present

## 2014-03-09 DIAGNOSIS — Z79899 Other long term (current) drug therapy: Secondary | ICD-10-CM | POA: Diagnosis not present

## 2014-03-09 DIAGNOSIS — R609 Edema, unspecified: Secondary | ICD-10-CM | POA: Diagnosis not present

## 2014-03-09 DIAGNOSIS — R9431 Abnormal electrocardiogram [ECG] [EKG]: Secondary | ICD-10-CM | POA: Diagnosis not present

## 2014-03-09 DIAGNOSIS — I10 Essential (primary) hypertension: Secondary | ICD-10-CM | POA: Diagnosis not present

## 2014-03-09 DIAGNOSIS — R34 Anuria and oliguria: Secondary | ICD-10-CM | POA: Diagnosis not present

## 2014-03-09 DIAGNOSIS — R1084 Generalized abdominal pain: Secondary | ICD-10-CM | POA: Diagnosis not present

## 2014-03-09 DIAGNOSIS — M7989 Other specified soft tissue disorders: Secondary | ICD-10-CM | POA: Diagnosis not present

## 2014-03-09 DIAGNOSIS — Z8719 Personal history of other diseases of the digestive system: Secondary | ICD-10-CM | POA: Diagnosis not present

## 2014-03-09 DIAGNOSIS — Z7982 Long term (current) use of aspirin: Secondary | ICD-10-CM | POA: Diagnosis not present

## 2014-03-09 DIAGNOSIS — R109 Unspecified abdominal pain: Secondary | ICD-10-CM | POA: Diagnosis not present

## 2014-03-09 DIAGNOSIS — G8929 Other chronic pain: Secondary | ICD-10-CM | POA: Diagnosis not present

## 2014-03-09 DIAGNOSIS — Z791 Long term (current) use of non-steroidal anti-inflammatories (NSAID): Secondary | ICD-10-CM | POA: Diagnosis not present

## 2014-03-09 DIAGNOSIS — Z7902 Long term (current) use of antithrombotics/antiplatelets: Secondary | ICD-10-CM | POA: Diagnosis not present

## 2014-03-09 LAB — URINALYSIS, COMPLETE
BILIRUBIN, UR: NEGATIVE
BLOOD: NEGATIVE
Bacteria: NONE SEEN
Glucose,UR: NEGATIVE mg/dL (ref 0–75)
Ketone: NEGATIVE
Leukocyte Esterase: NEGATIVE
Nitrite: NEGATIVE
PH: 5 (ref 4.5–8.0)
Protein: NEGATIVE
RBC,UR: 2 /HPF (ref 0–5)
Specific Gravity: 1.03 (ref 1.003–1.030)

## 2014-03-09 LAB — COMPREHENSIVE METABOLIC PANEL
ALBUMIN: 3.3 g/dL — AB (ref 3.4–5.0)
ALT: 22 U/L
Alkaline Phosphatase: 70 U/L
Anion Gap: 2 — ABNORMAL LOW (ref 7–16)
BILIRUBIN TOTAL: 0.3 mg/dL (ref 0.2–1.0)
BUN: 15 mg/dL (ref 7–18)
CALCIUM: 8.3 mg/dL — AB (ref 8.5–10.1)
Chloride: 114 mmol/L — ABNORMAL HIGH (ref 98–107)
Co2: 25 mmol/L (ref 21–32)
Creatinine: 0.94 mg/dL (ref 0.60–1.30)
Glucose: 98 mg/dL (ref 65–99)
Osmolality: 282 (ref 275–301)
Potassium: 3.8 mmol/L (ref 3.5–5.1)
SGOT(AST): 26 U/L (ref 15–37)
Sodium: 141 mmol/L (ref 136–145)
TOTAL PROTEIN: 6.8 g/dL (ref 6.4–8.2)

## 2014-03-09 LAB — CBC
HCT: 36.7 % (ref 35.0–47.0)
HGB: 12.1 g/dL (ref 12.0–16.0)
MCH: 28.9 pg (ref 26.0–34.0)
MCHC: 32.9 g/dL (ref 32.0–36.0)
MCV: 88 fL (ref 80–100)
Platelet: 245 10*3/uL (ref 150–440)
RBC: 4.17 10*6/uL (ref 3.80–5.20)
RDW: 14.2 % (ref 11.5–14.5)
WBC: 5.5 10*3/uL (ref 3.6–11.0)

## 2014-03-09 LAB — LIPASE, BLOOD: LIPASE: 120 U/L (ref 73–393)

## 2014-03-13 DIAGNOSIS — R600 Localized edema: Secondary | ICD-10-CM | POA: Diagnosis not present

## 2014-03-13 DIAGNOSIS — I2699 Other pulmonary embolism without acute cor pulmonale: Secondary | ICD-10-CM | POA: Diagnosis not present

## 2014-03-13 DIAGNOSIS — I10 Essential (primary) hypertension: Secondary | ICD-10-CM | POA: Diagnosis not present

## 2014-03-13 DIAGNOSIS — F5101 Primary insomnia: Secondary | ICD-10-CM | POA: Diagnosis not present

## 2014-03-13 LAB — TSH: TSH: 0.8 u[IU]/mL (ref <=5.90)

## 2014-03-31 ENCOUNTER — Telehealth: Payer: Self-pay | Admitting: Internal Medicine

## 2014-03-31 NOTE — Telephone Encounter (Signed)
F/U  Patient returning call and would like to be contacted at [phone].

## 2014-03-31 NOTE — Telephone Encounter (Signed)
New message           C/o dizziness and high blood pressure when she sits down

## 2014-03-31 NOTE — Telephone Encounter (Signed)
Patient explains that her BP is going up after she sits for 30-45 min. States it is averaging 893-734 systolic.  Having dizzy spells. Experiencing SOB doing dishes, ambulating, walking up stairs. Also c/o of chest pain when putting on shoes or gets out of breath. She can't wear jeans secondary to weight gain. Weight went from 280-311 pds during last few months. She was started on Lasix 2 weeks ago and sees no improvement, "I can't get rid of the fluid". However, she claims to urinate once/twice daily. Advised would review with Dr. Caryl Comes for his recommendations. She is agreeable to this.

## 2014-04-02 NOTE — Telephone Encounter (Signed)
Advised patient to contact ordering physician of Lasix (she tells me it is her PCP, Army Melia). Explained that I could arrange for her to see PA/NP in our office (per Caryl Comes suggestion if she wants) - but patient would like to discuss with her PCP first. Patient will call us back if she would like to be seen in our office.

## 2014-04-08 ENCOUNTER — Other Ambulatory Visit: Payer: Self-pay

## 2014-04-08 MED ORDER — POTASSIUM CHLORIDE CRYS ER 20 MEQ PO TBCR: 20.0000 meq | EXTENDED_RELEASE_TABLET | Freq: Every day | ORAL | Status: DC

## 2014-05-08 ENCOUNTER — Other Ambulatory Visit: Payer: Self-pay | Admitting: *Deleted

## 2014-05-08 MED ORDER — SPIRONOLACTONE 25 MG PO TABS: 25.0000 mg | ORAL_TABLET | Freq: Every day | ORAL | Status: DC

## 2014-05-22 DIAGNOSIS — M5410 Radiculopathy, site unspecified: Secondary | ICD-10-CM | POA: Diagnosis not present

## 2014-05-22 DIAGNOSIS — Z6841 Body Mass Index (BMI) 40.0 and over, adult: Secondary | ICD-10-CM | POA: Diagnosis not present

## 2014-05-22 DIAGNOSIS — I10 Essential (primary) hypertension: Secondary | ICD-10-CM | POA: Diagnosis not present

## 2014-06-04 ENCOUNTER — Other Ambulatory Visit: Payer: Self-pay | Admitting: Neurosurgery

## 2014-06-04 DIAGNOSIS — M5410 Radiculopathy, site unspecified: Secondary | ICD-10-CM

## 2014-06-13 ENCOUNTER — Ambulatory Visit
Admission: RE | Admit: 2014-06-13 | Discharge: 2014-06-13 | Disposition: A | Discharge status: Home / Self Care | Payer: Medicare Other | Source: Ambulatory Visit | Attending: Neurosurgery | Admitting: Neurosurgery

## 2014-06-13 DIAGNOSIS — M5410 Radiculopathy, site unspecified: Secondary | ICD-10-CM

## 2014-06-13 DIAGNOSIS — M4802 Spinal stenosis, cervical region: Secondary | ICD-10-CM | POA: Diagnosis not present

## 2014-06-13 DIAGNOSIS — M5032 Other cervical disc degeneration, mid-cervical region: Secondary | ICD-10-CM | POA: Diagnosis not present

## 2014-06-13 MED ORDER — MEPERIDINE HCL 100 MG/ML IJ SOLN
50.0000 mg | Freq: Once | INTRAMUSCULAR | Status: AC
  Administered 2014-06-13: 50 mg via INTRAMUSCULAR

## 2014-06-13 MED ORDER — ONDANSETRON HCL 4 MG/2ML IJ SOLN: 4.0000 mg | Freq: Four times a day (QID) | INTRAMUSCULAR | Status: DC | PRN

## 2014-06-13 MED ORDER — DIAZEPAM 5 MG PO TABS
10.0000 mg | ORAL_TABLET | Freq: Once | ORAL | Status: AC
  Administered 2014-06-13: 10 mg via ORAL

## 2014-06-13 MED ORDER — ONDANSETRON HCL 4 MG/2ML IJ SOLN
4.0000 mg | Freq: Once | INTRAMUSCULAR | Status: AC
  Administered 2014-06-13: 4 mg via INTRAMUSCULAR

## 2014-06-13 MED ORDER — IOHEXOL 300 MG/ML  SOLN
10.0000 mL | Freq: Once | INTRAMUSCULAR | Status: AC | PRN
  Administered 2014-06-13: 10 mL via INTRATHECAL

## 2014-06-13 NOTE — Progress Notes (Signed)
Pt states she has been off Lexapro since Tuesday or Wednesday this week.  Discharge instructions explained to pt.

## 2014-06-13 NOTE — Discharge Instructions (Signed)
Myelogram Discharge Instructions  1. Go home and rest quietly for the next 24 hours.  It is important to lie flat for the next 24 hours.  Get up only to go to the restroom.  You may lie in the bed or on a couch on your back, your stomach, your left side or your right side.  You may have one pillow under your head.  You may have pillows between your knees while you are on your side or under your knees while you are on your back.  2. DO NOT drive today.  Recline the seat as far back as it will go, while still wearing your seat belt, on the way home.  3. You may get up to go to the bathroom as needed.  You may sit up for 10 minutes to eat.  You may resume your normal diet and medications unless otherwise indicated.  Drink lots of extra fluids today and tomorrow.  4. The incidence of headache, nausea, or vomiting is about 5% (one in 20 patients).  If you develop a headache, lie flat and drink plenty of fluids until the headache goes away.  Caffeinated beverages may be helpful.  If you develop severe nausea and vomiting or a headache that does not go away with flat bed rest, call [phone].  5. You may resume normal activities after your 24 hours of bed rest is over; however, do not exert yourself strongly or do any heavy lifting tomorrow. If when you get up you have a headache when standing, go back to bed and force fluids for another 24 hours.  6. Call your physician for a follow-up appointment.  The results of your myelogram will be sent directly to your physician by the following day.  7. If you have any questions or if complications develop after you arrive home, please call [phone].  Discharge instructions have been explained to the patient.  The patient, or the person responsible for the patient, fully understands these instructions.       May resume Lexapro on Feb. 13, 2016, after 9:30 am.

## 2014-06-14 ENCOUNTER — Emergency Department (HOSPITAL_COMMUNITY)
Admission: EM | Admit: 2014-06-14 | Discharge: 2014-06-14 | Disposition: A | Discharge status: Home / Self Care | Payer: Medicare Other | Attending: Emergency Medicine | Admitting: Emergency Medicine

## 2014-06-14 DIAGNOSIS — M545 Low back pain, unspecified: Secondary | ICD-10-CM

## 2014-06-14 DIAGNOSIS — G40909 Epilepsy, unspecified, not intractable, without status epilepticus: Secondary | ICD-10-CM | POA: Insufficient documentation

## 2014-06-14 DIAGNOSIS — Z86718 Personal history of other venous thrombosis and embolism: Secondary | ICD-10-CM | POA: Diagnosis not present

## 2014-06-14 DIAGNOSIS — K219 Gastro-esophageal reflux disease without esophagitis: Secondary | ICD-10-CM | POA: Insufficient documentation

## 2014-06-14 DIAGNOSIS — Z981 Arthrodesis status: Secondary | ICD-10-CM | POA: Diagnosis not present

## 2014-06-14 DIAGNOSIS — G8918 Other acute postprocedural pain: Secondary | ICD-10-CM | POA: Insufficient documentation

## 2014-06-14 DIAGNOSIS — Z859 Personal history of malignant neoplasm, unspecified: Secondary | ICD-10-CM | POA: Diagnosis not present

## 2014-06-14 DIAGNOSIS — Z7982 Long term (current) use of aspirin: Secondary | ICD-10-CM | POA: Insufficient documentation

## 2014-06-14 DIAGNOSIS — F329 Major depressive disorder, single episode, unspecified: Secondary | ICD-10-CM | POA: Diagnosis not present

## 2014-06-14 DIAGNOSIS — I10 Essential (primary) hypertension: Secondary | ICD-10-CM | POA: Insufficient documentation

## 2014-06-14 DIAGNOSIS — F419 Anxiety disorder, unspecified: Secondary | ICD-10-CM | POA: Diagnosis not present

## 2014-06-14 DIAGNOSIS — Z86711 Personal history of pulmonary embolism: Secondary | ICD-10-CM | POA: Diagnosis not present

## 2014-06-14 DIAGNOSIS — E669 Obesity, unspecified: Secondary | ICD-10-CM | POA: Diagnosis not present

## 2014-06-14 DIAGNOSIS — M542 Cervicalgia: Secondary | ICD-10-CM | POA: Insufficient documentation

## 2014-06-14 DIAGNOSIS — Z79899 Other long term (current) drug therapy: Secondary | ICD-10-CM | POA: Insufficient documentation

## 2014-06-14 DIAGNOSIS — D649 Anemia, unspecified: Secondary | ICD-10-CM | POA: Insufficient documentation

## 2014-06-14 DIAGNOSIS — G4733 Obstructive sleep apnea (adult) (pediatric): Secondary | ICD-10-CM | POA: Diagnosis not present

## 2014-06-14 DIAGNOSIS — Z9981 Dependence on supplemental oxygen: Secondary | ICD-10-CM | POA: Diagnosis not present

## 2014-06-14 DIAGNOSIS — M1990 Unspecified osteoarthritis, unspecified site: Secondary | ICD-10-CM | POA: Insufficient documentation

## 2014-06-14 MED ORDER — HYDROCODONE-ACETAMINOPHEN 5-325 MG PO TABS: 1.0000 | ORAL_TABLET | Freq: Four times a day (QID) | ORAL | Status: DC | PRN

## 2014-06-14 MED ORDER — HYDROCODONE-ACETAMINOPHEN 5-325 MG PO TABS
1.0000 | ORAL_TABLET | Freq: Once | ORAL | Status: AC
  Administered 2014-06-14: 1 via ORAL
  Filled 2014-06-14: qty 1

## 2014-06-14 NOTE — Discharge Instructions (Signed)
Rest in position of comfort for the next 2 days. Take pain medicine as needed for the back pain. Return for development of any incontinence numbness in your feet or weakness in your legs or feet.

## 2014-06-14 NOTE — ED Notes (Addendum)
S/p cervical lumbar spinal puncture to dx. Why pt. Is having neck pain.

## 2014-06-14 NOTE — ED Provider Notes (Signed)
CSN: [id number]     Arrival date & time 06/14/14  1400 History   First MD Initiated Contact with Patient 06/14/14 1445     Chief Complaint  Patient presents with  . Back Pain  . Follow-up     (Consider location/radiation/quality/duration/timing/severity/associated sxs/prior Treatment) Patient is a 49 y.o. female presenting with back pain. The history is provided by the patient.  Back Pain Associated symptoms: no abdominal pain, no chest pain, no dysuria, no fever, no headaches, no numbness and no weakness    patient status post cervical myelogram yesterday. Patient as per structures has been laying flat for 24 hours. Patient developed low back pain today. Patient without any fall or injury. Patient without any trouble with headaches. Patient has had some numbness in the right hand no numbness in the right leg or left leg and no weakness in the legs no incontinence. Patient states that the back pain is 8 out of 10.  Past Medical History  Diagnosis Date  . Prolonged QT interval     gene positive  . HTN (hypertension)   . Atypical chest pain     negative Myoview in 2011 with no ischemi and normal LV function.   . Obesity   . OSA (obstructive sleep apnea)     uses C-PAP  . Anxiety   . Depression   . Shortness of breath     with exertion  . Peripheral vascular disease     blood clot in right leg  . GERD (gastroesophageal reflux disease)     uses Nexium  . Seizures     hasn't had any for 4 years  . Arthritis     osteoarthritis  . Anemia   . Cancer     polyps were malignant per pt.  . Clotting disorder     lungs  . DVT (deep venous thrombosis)   . PE (pulmonary embolism)    Past Surgical History  Procedure Laterality Date  . Total hip arthroplasty    . Lumbar disc surgery    . Left foot surgery    . Left hand surgery x 2    . Right hand surgery for torn ligament    . Colonoscopy    . Cardiac catheterization    . Dilation and curettage of uterus    . Anterior cervical  decomp/discectomy fusion N/A 06/25/2013    Procedure: ANTERIOR CERVICAL DECOMPRESSION/DISCECTOMY FUSION 2 LEVELS LEVELS C FOUR/FIVE, SIX/SEVEN;  Surgeon: Floyce Stakes, MD;  Location: [facility];  Service: Neurosurgery;  Laterality: N/A;   Family History  Problem Relation Age of Onset  . Family history unknown: Yes   History  Substance Use Topics  . Smoking status: Former Smoker    Quit date: 08/16/2001  . Smokeless tobacco: Never Used  . Alcohol Use: Yes   OB History    No data available     Review of Systems  Constitutional: Negative for fever.  HENT: Negative for congestion.   Eyes: Negative for redness.  Respiratory: Negative for shortness of breath.   Cardiovascular: Negative for chest pain.  Gastrointestinal: Negative for nausea, vomiting and abdominal pain.  Genitourinary: Negative for dysuria and difficulty urinating.  Musculoskeletal: Positive for back pain and neck pain.  Skin: Negative for rash.  Neurological: Negative for weakness, numbness and headaches.  Hematological: Does not bruise/bleed easily.  Psychiatric/Behavioral: Negative for confusion.      Allergies  Review of patient's allergies indicates no known allergies.  Home Medications   Prior  to Admission medications   Medication Sig Start Date End Date Taking? Authorizing Provider  albuterol (PROVENTIL HFA;VENTOLIN HFA) 108 (90 BASE) MCG/ACT inhaler Inhale 1-2 puffs into the lungs every 4 (four) hours as needed for wheezing or shortness of breath.   Yes Historical Provider, MD  aspirin 81 MG tablet Take 81 mg by mouth daily.     Yes Historical Provider, MD  budesonide-formoterol (SYMBICORT) 160-4.5 MCG/ACT inhaler Inhale 2 puffs into the lungs 2 (two) times daily as needed (for shortness of breath).   Yes Historical Provider, MD  diazepam (VALIUM) 10 MG tablet Take 10 mg by mouth 3 (three) times daily. 05/22/14  Yes Historical Provider, MD  escitalopram (LEXAPRO) 20 MG tablet Take 20 mg by mouth  daily.  10/03/13  Yes Historical Provider, MD  esomeprazole (NEXIUM) 40 MG capsule Take 40 mg by mouth daily at 12 noon.   Yes Historical Provider, MD  Ferrous Sulfate (SLOW RELEASE IRON PO) Take 1 tablet by mouth daily.    Yes Historical Provider, MD  Glucosamine-Chondroit-Vit C-Mn (GLUCOSAMINE 1500 COMPLEX) CAPS Take 1 each by mouth daily.   Yes Historical Provider, MD  hyoscyamine (LEVSIN SL) 0.125 MG SL tablet Take 1 tablet by mouth 2 (two) times daily as needed. For nausea 04/03/14  Yes Historical Provider, MD  labetalol (NORMODYNE) 200 MG tablet Take 1 tablet (200 mg total) by mouth 2 (two) times daily. 07/31/13  Yes Deboraha Sprang, MD  Multiple Vitamins-Minerals (MULTIVITAMIN WITH MINERALS) tablet Take 1 tablet by mouth daily.   Yes Historical Provider, MD  nabumetone (RELAFEN) 750 MG tablet Take 750 mg by mouth 2 (two) times daily.  11/07/13  Yes Historical Provider, MD  Omega-3 Fatty Acids (FISH OIL) 1000 MG CAPS Take 1 capsule by mouth 3 (three) times daily.     Yes Historical Provider, MD  Oxycodone HCl 10 MG TABS Take 10 mg by mouth 3 (three) times daily as needed (for pain).  11/07/13  Yes Historical Provider, MD  potassium chloride SA (K-DUR,KLOR-CON) 20 MEQ tablet Take 1 tablet (20 mEq total) by mouth daily. 04/08/14  Yes Thayer Headings, MD  spironolactone (ALDACTONE) 25 MG tablet Take 1 tablet (25 mg total) by mouth daily. 05/08/14  Yes Deboraha Sprang, MD  triamterene-hydrochlorothiazide (MAXZIDE) 75-50 MG per tablet Take 1 tablet by mouth daily.   Yes Historical Provider, MD  VOLTAREN 1 % GEL Apply 2 g topically 4 (four) times daily as needed (for pain).  10/31/13  Yes Historical Provider, MD  zolpidem (AMBIEN) 10 MG tablet Take 10 mg by mouth at bedtime as needed.     Yes Historical Provider, MD  HYDROcodone-acetaminophen (NORCO/VICODIN) 5-325 MG per tablet Take 1-2 tablets by mouth every 6 (six) hours as needed for moderate pain. 06/14/14   Fredia Sorrow, MD   BP 116/50 mmHg  Pulse 89   Temp(Src) 97.8 F (36.6 C)  Resp 22  Ht 5\' 5"  (1.651 m)  Wt 307 lb (139.254 kg)  BMI 51.09 kg/m2  SpO2 99%  LMP 06/06/2014 Physical Exam  Constitutional: She is oriented to person, place, and time. She appears well-developed and well-nourished. No distress.  HENT:  Head: Normocephalic and atraumatic.  Mouth/Throat: Oropharynx is clear and moist.  Eyes: Conjunctivae and EOM are normal. Pupils are equal, round, and reactive to light.  Neck: Normal range of motion.  Cardiovascular: Normal rate, regular rhythm and normal heart sounds.   Pulmonary/Chest: Effort normal and breath sounds normal. No respiratory distress.  Abdominal: Soft. Bowel  sounds are normal. There is no tenderness.  Musculoskeletal: Normal range of motion.  Neurological: She is alert and oriented to person, place, and time. No cranial nerve deficit. She exhibits normal muscle tone. Coordination normal.  Skin: Skin is warm. No rash noted.  Nursing note and vitals reviewed.   ED Course  Procedures (including critical care time) Labs Review Labs Reviewed - No data to display  Imaging Review Ct Cervical Spine W Contrast  06/13/2014   CLINICAL DATA:  Pain in the lower neck and between the scapulae. Left hand tremors.  FLUOROSCOPY TIME:  Fluoroscopy Time (in minutes and seconds): 0 minutes 53 seconds  Number of Acquired Images:  12  PROCEDURE: LUMBAR PUNCTURE FOR CERVICAL MYELOGRAM  After thorough discussion of risks and benefits of the procedure including bleeding, infection, injury to nerves, blood vessels, adjacent structures as well as headache and CSF leak, written and oral informed consent was obtained. Consent was obtained by Dr. Logan Bores. We discussed the high likelihood of obtaining a diagnostic study.  Patient was positioned prone on the fluoroscopy table. Local anesthesia was provided with 1% lidocaine without epinephrine after prepped and draped in the usual sterile fashion. Puncture was performed at L3-4 using a  6 inch 22-gauge spinal needle via a left paramedian approach. Using a single pass through the dura, the needle was placed within the thecal sac, with return of clear CSF. 10 mL of Omnipaque-300 was injected into the thecal sac, with normal opacification of the nerve roots and cauda equina consistent with free flow within the subarachnoid space. The patient was then moved to the trendelenburg position and contrast flowed into the Cervical spine region.  I personally performed the lumbar puncture and administered the intrathecal contrast. I also personally supervised acquisition of the myelogram images.  TECHNIQUE: Contiguous axial images were obtained through the Cervical spine after the intrathecal infusion of contrast. Coronal and sagittal reconstructions were obtained of the axial image sets.  COMPARISON:  Cervical spine MRI 04/30/2013  FINDINGS: CERVICAL MYELOGRAM FINDINGS:  Sequelae of interval C4-5 and C6-7 ACDF are identified. There is slight retrolisthesis of C4 on C5 without significant change during flexion or extension. There is moderate disc space narrowing with endplate spurring at G6-2, with a moderate size ventral extradural defect at this level resulting in mild-to-moderate spinal canal narrowing. There is mild spinal canal narrowing at C5-6 due to a ventral extradural defect. Multiple small nerve root sleeve diverticula are noted bilaterally, most prominently on the left at C5-6 and C7-T1.  CT CERVICAL MYELOGRAM FINDINGS:  There is minimal retrolisthesis of C4 on C5 of 2 mm. Sequelae of C4-5 and C6-7 ACDF are identified. Small areas of osseous fusion of are suspected across the disc space at both levels, although evaluation is partially limited by streak artifact. There is no evidence of screw loosening or fracture. Vertebral body heights are preserved. There is moderate disc space narrowing at C3-4. Cervical spinal cord is normal in caliber. Paraspinal soft tissues are unremarkable.  C2-3:   Negative.  C3-4: Moderate-sized, broad left central disc protrusion has increased in size from the prior study and results in moderate spinal stenosis with mild impression on the ventral spinal cord. Mild uncovertebral spurring results in minimal bilateral neural foraminal narrowing.  C4-5: Interval ACDF. No significant residual spinal stenosis. Uncovertebral spurring results in mild to moderate left neural foraminal stenosis, similar to prior. No right neural foraminal stenosis.  C5-6: Broad central disc protrusion is similar to the prior MRI without stenosis.  C6-7: Interval ACDF. No residual spinal stenosis. Minimal left neural foraminal narrowing.  C7-T1: Tiny left central spur/ calcified disc protrusion without stenosis.  IMPRESSION: 1. Interval C4-5 and C6-7 ACDF without residual spinal stenosis. Mild-to-moderate left neural foraminal narrowing at C4-5. 2. Progressive adjacent segment disease at C3-4 resulting in moderate spinal stenosis. 3. Mild disc degeneration at C5-6 without significant stenosis.   Electronically Signed   By: Logan Bores   On: 06/13/2014 11:39   Dg Myelography Lumbar Inj Cervical  06/13/2014   CLINICAL DATA:  Pain in the lower neck and between the scapulae. Left hand tremors.  FLUOROSCOPY TIME:  Fluoroscopy Time (in minutes and seconds): 0 minutes 53 seconds  Number of Acquired Images:  12  PROCEDURE: LUMBAR PUNCTURE FOR CERVICAL MYELOGRAM  After thorough discussion of risks and benefits of the procedure including bleeding, infection, injury to nerves, blood vessels, adjacent structures as well as headache and CSF leak, written and oral informed consent was obtained. Consent was obtained by Dr. Logan Bores. We discussed the high likelihood of obtaining a diagnostic study.  Patient was positioned prone on the fluoroscopy table. Local anesthesia was provided with 1% lidocaine without epinephrine after prepped and draped in the usual sterile fashion. Puncture was performed at L3-4 using  a 6 inch 22-gauge spinal needle via a left paramedian approach. Using a single pass through the dura, the needle was placed within the thecal sac, with return of clear CSF. 10 mL of Omnipaque-300 was injected into the thecal sac, with normal opacification of the nerve roots and cauda equina consistent with free flow within the subarachnoid space. The patient was then moved to the trendelenburg position and contrast flowed into the Cervical spine region.  I personally performed the lumbar puncture and administered the intrathecal contrast. I also personally supervised acquisition of the myelogram images.  TECHNIQUE: Contiguous axial images were obtained through the Cervical spine after the intrathecal infusion of contrast. Coronal and sagittal reconstructions were obtained of the axial image sets.  COMPARISON:  Cervical spine MRI 04/30/2013  FINDINGS: CERVICAL MYELOGRAM FINDINGS:  Sequelae of interval C4-5 and C6-7 ACDF are identified. There is slight retrolisthesis of C4 on C5 without significant change during flexion or extension. There is moderate disc space narrowing with endplate spurring at X3-2, with a moderate size ventral extradural defect at this level resulting in mild-to-moderate spinal canal narrowing. There is mild spinal canal narrowing at C5-6 due to a ventral extradural defect. Multiple small nerve root sleeve diverticula are noted bilaterally, most prominently on the left at C5-6 and C7-T1.  CT CERVICAL MYELOGRAM FINDINGS:  There is minimal retrolisthesis of C4 on C5 of 2 mm. Sequelae of C4-5 and C6-7 ACDF are identified. Small areas of osseous fusion of are suspected across the disc space at both levels, although evaluation is partially limited by streak artifact. There is no evidence of screw loosening or fracture. Vertebral body heights are preserved. There is moderate disc space narrowing at C3-4. Cervical spinal cord is normal in caliber. Paraspinal soft tissues are unremarkable.  C2-3:   Negative.  C3-4: Moderate-sized, broad left central disc protrusion has increased in size from the prior study and results in moderate spinal stenosis with mild impression on the ventral spinal cord. Mild uncovertebral spurring results in minimal bilateral neural foraminal narrowing.  C4-5: Interval ACDF. No significant residual spinal stenosis. Uncovertebral spurring results in mild to moderate left neural foraminal stenosis, similar to prior. No right neural foraminal stenosis.  C5-6: Broad central disc protrusion  is similar to the prior MRI without stenosis.  C6-7: Interval ACDF. No residual spinal stenosis. Minimal left neural foraminal narrowing.  C7-T1: Tiny left central spur/ calcified disc protrusion without stenosis.  IMPRESSION: 1. Interval C4-5 and C6-7 ACDF without residual spinal stenosis. Mild-to-moderate left neural foraminal narrowing at C4-5. 2. Progressive adjacent segment disease at C3-4 resulting in moderate spinal stenosis. 3. Mild disc degeneration at C5-6 without significant stenosis.   Electronically Signed   By: Logan Bores   On: 06/13/2014 11:39     EKG Interpretation None      MDM   Final diagnoses:  Bilateral low back pain without sciatica    Patient status post cervical myelogram yesterday. Done by interventional radiology. Patient did follow instructions and did lay flat for 24 hours. No significant neck pain or headache at this point in time. Patient however though did develop low back pain. Did call the help line for her interventional radiology and was told to come in. Patient was discussed with Dr. Beatris Ship radiology review reviewed her myelogram. And stated that probably the low back pain is unrelated and may be related to tach she had a lay flat. Patient has had fusions in those areas in the past. Patient has had no fall or injury. Patient has no pain going down the legs patient has no incontinence has no numbness or weakness to the legs.  Radiology recommended  symptomatic treatment.   Fredia Sorrow, MD 06/14/14 [id number]

## 2014-06-20 DIAGNOSIS — Z6841 Body Mass Index (BMI) 40.0 and over, adult: Secondary | ICD-10-CM | POA: Diagnosis not present

## 2014-06-20 DIAGNOSIS — M5410 Radiculopathy, site unspecified: Secondary | ICD-10-CM | POA: Diagnosis not present

## 2014-07-07 DIAGNOSIS — M7062 Trochanteric bursitis, left hip: Secondary | ICD-10-CM | POA: Diagnosis not present

## 2014-07-07 DIAGNOSIS — M1612 Unilateral primary osteoarthritis, left hip: Secondary | ICD-10-CM | POA: Diagnosis not present

## 2014-07-31 ENCOUNTER — Encounter: Payer: Self-pay | Admitting: *Deleted

## 2014-08-05 ENCOUNTER — Encounter: Payer: Self-pay | Admitting: Internal Medicine

## 2014-08-05 ENCOUNTER — Ambulatory Visit (INDEPENDENT_AMBULATORY_CARE_PROVIDER_SITE_OTHER): Payer: Medicare Other | Admitting: Internal Medicine

## 2014-08-05 VITALS — BP 136/87 | HR 66 | Ht 64.0 in | Wt 295.6 lb

## 2014-08-05 DIAGNOSIS — Z79899 Other long term (current) drug therapy: Secondary | ICD-10-CM | POA: Diagnosis not present

## 2014-08-05 DIAGNOSIS — I4581 Long QT syndrome: Secondary | ICD-10-CM

## 2014-08-05 DIAGNOSIS — R42 Dizziness and giddiness: Secondary | ICD-10-CM

## 2014-08-05 NOTE — Progress Notes (Signed)
Patient Care Team: Glean Hess, MD as PCP - General (Family Medicine) Glean Hess, MD (Internal Medicine)   HPI  [name] is a 49 y.o. 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 She takes Inderal. She has had a history of atypical chest pain. She underwent Myoview scanning 2011 which demonstrated no ischemia and normal left ventricular function   She is currently being diagnosed with bilateral pulmonary embolism related to left lower extremity DVT. It was ascribed to a prior fall.  She is on apixaban. She is also on aspirin     Past Medical History  Diagnosis Date  . Prolonged QT interval     gene positive  . HTN (hypertension)   . Atypical chest pain     negative Myoview in 2011 with no ischemi and normal LV function.   . Obesity   . OSA (obstructive sleep apnea)     uses C-PAP  . Anxiety   . Depression   . Shortness of breath     with exertion  . Peripheral vascular disease     blood clot in right leg  . GERD (gastroesophageal reflux disease)     uses Nexium  . Seizures     hasn't had any for 4 years  . Arthritis     osteoarthritis  . Anemia   . Cancer     polyps were malignant per pt.  . Clotting disorder     lungs  . DVT (deep venous thrombosis)   . PE (pulmonary embolism)     Past Surgical History  Procedure Laterality Date  . Total hip arthroplasty    . Lumbar disc surgery    . Left foot surgery    . Left hand surgery x 2    . Right hand surgery for torn ligament    . Colonoscopy    . Cardiac catheterization    . Dilation and curettage of uterus    . Anterior cervical decomp/discectomy fusion N/A 06/25/2013    Procedure: ANTERIOR CERVICAL DECOMPRESSION/DISCECTOMY FUSION 2 LEVELS LEVELS C FOUR/FIVE, SIX/SEVEN;  Surgeon: Floyce Stakes, MD;  Location: [facility];  Service: Neurosurgery;  Laterality: N/A;    Current Outpatient Prescriptions  Medication Sig Dispense Refill    . albuterol (PROVENTIL HFA;VENTOLIN HFA) 108 (90 BASE) MCG/ACT inhaler Inhale 1-2 puffs into the lungs every 4 (four) hours as needed for wheezing or shortness of breath.    Marland Kitchen aspirin 81 MG tablet Take 81 mg by mouth daily.      . budesonide-formoterol (SYMBICORT) 160-4.5 MCG/ACT inhaler Inhale 2 puffs into the lungs 2 (two) times daily as needed (for shortness of breath).    . diazepam (VALIUM) 10 MG tablet Take 10 mg by mouth 3 (three) times daily.    Marland Kitchen escitalopram (LEXAPRO) 20 MG tablet Take 20 mg by mouth daily.     Marland Kitchen esomeprazole (NEXIUM) 40 MG capsule Take 40 mg by mouth daily at 12 noon.    . Ferrous Sulfate (SLOW RELEASE IRON PO) Take 1 tablet by mouth daily.     . Glucosamine-Chondroit-Vit C-Mn (GLUCOSAMINE 1500 COMPLEX) CAPS Take 1 each by mouth daily.    Marland Kitchen HYDROcodone-acetaminophen (NORCO/VICODIN) 5-325 MG per tablet Take 1-2 tablets by mouth every 6 (six) hours as needed for moderate pain. 20 tablet 0  . hyoscyamine (LEVSIN SL) 0.125 MG SL tablet Take 1 tablet by mouth 2 (two) times daily as needed. For nausea    .  labetalol (NORMODYNE) 200 MG tablet Take 1 tablet (200 mg total) by mouth 2 (two) times daily. 60 tablet 11  . Multiple Vitamins-Minerals (MULTIVITAMIN WITH MINERALS) tablet Take 1 tablet by mouth daily.    . nabumetone (RELAFEN) 750 MG tablet Take 750 mg by mouth 2 (two) times daily.     . Omega-3 Fatty Acids (FISH OIL) 1000 MG CAPS Take 1 capsule by mouth 3 (three) times daily.      . Oxycodone HCl 10 MG TABS Take 10 mg by mouth 3 (three) times daily as needed (for pain).     . potassium chloride SA (K-DUR,KLOR-CON) 20 MEQ tablet Take 1 tablet (20 mEq total) by mouth daily. 30 tablet 5  . spironolactone (ALDACTONE) 25 MG tablet Take 1 tablet (25 mg total) by mouth daily. 30 tablet 5  . triamterene-hydrochlorothiazide (MAXZIDE) 75-50 MG per tablet Take 1 tablet by mouth daily.    . VOLTAREN 1 % GEL Apply 2 g topically 4 (four) times daily as needed (for pain).     Marland Kitchen  zolpidem (AMBIEN) 10 MG tablet Take 10 mg by mouth at bedtime as needed.       No current facility-administered medications for this visit.    No Known Allergies  Review of Systems negative except from HPI and PMH  Physical Exam BP 136/87 mmHg  Pulse 66  Ht 5\' 4"  (1.626 m)  Wt 295 lb 9.6 oz (134.083 kg)  BMI 50.71 kg/m2 Well developed and well nourished in no acute distress HENT normal E scleral and icterus clear Neck Supple JVP flat; carotids brisk and full Clear to ausculation  Regular rate and rhythm, no murmurs gallops or rub Soft with active bowel sounds No clubbing cyanosis none Edema Alert and oriented, grossly normal motor and sensory function Skin Warm and Dry  ECG February 2015  demonstrates sinus rhythm at 59 Intervals 04/11/47 with a QTC of 475  Assessment and  Plan  Long QT syndrome  Hypertension  Pulmonary embolism  Morbidly obese   We discussed at some length strategies for weight loss and its importance. I suspect it is affecting her breathing.  She is intercurrently been diagnosed with sleep apnea. She is wearing her mask. Her orthostasis and blood pressure are much improved   She need reassessment of K and Cr given her high-risk medications  We also want to exclude triamterene hydrochlorthiazide from her regime.

## 2014-08-05 NOTE — Patient Instructions (Addendum)
Your physician recommends that you continue on your current medications as directed. Please refer to the Current Medication list given to you today.  Lab today: BMET  Your physician wants you to follow-up in: 1 year with Dr. Caryl Comes.  You will receive a reminder letter in the mail two months in advance. If you don't receive a letter, please call our office to schedule the follow-up appointment.

## 2014-08-06 LAB — BASIC METABOLIC PANEL
BUN: 14 mg/dL (ref 6–23)
CO2: 29 meq/L (ref 19–32)
CREATININE: 1.11 mg/dL (ref 0.40–1.20)
Calcium: 9.5 mg/dL (ref 8.4–10.5)
Chloride: 100 mEq/L (ref 96–112)
GFR: 67.3 mL/min (ref >60.00)
Glucose, Bld: 95 mg/dL (ref 70–99)
Potassium: 3.9 mEq/L (ref 3.5–5.1)
Sodium: 137 mEq/L (ref 135–145)

## 2014-08-07 ENCOUNTER — Other Ambulatory Visit: Payer: Self-pay

## 2014-08-07 ENCOUNTER — Telehealth: Payer: Self-pay | Admitting: *Deleted

## 2014-08-07 MED ORDER — LABETALOL HCL 200 MG PO TABS: 200.0000 mg | ORAL_TABLET | Freq: Two times a day (BID) | ORAL | Status: DC

## 2014-08-07 NOTE — Telephone Encounter (Signed)
Informed patient to stop Maxzide. She was concerned because last time she stopped this medication she started to swell. Patient is willing to stop it and see how things go. She will call us back if swelling begins again.

## 2014-08-07 NOTE — Telephone Encounter (Signed)
Sonia Sprang, MD   Sent: Tue August 05, 2014 6:04 PM    To: Stanton Kidney, RN        Message     [name] Can u call her and have her stop her triamterene HCTZ i have left message on her phone to call and confirm with you         thx steve

## 2014-08-22 NOTE — Op Note (Signed)
PATIENT NAME:  [name], [name] MR#:  [id number] DATE OF BIRTH:  1965/07/24  DATE OF PROCEDURE:  08/27/2012  PREOPERATIVE DIAGNOSES: 1.  Menorrhagia.  2.  Uterine fibroids.  3.  Endometrial polyps.   POSTOPERATIVE DIAGNOSES:  1.  Menorrhagia.  2.  Uterine fibroids.  3.  Endometrial polyps.   PROCEDURES PERFORMED:   1.  Hysteroscopy.  2.  Dilation and curettage of endometrium.   SURGEON: Alanda Slim. Dillard Pascal, M.D.   FIRST ASSISTANT: Herbert Moors, FNP; Hinton Lovely, PA-S.   ANESTHESIA: General.   INDICATIONS: The patient is a 49 year old African American female, para 1-0-0-1, with history of menorrhagia, who presents for surgical evaluation. Preoperative ultrasound demonstrated multiple endometrial polyps and uterine fibroids.   FINDINGS AT SURGERY: Included a uterus which was midplane and top normal size. The pelvis was gynecoid. There was moderate uterine descensus with cervix tenaculum traction. The uterus sounded to 10 cm. Hysteroscopy revealed several endometrial polyps and shaggy endometrium.   DESCRIPTION OF PROCEDURE: The patient was brought to the operating room where she was placed in the supine position. General anesthesia was induced without difficulty. She was placed in the dorsal lithotomy position using the candy-cane stirrups. A Betadine and perineal intravaginal prep and drape was performed in the standard fashion. A red Robinson catheter was used to drain 100 mL of urine from the bladder. A weighted speculum would not stay within the vagina and therefore a modified Graves speculum was used to facilitate cervical exposure. Cervix was grasped with a single-tooth tenaculum. The uterus was sounded to 10 cm.  The Hanks dilators were used up to a #20-French caliber to dilate the endocervical canal. The [facility] hysteroscope with lactated Ringer's as irrigant was used to identify the intrauterine findings. These were photo documented. Stone polyp forceps was then used to extract  the endometrial polyps. Smooth and serrated curettes were then used to curettage the endometrial cavity. A moderate amount of tissue was obtained. Repeat hysteroscopy revealed reasonable sampling of the endometrial cavity. Upon completion of the curettage all instrumentation was removed from the vagina. The patient was then awakened, mobilized and taken to the recovery room in satisfactory condition.   ESTIMATED BLOOD LOSS: Less than 25 mL.   COMPLICATIONS: None.   COUNTS: All instruments, needle and sponge counts were verified as correct.   NOTE:  The patient is a potential LAVH candidate if hysterectomy is necessary.  ____________________________ Alanda Slim Johnte Portnoy, MD mad:sb D: 08/27/2012 13:19:38 ET T: 08/27/2012 14:30:56 ET JOB#: [id number]  cc: Hassell Done A. Eufemio Strahm, MD, <Dictator> Alanda Slim Cora Stetson MD ELECTRONICALLY SIGNED 08/30/2012 8:57

## 2014-08-23 NOTE — H&P (Signed)
PATIENT NAME:  [name], [name] MR#:  [id number] DATE OF BIRTH:  1965-07-18  DATE OF ADMISSION:  10/09/2013  REFERRING PHYSICIAN: Jasmine December.   PRIMARY CARE PHYSICIAN: Army Melia.   CHIEF COMPLAINT: Leg pain.   HISTORY OF PRESENT ILLNESS: A 49 year old African American female with past medical history of obstructive sleep apnea, hypertension, presenting with leg pain. She describes a mechanical fall which occurred about 1-1/2 weeks ago with associated left leg pain which has been progressively worsening. She describes it currently as 7 to 8 out of 10 in intensity, sharp and nonradiating. Worse with movements and direct palpation. No relieving factors. Has associated edema and erythema. She had 1 episode of chest pain which occurred prior to arrival to the hospital, left-sided, sharp, lasting a few seconds only without associated shortness of breath or further symptomatology. She went to her PCP who performed an ultrasound. Found to have a DVT. Sent to the hospital for further workup and evaluation. Noted to have a PE on CAT scan. Complaining only of leg pain which has significantly improved since arrival.   REVIEW OF SYSTEMS:  CONSTITUTIONAL: Denies fevers, fatigue, weakness.  EYES: Denies blurred vision, double vision, eye pain.  EARS, NOSE, THROAT: Denies tinnitus, ear pain, hearing loss.  RESPIRATORY: Denies cough, wheeze or shortness of breath.  CARDIOVASCULAR: Positive for chest pain as described above. Denies palpitations. Positive for edema as described above.  GASTROINTESTINAL: Denies nausea, vomiting, diarrhea, abdominal pain.  GENITOURINARY: Denies dysuria or hematuria.  ENDOCRINE: Denies nocturia or thyroid problems.  HEMATOLOGIC AND LYMPHATIC: Positive for bruising of the left lower extremity. Denies any bleeding.  SKIN: Denies rashes or lesions.  MUSCULOSKELETAL: Positive for pain in her leg as described above. Denies any back, shoulder, knee or hip pain.  NEUROLOGIC: Denies  paralysis, paresthesias.  PSYCHIATRIC: Denies anxiety or depressive symptoms.    Otherwise, full review of systems performed by me is negative.   PAST MEDICAL HISTORY: Obstructive sleep apnea compliant with CPAP therapy, gastroesophageal reflux disease, depression and anxiety not otherwise specified, hypertension, history of thrombophlebitis.   SOCIAL HISTORY: Denies any tobacco use. Positive for occasional alcohol use.   FAMILY HISTORY: Positive for diabetes. Denies any known clotting disorders or bleeding disorders.   ALLERGIES: No known drug allergies.   HOME MEDICATIONS: Include propranolol 80 mg p.o. daily, Lexapro 10 mg p.o. daily, hydrochlorothiazide/triamterene 50/75 mg p.o. daily, Ambien as needed for sleep, Amitiza 8 mcg p.o. b.i.d., Nexium 40 mg p.o. daily.   PHYSICAL EXAMINATION:  VITAL SIGNS: Temperature 98.1, heart rate 82, respirations 20, blood pressure 112/51, saturating 96% on room air. Weight 130.6 kg, BMI 49.5.  GENERAL: Well-nourished, well-developed, African American female, currently in no acute distress.  HEAD: Normocephalic, atraumatic.  EYES: Pupils equal, round and reactive to light. Extraocular muscles intact. No scleral icterus.  MOUTH: Moist mucosal membranes. Dentition intact. No abscess noted.  EARS, NOSE, THROAT: Clear without exudates. No external lesions.  NECK: Supple. No thyromegaly. No nodules. No JVD.  PULMONARY: Clear to auscultation bilaterally without wheezes, rubs or rhonchi. No use of accessory muscles. Good respiratory effort.  CHEST: Nontender to palpation.  CARDIOVASCULAR: S1, S2, regular rate and rhythm. No murmurs, rubs or gallops. No edema. Pedal pulses 2+ bilaterally.  GASTROINTESTINAL: Soft, nontender, nondistended. No masses. Positive bowel sounds. No hepatosplenomegaly.  MUSCULOSKELETAL: Mild left lower extremity edema; however, there is erythema on the posterior and medial aspect of the left calf which provokes tenderness to palpation.  Range of motion full in all extremities. No further swelling,  clubbing or edema.  NEUROLOGIC: Cranial nerves II through XII intact. No gross focal neurological deficits. Sensation intact. Reflexes intact.  SKIN: No ulcerations, lesions, rashes or cyanosis. Skin warm, dry. Turgor intact.  PSYCHIATRIC: Mood and affect within normal limits. The patient is awake, alert, oriented x 3. Insight and judgment intact.   LABORATORY DATA: CT chest to rule out a PE performed, revealing bilateral pulmonary emboli. Ultrasound of her left leg reveals thrombus in the popliteal vein and greater saphenous vein. Remainder of laboratory data: Sodium 139, potassium 3.3, chloride 106, bicarb 27, BUN 16, creatinine 1.27, glucose 136. LFTs within normal limits. WBC 8.5, hemoglobin 12.6, platelets of 212.   ASSESSMENT AND PLAN: A 49 year old [place] American female with history of sleep apnea, hypertension, presenting with leg pain, found to have deep vein thrombosis as well as pulmonary embolism.  1. Provoked pulmonary embolism and deep vein thrombosis: Provide pain control. Initiate heparin drip with bolus for anticoagulation. Risk factors: States that she has been poorly mobile over the past 1-1/2 weeks after initial fall, had back surgery back in February, obesity. No further risk factors; however, that is enough to deem this as provoked deep venous thrombosis/pulmonary embolism. She will need 6 months of anticoagulation.  2. Hypokalemia: Replace potassium to goal of 4 to 5.  3. Obstructive sleep apnea: Continue with home CPAP.  4. Gastroesophageal reflux disease: Continue proton pump inhibitor therapy.  5. Venous thromboembolism prophylaxis with heparin drip.   The patient is FULL CODE.   TIME SPENT: 45 minutes.   ____________________________ Aaron Mose. Hower, MD dkh:gb D: 10/09/2013 01:39:01 ET T: 10/09/2013 03:33:29 ET JOB#: [id number]  cc: Aaron Mose. Hower, MD, <Dictator> DAVID Woodfin Ganja MD ELECTRONICALLY SIGNED  10/10/2013 8:18

## 2014-08-23 NOTE — Discharge Summary (Signed)
Dates of Admission and Diagnosis:  Date of Admission 09-Oct-2013   Date of Discharge 10-Oct-2013   Admitting Diagnosis PE   Final Diagnosis DVT and PE OSA Obasity Hypokalemia    Chief Complaint/History of Present Illness A 49 year old African American [name] with past medical history of obstructive sleep apnea, hypertension, presenting with leg pain. She describes a mechanical fall which occurred about 1-1/2 weeks ago with associated left leg pain which has been progressively worsening. She describes it currently as 7 to 8 out of 10 in intensity, sharp and nonradiating. Worse with movements and direct palpation. No relieving factors. Has associated edema and erythema. She had 1 episode of chest pain which occurred prior to arrival to the hospital, left-sided, sharp, lasting a few seconds only without associated shortness of breath or further symptomatology. She went to her PCP who performed an ultrasound. Found to have a DVT. Sent to the hospital for further workup and evaluation. Noted to have a PE on CAT scan. Complaining only of leg pain which has significantly improved since arrival.   Allergies:  No Known Allergies:   Hepatic:  09-Jun-15 23:41   Bilirubin, Total 0.6  Alkaline Phosphatase 93 (45-117 NOTE: New Reference Range 03/22/13)  SGPT (ALT) 22  SGOT (AST) 22  Total Protein, Serum 7.6  Albumin, Serum 3.8  Routine Chem:  09-Jun-15 23:41   Glucose, Serum  136  BUN 16  Creatinine (comp) 1.27  Sodium, Serum 139  Potassium, Serum  3.3  Chloride, Serum 106  CO2, Serum 27  Calcium (Total), Serum 9.2  Osmolality (calc) 281  eGFR (African American)  58  eGFR (Non-African American)  50 (eGFR values <20m/min/1.73 m2 may be an indication of chronic kidney disease (CKD). Calculated eGFR is useful in patients with stable renal function. The eGFR calculation will not be reliable in acutely ill patients when serum creatinine is changing rapidly. It is not useful in  patients  on dialysis. The eGFR calculation may not be applicable to patients at the low and high extremes of body sizes, pregnant women, and vegetarians.)  Anion Gap  6  Routine Coag:  09-Jun-15 23:41   Activated PTT (APTT)  23.2 (A HCT value >55% may artifactually increase the APTT. In one study, the increase was an average of 19%. Reference: "Effect on Routine and Special Coagulation Testing Values of Citrate Anticoagulant Adjustment in Patients with High HCT Values." American Journal of Clinical Pathology 2006;126:400-405.)  Prothrombin 13.2  INR 1.0 (INR reference interval applies to patients on anticoagulant therapy. A single INR therapeutic range for coumarins is not optimal for all indications; however, the suggested range for most indications is 2.0 - 3.0. Exceptions to the INR Reference Range may include: Prosthetic heart valves, acute myocardial infarction, prevention of myocardial infarction, and combinations of aspirin and anticoagulant. The need for a higher or lower target INR must be assessed individually. Reference: The Pharmacology and Management of the Vitamin K  antagonists: the seventh ACCP Conference on Antithrombotic and Thrombolytic Therapy. CGHWEX.9371Sept:126 (3suppl): [id number] A HCT value >55% may artifactually increase the PT.  In one study,  the increase was an average of 25%. Reference:  "Effect on Routine and Special Coagulation Testing Values of Citrate Anticoagulant Adjustment in Patients with High HCT Values." American Journal of Clinical Pathology 2006;126:400-405.)  Routine Hem:  09-Jun-15 23:41   Platelet Count (CBC) 212  Hemoglobin (CBC) 12.6  WBC (CBC) 8.5  RBC (CBC) 4.56  Hematocrit (CBC) 39.3  MCV 86  MCH 27.6  MCHC 32.0  RDW  15.2  Neutrophil % 60.2  Lymphocyte % 31.6  Monocyte % 5.1  Eosinophil % 1.7  Basophil % 1.4  Neutrophil # 5.1  Lymphocyte # 2.7  Monocyte # 0.4  Eosinophil # 0.1  Basophil # 0.1 (Result(s) reported on 09 Oct 2013  at 12:04AM.)   PERTINENT RADIOLOGY STUDIES: [place]:    09-Jun-15 20:37, [place] Color Flow Doppler Low Extrem Left (Leg)  [place] Color Flow Doppler Low Extrem Left (Leg)   REASON FOR EXAM:    Pain and swelling left calf post fall  COMMENTS:       PROCEDURE: [place]  - US DOPPLER LOW EXTR LEFT  - Oct 08 2013  8:37PM     CLINICAL DATA:  Left leg pain and swelling. Recent fall. Prior  history of DVT.    EXAM:  Left LOWER EXTREMITY VENOUS DOPPLER ULTRASOUND    TECHNIQUE:  Gray-scale sonography with graded compression, as well as color  Doppler and duplex ultrasound were performed to evaluate the lower  extremity deep venous systems from the level of the common femoral  veinand including the common femoral, femoral, profunda femoral,  popliteal and calf veins including the posterior tibial, peroneal  and gastrocnemius veins when visible. The superficial great  saphenous vein was also interrogated. Spectral Doppler was utilized  to evaluate flow at rest and with distal augmentation maneuvers in  the common femoral, femoral and popliteal veins.    COMPARISON:  None.    FINDINGS:  Common Femoral Vein: No evidence of thrombus. Normal  compressibility, respiratory phasicity and response to augmentation.    Saphenofemoral Junction: No evidence of thrombus. Normal  compressibility and flow on color Doppler imaging.  Profunda Femoral Vein: No evidence of thrombus. Normal  compressibility and flow on color Doppler imaging.    Femoral Vein: No evidence of thrombus. Normal compressibility,  respiratory phasicity and response to augmentation.    Popliteal Vein: Nonocclusive thrombus is noted in the popliteal  vein. No compressibility.    Superficial Great Saphenous Vein: Thrombus is noted throughout the  greater saphenous vein, mid thigh through the calf.    Venous Reflux:  None.    Other Findings:  Lesser saphenous vein not visualized.   IMPRESSION:  Thrombus in the popliteal vein and greater  saphenous vein. Findings  consistent with deep venous thrombosis.      Electronically Signed    By: Rolla Flatten M.D.    On: 10/08/2013 21:12         Verified By: Staci Righter, M.D.,  LabUnknown:  PACS Image     10-Jun-15 00:35, CT [facility] Chest with for PE  PACS Image    Pertinent Past History:  Pertinent Past History Obstructive sleep apnea compliant with CPAP therapy, gastroesophageal reflux disease, depression and anxiety not otherwise specified, hypertension, history of thrombophlebitis.   Hospital Course:  Hospital Course 1. Provoked pulmonary embolism and deep vein thrombosis: continue pain control.initially started and later stopped heparin drip as she is agreeable to start eliquis and insurance approval is obtained. As per CM- her pharmacy have eliquis in stock - so discharged her.  2. Hypokalemia: Replaced potassium .   3. Obstructive sleep apnea: Continue with home CPAP.   4. Gastroesophageal reflux disease: Continue proton pump inhibitor therapy.   Condition on Discharge Stable   Code Status:  Code Status Full Code   DISCHARGE INSTRUCTIONS HOME MEDS:  Medication Reconciliation: Patient's Home Medications at Discharge:     Medication Instructions  hydrochlorothiazide-triamterene 50  mg-75 mg oral tablet  1 tab(s) orally once a day (at bedtime)   nabumetone 750 mg oral tablet  1 tab(s) orally 2 times a day. stopped 1 week ago.   propranolol 80 mg oral tablet  1 tab(s) orally once a day (in the morning)   nexium 40 mg oral delayed release capsule  1 cap(s) orally once a day   amitiza 8 mcg oral capsule  1 cap(s) orally 2 times a day   lexapro 10 mg oral tablet  1 tab(s) orally once a day   ambien   orally    iron     cpap at night     apixaban 5 mg oral tablet  2 tab(s) orally 2 times a day x 6 days   eliquis 5 mg oral tablet  1 tab(s) orally 2 times a day- start after 6 days     Physician's Instructions:  Diet Low Sodium  Low Fat, Low Cholesterol    Activity Limitations As tolerated   Return to Work Not Applicable   Time frame for Follow Up Appointment 1-2 weeks  PMD   Other Comments follow with PMD in 2 weeks.   Electronic Signatures: Vaughan Basta (MD)  (Signed 14-Jun-15 14:01)  Authored: ADMISSION DATE AND DIAGNOSIS, CHIEF COMPLAINT/HPI, Allergies, PERTINENT LABS, PERTINENT RADIOLOGY STUDIES, PERTINENT PAST HISTORY, HOSPITAL COURSE, DISCHARGE INSTRUCTIONS HOME MEDS, PATIENT INSTRUCTIONS   Last Updated: 14-Jun-15 14:01 by Vaughan Basta (MD)

## 2014-10-05 ENCOUNTER — Encounter: Payer: Self-pay | Admitting: Internal Medicine

## 2014-10-05 ENCOUNTER — Other Ambulatory Visit: Payer: Self-pay | Admitting: Internal Medicine

## 2014-10-05 DIAGNOSIS — K2100 Gastro-esophageal reflux disease with esophagitis, without bleeding: Secondary | ICD-10-CM | POA: Insufficient documentation

## 2014-10-05 DIAGNOSIS — H9190 Unspecified hearing loss, unspecified ear: Secondary | ICD-10-CM | POA: Insufficient documentation

## 2014-10-05 DIAGNOSIS — R600 Localized edema: Secondary | ICD-10-CM | POA: Insufficient documentation

## 2014-10-05 DIAGNOSIS — F5101 Primary insomnia: Secondary | ICD-10-CM | POA: Insufficient documentation

## 2014-10-05 DIAGNOSIS — I82409 Acute embolism and thrombosis of unspecified deep veins of unspecified lower extremity: Secondary | ICD-10-CM | POA: Insufficient documentation

## 2014-10-05 DIAGNOSIS — F324 Major depressive disorder, single episode, in partial remission: Secondary | ICD-10-CM | POA: Insufficient documentation

## 2014-10-05 DIAGNOSIS — M169 Osteoarthritis of hip, unspecified: Secondary | ICD-10-CM | POA: Insufficient documentation

## 2014-10-05 DIAGNOSIS — Q6650 Congenital pes planus, unspecified foot: Secondary | ICD-10-CM | POA: Insufficient documentation

## 2014-10-05 DIAGNOSIS — I2699 Other pulmonary embolism without acute cor pulmonale: Secondary | ICD-10-CM | POA: Insufficient documentation

## 2014-10-31 ENCOUNTER — Other Ambulatory Visit: Payer: Self-pay | Admitting: Internal Medicine

## 2014-12-18 ENCOUNTER — Other Ambulatory Visit: Payer: Self-pay | Admitting: Nurse Practitioner

## 2014-12-18 DIAGNOSIS — D509 Iron deficiency anemia, unspecified: Secondary | ICD-10-CM | POA: Diagnosis not present

## 2014-12-18 DIAGNOSIS — Z0000 Encounter for general adult medical examination without abnormal findings: Secondary | ICD-10-CM | POA: Diagnosis not present

## 2014-12-18 DIAGNOSIS — N393 Stress incontinence (female) (male): Secondary | ICD-10-CM | POA: Diagnosis not present

## 2014-12-18 DIAGNOSIS — F3340 Major depressive disorder, recurrent, in remission, unspecified: Secondary | ICD-10-CM | POA: Diagnosis not present

## 2014-12-18 DIAGNOSIS — Z1231 Encounter for screening mammogram for malignant neoplasm of breast: Secondary | ICD-10-CM

## 2014-12-18 DIAGNOSIS — I10 Essential (primary) hypertension: Secondary | ICD-10-CM | POA: Diagnosis not present

## 2014-12-18 DIAGNOSIS — K219 Gastro-esophageal reflux disease without esophagitis: Secondary | ICD-10-CM | POA: Diagnosis not present

## 2014-12-18 DIAGNOSIS — R109 Unspecified abdominal pain: Secondary | ICD-10-CM | POA: Diagnosis not present

## 2014-12-18 DIAGNOSIS — Z1389 Encounter for screening for other disorder: Secondary | ICD-10-CM | POA: Diagnosis not present

## 2014-12-18 DIAGNOSIS — G4700 Insomnia, unspecified: Secondary | ICD-10-CM | POA: Diagnosis not present

## 2014-12-19 DIAGNOSIS — D509 Iron deficiency anemia, unspecified: Secondary | ICD-10-CM | POA: Diagnosis not present

## 2014-12-19 DIAGNOSIS — Z131 Encounter for screening for diabetes mellitus: Secondary | ICD-10-CM | POA: Diagnosis not present

## 2014-12-19 DIAGNOSIS — I519 Heart disease, unspecified: Secondary | ICD-10-CM | POA: Diagnosis not present

## 2014-12-25 ENCOUNTER — Ambulatory Visit: Payer: Medicare Other

## 2015-02-02 DIAGNOSIS — Z6841 Body Mass Index (BMI) 40.0 and over, adult: Secondary | ICD-10-CM | POA: Diagnosis not present

## 2015-02-02 DIAGNOSIS — M5415 Radiculopathy, thoracolumbar region: Secondary | ICD-10-CM | POA: Diagnosis not present

## 2015-03-09 DIAGNOSIS — Z6841 Body Mass Index (BMI) 40.0 and over, adult: Secondary | ICD-10-CM | POA: Diagnosis not present

## 2015-03-09 DIAGNOSIS — M25531 Pain in right wrist: Secondary | ICD-10-CM | POA: Diagnosis not present

## 2015-03-09 DIAGNOSIS — I10 Essential (primary) hypertension: Secondary | ICD-10-CM | POA: Diagnosis not present

## 2015-03-20 ENCOUNTER — Ambulatory Visit (INDEPENDENT_AMBULATORY_CARE_PROVIDER_SITE_OTHER): Payer: Medicare Other | Admitting: Podiatry

## 2015-03-20 ENCOUNTER — Telehealth: Payer: Self-pay | Admitting: *Deleted

## 2015-03-20 ENCOUNTER — Encounter: Payer: Self-pay | Admitting: Podiatry

## 2015-03-20 DIAGNOSIS — M79676 Pain in unspecified toe(s): Secondary | ICD-10-CM

## 2015-03-20 DIAGNOSIS — B351 Tinea unguium: Secondary | ICD-10-CM | POA: Diagnosis not present

## 2015-03-20 DIAGNOSIS — R0989 Other specified symptoms and signs involving the circulatory and respiratory systems: Secondary | ICD-10-CM

## 2015-03-20 DIAGNOSIS — L84 Corns and callosities: Secondary | ICD-10-CM | POA: Diagnosis not present

## 2015-03-20 NOTE — Telephone Encounter (Signed)
Orders faxed to [facility]. 

## 2015-03-24 NOTE — Progress Notes (Signed)
Patient ID: [name], female   DOB: [birth year], 49 y.o.   MRN: [id number]  Subjective: 49 year old female presents the office today with thick, painful, elongated fifth digit toenails. She is asking for the toenails be removed at this time. She is present in the other nails removed without any complications however his been over 1.5 years ago. She has a states that she has a callus in her left big toe which is painful but he was shoe gear and pressure. Denies a redness or drainage on the toenail site or around the callus. She tries to chart toenails however she is to do this due to the thickness. No other complaints at this time.  Objective: AAO 3, NAD DP pulses 2/4, PT pulses 1/4, CRT < 3 sec Protective sensation intact with Simms Weinstein monofilament Fifth digit toenails are hypertrophic, dystrophic, brittle, discolored, elongated. There is tenderness to palpation over the nails. There is no surrounding erythema or drainage. Remaining toenails previously been removed. There is a hyperkeratotic lesion left foot on the medial hallux. Upon debridement no underlying ulceration, drainage or other signs of infection. No other open lesions or pre-ulcerative lesions identified bilaterally. There is no pain with calf compression, swelling, warmth, erythema.  Assessment: 49 year old female bilateral fifth digit onychodystrophy/onychomycosis and hyperkeratotic lesion left medial hallux  Plan: -Treatment options discussed including all alternatives, risks, and complications -At today's appointment she is requesting fifth digit toenail removal to both of her little toes. However on clinical exam pulses on the posterior tibial pulse appear to be decrease which is a change compared to her last appointment with Dr. Blenda Mounts. Because of this I will order arterial study for prior to any invasive procedure. The nails are debrided without, complications last bleeding. Hyperkeratotic lesion was also debrided  without, complications last bleeding. Follow-up after vascular study or sooner if any problems are to arise.  Celesta Gentile, DPM

## 2015-04-20 DIAGNOSIS — I10 Essential (primary) hypertension: Secondary | ICD-10-CM | POA: Diagnosis not present

## 2015-04-20 DIAGNOSIS — M25531 Pain in right wrist: Secondary | ICD-10-CM | POA: Diagnosis not present

## 2015-04-20 DIAGNOSIS — Z1211 Encounter for screening for malignant neoplasm of colon: Secondary | ICD-10-CM | POA: Diagnosis not present

## 2015-04-20 DIAGNOSIS — E661 Drug-induced obesity: Secondary | ICD-10-CM | POA: Diagnosis not present

## 2015-04-20 DIAGNOSIS — Z6841 Body Mass Index (BMI) 40.0 and over, adult: Secondary | ICD-10-CM | POA: Diagnosis not present

## 2015-04-23 ENCOUNTER — Encounter: Payer: Self-pay | Admitting: Physical Medicine & Rehabilitation

## 2015-04-30 ENCOUNTER — Encounter: Payer: Medicare Other | Attending: Physical Medicine & Rehabilitation | Admitting: Physical Medicine & Rehabilitation

## 2015-04-30 ENCOUNTER — Encounter: Payer: Self-pay | Admitting: Physical Medicine & Rehabilitation

## 2015-04-30 VITALS — BP 130/78 | HR 67 | Ht 65.0 in | Wt 276.0 lb

## 2015-04-30 DIAGNOSIS — R0789 Other chest pain: Secondary | ICD-10-CM | POA: Insufficient documentation

## 2015-04-30 DIAGNOSIS — G729 Myopathy, unspecified: Secondary | ICD-10-CM

## 2015-04-30 DIAGNOSIS — F419 Anxiety disorder, unspecified: Secondary | ICD-10-CM | POA: Diagnosis not present

## 2015-04-30 DIAGNOSIS — M6289 Other specified disorders of muscle: Secondary | ICD-10-CM

## 2015-04-30 DIAGNOSIS — Z87891 Personal history of nicotine dependence: Secondary | ICD-10-CM | POA: Diagnosis not present

## 2015-04-30 DIAGNOSIS — Z79899 Other long term (current) drug therapy: Secondary | ICD-10-CM | POA: Diagnosis not present

## 2015-04-30 DIAGNOSIS — Z86711 Personal history of pulmonary embolism: Secondary | ICD-10-CM | POA: Diagnosis not present

## 2015-04-30 DIAGNOSIS — I10 Essential (primary) hypertension: Secondary | ICD-10-CM | POA: Insufficient documentation

## 2015-04-30 DIAGNOSIS — Z9181 History of falling: Secondary | ICD-10-CM | POA: Diagnosis not present

## 2015-04-30 DIAGNOSIS — Z8249 Family history of ischemic heart disease and other diseases of the circulatory system: Secondary | ICD-10-CM | POA: Diagnosis not present

## 2015-04-30 DIAGNOSIS — M792 Neuralgia and neuritis, unspecified: Secondary | ICD-10-CM | POA: Diagnosis not present

## 2015-04-30 DIAGNOSIS — Z7901 Long term (current) use of anticoagulants: Secondary | ICD-10-CM | POA: Insufficient documentation

## 2015-04-30 DIAGNOSIS — R42 Dizziness and giddiness: Secondary | ICD-10-CM | POA: Diagnosis not present

## 2015-04-30 DIAGNOSIS — Z7982 Long term (current) use of aspirin: Secondary | ICD-10-CM | POA: Insufficient documentation

## 2015-04-30 DIAGNOSIS — K219 Gastro-esophageal reflux disease without esophagitis: Secondary | ICD-10-CM | POA: Diagnosis not present

## 2015-04-30 DIAGNOSIS — G4700 Insomnia, unspecified: Secondary | ICD-10-CM

## 2015-04-30 DIAGNOSIS — G4733 Obstructive sleep apnea (adult) (pediatric): Secondary | ICD-10-CM | POA: Diagnosis not present

## 2015-04-30 DIAGNOSIS — I739 Peripheral vascular disease, unspecified: Secondary | ICD-10-CM | POA: Insufficient documentation

## 2015-04-30 DIAGNOSIS — G894 Chronic pain syndrome: Secondary | ICD-10-CM

## 2015-04-30 DIAGNOSIS — W19XXXA Unspecified fall, initial encounter: Secondary | ICD-10-CM

## 2015-04-30 DIAGNOSIS — F329 Major depressive disorder, single episode, unspecified: Secondary | ICD-10-CM | POA: Diagnosis not present

## 2015-04-30 DIAGNOSIS — Z6841 Body Mass Index (BMI) 40.0 and over, adult: Secondary | ICD-10-CM | POA: Diagnosis not present

## 2015-04-30 MED ORDER — METHOCARBAMOL 500 MG PO TABS: 500.0000 mg | ORAL_TABLET | Freq: Three times a day (TID) | ORAL | Status: DC | PRN

## 2015-04-30 MED ORDER — AMITRIPTYLINE HCL 150 MG PO TABS: 150.0000 mg | ORAL_TABLET | Freq: Every day | ORAL | Status: DC

## 2015-04-30 NOTE — Addendum Note (Signed)
Addended by: Delice Lesch A on: 04/30/2015 10:39 AM   Modules accepted: Orders, Medications

## 2015-04-30 NOTE — Progress Notes (Addendum)
Subjective:    Patient ID: [name], female    DOB: Feb 27, 1966, 49 y.o.   MRN: [id number]  HPI  49 y/o female with pmh HTN, depression, anxiety, seizures presents with medication management for pain.  Pt had lumbar surgery in 2009 and neck surgery in 2014.  Since that time the pt has been having in pain in her left neck (near the incision site).  She has associated cramping.   ROM of left should exacerbates the pain. Laying in pt's right side improves the pain.  The pain is sharp.  It is constant.  It occasionally radiates down her dorsal arm.  Severity is 8/10 today.  Pt is on Roxi and diazepam.   The pt is on disability.    Pain Inventory Average Pain 8 Pain Right Now 8 My pain is sharp, burning and aching  In the last 24 hours, has pain interfered with the following? General activity 10 Relation with others 10 Enjoyment of life 7 What TIME of day is your pain at its worst? morning, evening and night Sleep (in general) Poor  Pain is worse with: walking, bending, sitting, standing and some activites Pain improves with: therapy/exercise Relief from Meds: 10  Mobility walk with assistance use a cane how many minutes can you walk? 20 ability to climb steps?  yes do you drive?  yes Do you have any goals in this area?  yes  Function I need assistance with the following:  dressing, bathing, toileting, household duties and shopping Do you have any goals in this area?  yes  Neuro/Psych numbness trouble walking spasms dizziness depression anxiety  Prior Studies Any changes since last visit?  no  Physicians involved in your care Any changes since last visit?  no   Family History  Problem Relation Age of Onset  . Long QT syndrome Mother   . Diabetes Father   . Diabetes Brother   . CAD Father   . CAD Mother    Social History   Social History  . Marital Status: Single    Spouse Name: N/A  . Number of Children: N/A  . Years of Education: N/A    Occupational History  . disabled    Social History Main Topics  . Smoking status: Former Smoker    Quit date: 08/16/2001  . Smokeless tobacco: Never Used  . Alcohol Use: 1.2 oz/week    2 Standard drinks or equivalent per week  . Drug Use: No  . Sexual Activity: No   Other Topics Concern  . None   Social History Narrative   Past Surgical History  Procedure Laterality Date  . Total hip arthroplasty    . Lumbar disc surgery    . Left foot surgery    . Left hand surgery x 2    . Right hand surgery for torn ligament    . Colonoscopy    . Cardiac catheterization    . Dilation and curettage of uterus    . Anterior cervical decomp/discectomy fusion N/A 06/25/2013    Procedure: ANTERIOR CERVICAL DECOMPRESSION/DISCECTOMY FUSION 2 LEVELS LEVELS C FOUR/FIVE, SIX/SEVEN;  Surgeon: Floyce Stakes, MD;  Location: [facility];  Service: Neurosurgery;  Laterality: N/A;   Past Medical History  Diagnosis Date  . Prolonged QT interval     gene positive  . HTN (hypertension)   . Atypical chest pain     negative Myoview in 2011 with no ischemi and normal LV function.   . Obesity   .  OSA (obstructive sleep apnea)     uses C-PAP  . Anxiety   . Depression   . Peripheral vascular disease ([place])     blood clot in right leg  . GERD (gastroesophageal reflux disease)     uses Nexium  . Seizures ([place])     hasn't had any for 4 years  . Arthritis     osteoarthritis  . Anemia   . DVT (deep venous thrombosis) ([place])   . PE (pulmonary embolism)    BP 130/78 mmHg  Pulse 67  Ht 5\' 5"  (1.651 m)  Wt 276 lb (125.193 kg)  BMI 45.93 kg/m2  SpO2 96%  LMP 04/30/2015  Opioid Risk Score:   Fall Risk Score:  `1  Depression screen PHQ 2/9  Depression screen PHQ 2/9 04/30/2015  Decreased Interest 1  Down, Depressed, Hopeless 0  PHQ - 2 Score 1   Current Outpatient Prescriptions on File Prior to Visit  Medication Sig Dispense Refill  . albuterol (PROVENTIL HFA;VENTOLIN HFA) 108 (90 BASE)  MCG/ACT inhaler Inhale 1-2 puffs into the lungs every 4 (four) hours as needed for wheezing or shortness of breath.    Marland Kitchen apixaban (ELIQUIS) 5 MG TABS tablet Take 1 tablet by mouth 2 (two) times daily. Reported on 04/30/2015    . aspirin 81 MG tablet Take 81 mg by mouth daily.      . budesonide-formoterol (SYMBICORT) 160-4.5 MCG/ACT inhaler Inhale 2 puffs into the lungs 2 (two) times daily as needed (for shortness of breath).    . escitalopram (LEXAPRO) 20 MG tablet Take 20 mg by mouth daily.     Marland Kitchen esomeprazole (NEXIUM) 40 MG capsule Take 40 mg by mouth daily at 12 noon.    . Ferrous Sulfate (SLOW RELEASE IRON PO) Take 1 tablet by mouth daily.     . fluocinonide (LIDEX) 0.05 % external solution Apply 1 application topically 2 (two) times daily.    . furosemide (LASIX) 20 MG tablet TAKE (1) TABLET BY MOUTH EVERY DAY 30 tablet 5  . Glucosamine-Chondroit-Vit C-Mn (GLUCOSAMINE 1500 COMPLEX) CAPS Take 1 each by mouth daily.    Marland Kitchen HYDROcodone-acetaminophen (NORCO/VICODIN) 5-325 MG tablet     . hyoscyamine (LEVSIN SL) 0.125 MG SL tablet Take 1 tablet by mouth 2 (two) times daily as needed. For nausea    . labetalol (NORMODYNE) 200 MG tablet Take 1 tablet (200 mg total) by mouth 2 (two) times daily. 60 tablet 11  . Multiple Vitamins-Minerals (MULTIVITAMIN WITH MINERALS) tablet Take 1 tablet by mouth daily.    . nabumetone (RELAFEN) 750 MG tablet Take 750 mg by mouth 2 (two) times daily.     . Omega-3 Fatty Acids (FISH OIL) 1000 MG CAPS Take 1 capsule by mouth 3 (three) times daily.      . Oxycodone HCl 10 MG TABS Take 10 mg by mouth 3 (three) times daily as needed (for pain).     . potassium chloride SA (K-DUR,KLOR-CON) 20 MEQ tablet Take 1 tablet (20 mEq total) by mouth daily. 30 tablet 5  . spironolactone (ALDACTONE) 25 MG tablet Take 1 tablet (25 mg total) by mouth daily. 30 tablet 5  . triamterene-hydrochlorothiazide (MAXZIDE) 75-50 MG per tablet Take 1 tablet by mouth daily.    . VOLTAREN 1 % GEL  Apply 2 g topically 4 (four) times daily as needed (for pain).     Marland Kitchen zolpidem (AMBIEN) 10 MG tablet Take 10 mg by mouth at bedtime as needed.      Marland Kitchen  lubiprostone (AMITIZA) 8 MCG capsule Take 1 capsule by mouth daily. Reported on 04/30/2015     No current facility-administered medications on file prior to visit.   Review of Systems  Respiratory: Positive for apnea and wheezing.   Musculoskeletal: Positive for gait problem.       Spasms  Neurological: Positive for numbness.  Psychiatric/Behavioral: The patient is nervous/anxious.        Depression  All other systems reviewed and are negative.     Objective:   Physical Exam  Gen: NAD. Vita signs reviewed. Obese HENT: Normocephalic Eyes: EOMI, Conj WNL Cardio: S1, S2 normal, RRR Pulm: B/l clear to auscultation.  Effort normal Abd: Soft, non-distended, non-tender, BS+ MSK:  Gait Antalgic.   No gross TTP in UE neck, upper back.    No edema.   Neg Spurling's b/l.  Decreased ROM in neck on left lateral deviation secondary to pain. Neuro: CN II-XII grossly intact.    Sensation intact to light touch in all UE dermatomes  Reflexes 2+ throughout  Strength  5/5 in all UE myotomes Skin: Surgical scar left anterior neck     Assessment & Plan:  49 y/o female with pmh HTN, depression, anxiety, seizures presents with medication management for pain.  1. Chronic pain syndrome - Most pronounced in left anterior neck along surgical site  Will refer to PT  Will refer to massage therapy  Will recommend eval for TENS  Will order robaxin q8 PRN  Will order elavil  Will refer for evaluation for breast reduction surgery   2. Muscle stiffness  Will refer to PT  Will refer to massage therapy  Will order robaxin q8 PRN  Will refer for evaluation for breast reduction surgery  3. Neuropathic pain  Will recommend eval for TENS  Will order elavil  4. Insomnia with hx of OSA  Will order elavil  Cont CPA  5. Morbid obesity  Encouraged weight  loss  Referral to PT  6. Falls  Pt has a cane, but is embarrassed to use it  Encouraged use of cane

## 2015-04-30 NOTE — Addendum Note (Signed)
Addended by: Delice Lesch A on: 04/30/2015 10:31 AM   Modules accepted: Orders

## 2015-05-13 ENCOUNTER — Ambulatory Visit
Payer: Medicare Other | Attending: Physical Medicine & Rehabilitation | Admitting: Rehabilitative and Restorative Service Providers"

## 2015-05-13 DIAGNOSIS — M542 Cervicalgia: Secondary | ICD-10-CM | POA: Insufficient documentation

## 2015-05-13 DIAGNOSIS — M545 Low back pain: Secondary | ICD-10-CM | POA: Insufficient documentation

## 2015-05-13 DIAGNOSIS — R2681 Unsteadiness on feet: Secondary | ICD-10-CM | POA: Insufficient documentation

## 2015-05-13 DIAGNOSIS — R29818 Other symptoms and signs involving the nervous system: Secondary | ICD-10-CM | POA: Insufficient documentation

## 2015-05-13 DIAGNOSIS — R531 Weakness: Secondary | ICD-10-CM | POA: Insufficient documentation

## 2015-05-13 DIAGNOSIS — R29898 Other symptoms and signs involving the musculoskeletal system: Secondary | ICD-10-CM | POA: Insufficient documentation

## 2015-05-13 DIAGNOSIS — R6889 Other general symptoms and signs: Secondary | ICD-10-CM | POA: Insufficient documentation

## 2015-05-27 ENCOUNTER — Encounter: Payer: Self-pay | Admitting: Physical Therapy

## 2015-05-27 ENCOUNTER — Ambulatory Visit: Payer: Medicare Other | Admitting: Physical Therapy

## 2015-05-27 DIAGNOSIS — M545 Low back pain, unspecified: Secondary | ICD-10-CM

## 2015-05-27 DIAGNOSIS — R2689 Other abnormalities of gait and mobility: Secondary | ICD-10-CM

## 2015-05-27 DIAGNOSIS — R6889 Other general symptoms and signs: Secondary | ICD-10-CM

## 2015-05-27 DIAGNOSIS — R29818 Other symptoms and signs involving the nervous system: Secondary | ICD-10-CM | POA: Diagnosis not present

## 2015-05-27 DIAGNOSIS — R29898 Other symptoms and signs involving the musculoskeletal system: Secondary | ICD-10-CM

## 2015-05-27 DIAGNOSIS — M542 Cervicalgia: Secondary | ICD-10-CM

## 2015-05-27 DIAGNOSIS — R2681 Unsteadiness on feet: Secondary | ICD-10-CM | POA: Diagnosis not present

## 2015-05-27 DIAGNOSIS — R531 Weakness: Secondary | ICD-10-CM | POA: Diagnosis not present

## 2015-05-27 NOTE — Therapy (Signed)
[facility] [address] [place], [place], [address] Phone: [phone]   Fax:  [phone]  Physical Therapy Evaluation  Patient Details  Name: [name] MRN: [id number] Date of Birth: 05-Oct-1965 Referring Provider: Jamse Arn, MD  Encounter Date: 05/27/2015      PT End of Session - 05/27/15 1015    Visit Number 1   Number of Visits 18   Date for PT Re-Evaluation 07/24/15   Authorization Type Medicare G-code & progress note every 10 visits   PT Start Time 1016   PT Stop Time 1055   PT Time Calculation (min) 39 min   Activity Tolerance Patient limited by pain   Behavior During Therapy [facility] for tasks assessed/performed      Past Medical History  Diagnosis Date  . Prolonged QT interval     gene positive  . HTN (hypertension)   . Atypical chest pain     negative Myoview in 2011 with no ischemi and normal LV function.   . Obesity   . OSA (obstructive sleep apnea)     uses C-PAP  . Anxiety   . Depression   . Peripheral vascular disease ([place])     blood clot in right leg  . GERD (gastroesophageal reflux disease)     uses Nexium  . Seizures ([place])     hasn't had any for 4 years  . Arthritis     osteoarthritis  . Anemia   . DVT (deep venous thrombosis) ([place])   . PE (pulmonary embolism)     Past Surgical History  Procedure Laterality Date  . Total hip arthroplasty    . Lumbar disc surgery    . Left foot surgery    . Left hand surgery x 2    . Right hand surgery for torn ligament    . Colonoscopy    . Cardiac catheterization    . Dilation and curettage of uterus    . Anterior cervical decomp/discectomy fusion N/A 06/25/2013    Procedure: ANTERIOR CERVICAL DECOMPRESSION/DISCECTOMY FUSION 2 LEVELS LEVELS C FOUR/FIVE, SIX/SEVEN;  Surgeon: Floyce Stakes, MD;  Location: [facility];  Service: Neurosurgery;  Laterality: N/A;    There were no vitals filed for this visit.  Visit Diagnosis:   Neck pain  Midline low back pain without sciatica  Weakness generalized  Decreased range of motion of neck  Decreased functional activity tolerance  Unsteadiness  Balance problems      Subjective Assessment - 05/27/15 1019    Subjective This 50yo female presents to PT for evaluation of neck pain / cervical radiculopathy. She has PMH of OA, Chronic pain syndrome, neuropathic pain, osteoporosis, lumbar sg, cervical sg, Right THR and recurrent falls.    Patient Stated Goals She wants to move better, no neck pain, stop cramp at base of anterior neck (started after cervical sg)   Currently in Pain? Yes   Pain Score 7   In last week, worst 10/10, best 7/10   Pain Location Neck   Pain Orientation Left;Anterior;Posterior   Pain Descriptors / Indicators Burning;Cramping   Pain Type Chronic pain   Pain Radiating Towards left arm to elbow   Pain Onset More than a month ago   Pain Frequency Constant   Aggravating Factors  move around, driving   Pain Relieving Factors being still,    Effect of Pain on Daily Activities limits neck movements.   Multiple Pain Sites Yes   Pain Score 5  in last week, worst 9/10, best 5/10   Pain Location Back   Pain Orientation Mid   Pain Descriptors / Indicators Squeezing   Pain Type Chronic pain   Pain Radiating Towards legs cramp   Pain Onset More than a month ago   Pain Frequency Constant   Aggravating Factors  sit too long, hurts in morning upon awakening, standing too long   Pain Relieving Factors heat pad, when initially lays down            [facility] PT Assessment - 05/27/15 1015    Assessment   Medical Diagnosis Cervical Radiculopathy   Referring Provider Ankit Lorie Phenix, MD   Onset Date/Surgical Date 04/30/15   Hand Dominance Right   Precautions   Precautions Fall   Restrictions   Weight Bearing Restrictions No   Balance Screen   Has the patient fallen in the past 6 months Yes   How many times? 1-3 /wk  leg gives out,    Has the  patient had a decrease in activity level because of a fear of falling?  Yes   Is the patient reluctant to leave their home because of a fear of falling?  Yes   [place] residence   Living Arrangements Children  27yo son   Type of Home Apartment   Home Access Level entry   Home Layout One level   Prior Function   Level of Independence Independent;Independent with gait   Vocation On disability   Observation/Other Assessments   Focus on Therapeutic Outcomes (FOTO)  40.95 Functional Status   Neck Disability Index  46.0%   Fear Avoidance Belief Questionnaire (FABQ)  72 (19)   Posture/Postural Control   Posture/Postural Control Postural limitations   Postural Limitations Rounded Shoulders;Forward head;Increased lumbar lordosis;Increased thoracic kyphosis;Flexed trunk   ROM / Strength   AROM / PROM / Strength AROM;Strength   AROM   Overall AROM  Deficits   AROM Assessment Site Cervical;Shoulder   Right/Left Shoulder Right;Left   Right Shoulder Flexion 105 Degrees   Left Shoulder Flexion 97 Degrees   Cervical Flexion 75% AROM  painful endfeel in posterior left cervical    Cervical Extension 50% AROM  painful endfeel in anterior left cervical   Cervical - Right Side Bend 12*   painful endfeel with left upper trap stretch   Cervical - Left Side Bend 14*  Painful endfeel with compression of cervical vertabrae   Cervical - Right Rotation 35*  no increase pain, muscle tightness limits ROM   Cervical - Left Rotation 28*  painful endfeel in posterior left cervical   Strength   Overall Strength Deficits;Due to pain   Overall Strength Comments UEs decreased strength but unable to MMT due to pain  LE weakness noted, pt reports primary reason for falls   Palpation   Palpation comment Tightness & muscle spasms /trigger points in left SCM, upper Trapezuis, paraspinals C3-T7   Special Tests    Special Tests Cervical   Cervical Tests Dictraction;Spurling's    Spurling's   Findings Positive   Side Left   Distraction Test   Findngs Positive   side Left   Ambulation/Gait   Ambulation/Gait Yes   Ambulation/Gait Assistance 5: Supervision   Ambulation Distance (Feet) 100 Feet   Assistive device None   Gait Pattern Shuffle;Step-to pattern;Decreased arm swing - right;Decreased arm swing - left;Decreased stride length;Decreased hip/knee flexion - right;Decreased hip/knee flexion - left;Trunk flexed;Wide base of support   Ambulation Surface  Indoor;Level   Gait velocity 1.22 ft/sec   Standardized Balance Assessment   Standardized Balance Assessment Berg Balance Test   Berg Balance Test   Sit to Stand Able to stand  independently using hands   Standing Unsupported Able to stand safely 2 minutes   Sitting with Back Unsupported but Feet Supported on Floor or Stool Able to sit safely and securely 2 minutes   Stand to Sit Controls descent by using hands   Transfers Able to transfer safely, definite need of hands   Standing Unsupported with Eyes Closed Able to stand 10 seconds with supervision   Standing Ubsupported with Feet Together Able to place feet together independently and stand for 1 minute with supervision   From Standing, Reach Forward with Outstretched Arm Can reach forward >12 cm safely (5")   From Standing Position, Pick up Object from Floor Able to pick up shoe, needs supervision   From Standing Position, Turn to Look Behind Over each Shoulder Needs supervision when turning   Turn 360 Degrees Needs close supervision or verbal cueing   Standing Unsupported, Alternately Place Feet on Step/Stool Able to complete >2 steps/needs minimal assist   Standing Unsupported, One Foot in Front Needs help to step but can hold 15 seconds   Standing on One Leg Tries to lift leg/unable to hold 3 seconds but remains standing independently   Total Score 34                             PT Short Term Goals - 05/27/15 1327    PT SHORT  TERM GOAL #1   Title Patient reports cervical pain </= 7/10 highest in 1 week period. (Target Date: 06/26/2015)   Time 1   Period Weeks   Status New   PT SHORT TERM GOAL #2   Title Patient demonstrates initial HEP for cervical pain, ROM & strength.  (Target Date: 06/26/2015)   Time 1   Period Months   Status New   PT SHORT TERM GOAL #3   Title Patient demonstrates HEP for LE strength & balance to decrease falls.  (Target Date: 06/26/2015)   Time 1   Period Months   Status New   PT SHORT TERM GOAL #4   Title Patient reports able to brush her hair intermittently raising hands to head over 5 minute period with pain increasing <3 increments on 0-10 scale.  (Target Date: 06/26/2015)   Time 1   Period Months   Status New           PT Long Term Goals - 05/27/15 1335    PT LONG TERM GOAL #1   Title Patient reports cervical pain </= 6/10 over 1 week period with ADLs.  (Target Date: 07/24/2015)   Time 2   Period Months   Status New   PT LONG TERM GOAL #2   Title Patient demonstrates HEP / fitness plan including cervical & LE exercises.  (Target Date: 07/24/2015)   Time 2   Period Months   Status New   PT LONG TERM GOAL #3   Title Patient self reports using FOTO 10% improvement in Neck Disability Index.  (Target Date: 07/24/2015)   Time 2   Period Months   Status New   PT LONG TERM GOAL #4   Title Berg Balance >/= 45/56 to indicate lower fall risk.  (Target Date: 07/24/2015)   Time 2   Period Months   Status New  Plan - 06/12/15 1229    Clinical Impression Statement Patient has increased neck pain with cervical movements & UE use limiting ADLs, driving and activities with her teenage son. She has decreased cervical & shoulder ROM & strength limiting activities. She has muscle spasms in SCM anteriorly and trapezuis & paraspinals posteriorly. She has weakness in LEs from decreased activity level that are causing repetetive falls. Berg Balance of 34/56 indicates fall  risk. Due to recurrent falls & fluctuating pain, condition appears to be evolving. Plan of care is moderate complexity.    Pt will benefit from skilled therapeutic intervention in order to improve on the following deficits Abnormal gait;Decreased activity tolerance;Decreased balance;Decreased endurance;Decreased mobility;Decreased range of motion;Decreased strength;Increased muscle spasms;Impaired UE functional use;Postural dysfunction;Pain   Rehab Potential Good   PT Frequency 2x / week   PT Duration 8 weeks   PT Treatment/Interventions ADLs/Self Care Home Management;Cryotherapy;Electrical Stimulation;Moist Heat;Traction;Ultrasound;DME Instruction;Gait training;Functional mobility training;Therapeutic activities;Therapeutic exercise;Balance training;Neuromuscular re-education;Patient/family education;Manual techniques;Passive range of motion;Dry needling   PT Next Visit Plan Teach HEP for cervical ROM and hip strength. Modalities for pain management.    Consulted and Agree with Plan of Care Patient          G-Codes - June 12, 2015 1348    Functional Assessment Tool Used Neck pain 7-9 / 10 with ADLs limiting tolerance   Functional Limitation Self care   Self Care Current Status [id number]) At least 60 percent but less than 80 percent impaired, limited or restricted   Self Care Goal Status [id number]) At least 40 percent but less than 60 percent impaired, limited or restricted       Problem List Patient Active Problem List   Diagnosis Date Noted  . Difficulty hearing 10/05/2014  . 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 Deep vein thrombosis of lower extremity ([place]) 10/05/2014  . Edema extremities 10/05/2014  . Esophagitis, reflux 10/05/2014  . Degenerative arthritis of hip 10/05/2014  . Depression, major, single episode, in partial remission ([place]) 10/05/2014  . Pulmonary infarction ([place]) 10/05/2014  . Idiopathic insomnia 10/05/2014  . Cervical stenosis of spinal canal 06/25/2013  . Postural dizziness  06/22/2012  . Essential hypertension 06/22/2012  . Morbid obesity ([place]) 08/17/2010  . UNSPECIFIED ENDOCRINE DISORDER 05/15/2009  . CHEST PAIN UNSPECIFIED 01/08/2009  . DEPRESSION 08/21/2008  . OSTEOPOROSIS 08/21/2008  . LONG QT SYNDROME 08/21/2008    Jamey Reas PT, DPT 06-12-2015, 1:49 PM  [facility] [address] [place], [place], [address] Phone: [phone]   Fax:  [phone]  Name: [name] MRN: [id number] Date of Birth: [birth year]

## 2015-05-28 ENCOUNTER — Ambulatory Visit: Payer: Medicare Other | Admitting: Physical Therapy

## 2015-05-28 DIAGNOSIS — M545 Low back pain, unspecified: Secondary | ICD-10-CM

## 2015-05-28 DIAGNOSIS — R2681 Unsteadiness on feet: Secondary | ICD-10-CM | POA: Diagnosis not present

## 2015-05-28 DIAGNOSIS — R29898 Other symptoms and signs involving the musculoskeletal system: Secondary | ICD-10-CM

## 2015-05-28 DIAGNOSIS — R531 Weakness: Secondary | ICD-10-CM

## 2015-05-28 DIAGNOSIS — R6889 Other general symptoms and signs: Secondary | ICD-10-CM

## 2015-05-28 DIAGNOSIS — M542 Cervicalgia: Secondary | ICD-10-CM | POA: Diagnosis not present

## 2015-05-28 DIAGNOSIS — R2689 Other abnormalities of gait and mobility: Secondary | ICD-10-CM

## 2015-05-28 NOTE — Therapy (Signed)
[place], [place], [address] Phone: [phone]   Fax:  [phone]  Physical Therapy Treatment  Patient Details  Name: [name] MRN: [id number] Date of Birth: [birth year] Referring Provider: Jamse Arn, MD  Encounter Date: 05/28/2015      PT End of Session - 05/28/15 1029    Visit Number 2   Number of Visits 18   Date for PT Re-Evaluation 07/24/15   PT Start Time 1020   PT Stop Time 1110   PT Time Calculation (min) 50 min   Activity Tolerance Patient tolerated treatment well   Behavior During Therapy [facility] for tasks assessed/performed      Past Medical History  Diagnosis Date  . Prolonged QT interval     gene positive  . HTN (hypertension)   . Atypical chest pain     negative Myoview in 2011 with no ischemi and normal LV function.   . Obesity   . OSA (obstructive sleep apnea)     uses C-PAP  . Anxiety   . Depression   . Peripheral vascular disease ([place])     blood clot in right leg  . GERD (gastroesophageal reflux disease)     uses Nexium  . Seizures ([place])     hasn't had any for 4 years  . Arthritis     osteoarthritis  . Anemia   . DVT (deep venous thrombosis) ([place])   . PE (pulmonary embolism)     Past Surgical History  Procedure Laterality Date  . Total hip arthroplasty    . Lumbar disc surgery    . Left foot surgery    . Left hand surgery x 2    . Right hand surgery for torn ligament    . Colonoscopy    . Cardiac catheterization    . Dilation and curettage of uterus    . Anterior cervical decomp/discectomy fusion N/A 06/25/2013    Procedure: ANTERIOR CERVICAL DECOMPRESSION/DISCECTOMY FUSION 2 LEVELS LEVELS C FOUR/FIVE, SIX/SEVEN;  Surgeon: Floyce Stakes, MD;  Location: [facility];  Service: Neurosurgery;  Laterality: N/A;    There were no vitals filed for this visit.  Visit Diagnosis:  Neck pain  Midline low back pain without sciatica  Weakness  generalized  Decreased range of motion of neck  Decreased functional activity tolerance  Unsteadiness  Balance problems      Subjective Assessment - 05/28/15 1028    Subjective Pain increased after yesterday's eval.  Neck is a 5/10 and back is an 8/10. See below for full pain assessment done yesterday.             [facility] PT Assessment - 05/27/15 1015    Assessment   Medical Diagnosis Cervical Radiculopathy   Referring Provider Ankit Lorie Phenix, MD   Onset Date/Surgical Date 04/30/15   Hand Dominance Right   Precautions   Precautions Fall   Restrictions   Weight Bearing Restrictions No   Balance Screen   Has the patient fallen in the past 6 months Yes   How many times? 1-3 /wk  leg gives out,    Has the patient had a decrease in activity level because of a fear of falling?  Yes   Is the patient reluctant to leave their home because of a fear of falling?  Yes   Home Environment   Living Environment Private residence   Living Arrangements Children  64yo son   Type of Home Apartment  Home Access Level entry   Home Layout One level   Prior Function   Level of Independence Independent;Independent with gait   Vocation On disability   Observation/Other Assessments   Focus on Therapeutic Outcomes (FOTO)  40.95 Functional Status   Neck Disability Index  46.0%   Fear Avoidance Belief Questionnaire (FABQ)  72 (19)   Posture/Postural Control   Posture/Postural Control Postural limitations   Postural Limitations Rounded Shoulders;Forward head;Increased lumbar lordosis;Increased thoracic kyphosis;Flexed trunk   ROM / Strength   AROM / PROM / Strength AROM;Strength   AROM   Overall AROM  Deficits   AROM Assessment Site Cervical;Shoulder   Right/Left Shoulder Right;Left   Right Shoulder Flexion 105 Degrees   Left Shoulder Flexion 97 Degrees   Cervical Flexion 75% AROM  painful endfeel in posterior left cervical    Cervical Extension 50% AROM  painful endfeel in anterior  left cervical   Cervical - Right Side Bend 12*   painful endfeel with left upper trap stretch   Cervical - Left Side Bend 14*  Painful endfeel with compression of cervical vertabrae   Cervical - Right Rotation 35*  no increase pain, muscle tightness limits ROM   Cervical - Left Rotation 28*  painful endfeel in posterior left cervical   Strength   Overall Strength Deficits;Due to pain   Overall Strength Comments UEs decreased strength but unable to MMT due to pain  LE weakness noted, pt reports primary reason for falls   Palpation   Palpation comment Tightness & muscle spasms /trigger points in left SCM, upper Trapezuis, paraspinals C3-T7   Special Tests    Special Tests Cervical   Cervical Tests Dictraction;Spurling's   Spurling's   Findings Positive   Side Left   Distraction Test   Findngs Positive   side Left   Ambulation/Gait   Ambulation/Gait Yes   Ambulation/Gait Assistance 5: Supervision   Ambulation Distance (Feet) 100 Feet   Assistive device None   Gait Pattern Shuffle;Step-to pattern;Decreased arm swing - right;Decreased arm swing - left;Decreased stride length;Decreased hip/knee flexion - right;Decreased hip/knee flexion - left;Trunk flexed;Wide base of support   Ambulation Surface Indoor;Level   Gait velocity 1.22 ft/sec   Standardized Balance Assessment   Standardized Balance Assessment Berg Balance Test   Berg Balance Test   Sit to Stand Able to stand  independently using hands   Standing Unsupported Able to stand safely 2 minutes   Sitting with Back Unsupported but Feet Supported on Floor or Stool Able to sit safely and securely 2 minutes   Stand to Sit Controls descent by using hands   Transfers Able to transfer safely, definite need of hands   Standing Unsupported with Eyes Closed Able to stand 10 seconds with supervision   Standing Ubsupported with Feet Together Able to place feet together independently and stand for 1 minute with supervision   From  Standing, Reach Forward with Outstretched Arm Can reach forward >12 cm safely (5")   From Standing Position, Pick up Object from Floor Able to pick up shoe, needs supervision   From Standing Position, Turn to Look Behind Over each Shoulder Needs supervision when turning   Turn 360 Degrees Needs close supervision or verbal cueing   Standing Unsupported, Alternately Place Feet on Step/Stool Able to complete >2 steps/needs minimal assist   Standing Unsupported, One Foot in Front Needs help to step but can hold 15 seconds   Standing on One Leg Tries to lift leg/unable to hold 3 seconds but  remains standing independently   Total Score 34                     [facility] Adult PT Treatment/Exercise - 05/28/15 1030    Neck Exercises: Supine   Neck Retraction 10 reps   Neck Retraction Limitations HEP   Cervical Rotation 10 reps   Cervical Rotation Limitations HEP    Shoulder Flexion Both;10 reps   Lumbar Exercises: Stretches   Lower Trunk Rotation 10 seconds   Lower Trunk Rotation Limitations x 10 HEP   Pelvic Tilt 10 seconds   Pelvic Tilt Limitations x 10 HEP   Lumbar Exercises: Aerobic   Stationary Bike NuStep L 4 , 6 min UE and LE, patient cvery pleased that she could do this    Moist Heat Therapy   Number Minutes Moist Heat 15 Minutes   Moist Heat Location Cervical;Lumbar Spine   Electrical Stimulation   Electrical Stimulation Location cervical and lumbar   Electrical Stimulation Action IFC   Electrical Stimulation Parameters to tol (14)   Electrical Stimulation Goals Pain                PT Education - 05/28/15 1028    Education provided Yes   Education Details HEP   Person(s) Educated Patient   Methods Explanation;Demonstration;Handout   Comprehension Verbal cues required;Verbalized understanding;Returned demonstration          PT Short Term Goals - 05/27/15 1327    PT SHORT TERM GOAL #1   Title Patient reports cervical pain </= 7/10 highest in 1 week  period. (Target Date: 06/26/2015)   Time 1   Period Weeks   Status New   PT SHORT TERM GOAL #2   Title Patient demonstrates initial HEP for cervical pain, ROM & strength.  (Target Date: 06/26/2015)   Time 1   Period Months   Status New   PT SHORT TERM GOAL #3   Title Patient demonstrates HEP for LE strength & balance to decrease falls.  (Target Date: 06/26/2015)   Time 1   Period Months   Status New   PT SHORT TERM GOAL #4   Title Patient reports able to brush her hair intermittently raising hands to head over 5 minute period with pain increasing <3 increments on 0-10 scale.  (Target Date: 06/26/2015)   Time 1   Period Months   Status New           PT Long Term Goals - 05/27/15 1335    PT LONG TERM GOAL #1   Title Patient reports cervical pain </= 6/10 over 1 week period with ADLs.  (Target Date: 07/24/2015)   Time 2   Period Months   Status New   PT LONG TERM GOAL #2   Title Patient demonstrates HEP / fitness plan including cervical & LE exercises.  (Target Date: 07/24/2015)   Time 2   Period Months   Status New   PT LONG TERM GOAL #3   Title Patient self reports using FOTO 10% improvement in Neck Disability Index.  (Target Date: 07/24/2015)   Time 2   Period Months   Status New   PT LONG TERM GOAL #4   Title Berg Balance >/= 45/56 to indicate lower fall risk.  (Target Date: 07/24/2015)   Time 2   Period Months   Status New               Plan - 05/28/15 1246    Clinical Impression Statement  Patient was able to exercise today without pain increase, in fact, after modalities pain was brought down to a 2/10. Given HEP for Cervical and lumbar flexibility   PT Next Visit Plan Begin strengthening, repeat NuStep and modalities, check HEP    PT Home Exercise Plan C rotation, chin tuck, LTR and PPT   Consulted and Agree with Plan of Care Patient          G-Codes - June 02, 2015 1348    Functional Assessment Tool Used Neck pain 7-9 / 10 with ADLs limiting tolerance    Functional Limitation Self care   Self Care Current Status [id number]) At least 60 percent but less than 80 percent impaired, limited or restricted   Self Care Goal Status [id number]) At least 40 percent but less than 60 percent impaired, limited or restricted      Problem List Patient Active Problem List   Diagnosis Date Noted  . Difficulty hearing 10/05/2014  . 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 Deep vein thrombosis of lower extremity ([place]) 10/05/2014  . Edema extremities 10/05/2014  . Esophagitis, reflux 10/05/2014  . Degenerative arthritis of hip 10/05/2014  . Depression, major, single episode, in partial remission ([place]) 10/05/2014  . Pulmonary infarction ([place]) 10/05/2014  . Idiopathic insomnia 10/05/2014  . Cervical stenosis of spinal canal 06/25/2013  . Postural dizziness 06/22/2012  . Essential hypertension 06/22/2012  . Morbid obesity ([place]) 08/17/2010  . UNSPECIFIED ENDOCRINE DISORDER 05/15/2009  . CHEST PAIN UNSPECIFIED 01/08/2009  . DEPRESSION 08/21/2008  . OSTEOPOROSIS 08/21/2008  . LONG QT SYNDROME 08/21/2008    PAA,JENNIFER 05/28/2015, 12:49 PM  [facility] [address] [place], [place], [address] Phone: [phone]   Fax:  [phone]  Name: [name] MRN: [id number] Date of Birth: [birth year]    Raeford Razor, PT 05/28/2015 12:49 PM Phone: [phone] Fax: [phone]

## 2015-05-29 DIAGNOSIS — H35033 Hypertensive retinopathy, bilateral: Secondary | ICD-10-CM | POA: Diagnosis not present

## 2015-06-02 ENCOUNTER — Ambulatory Visit: Payer: Medicare Other | Admitting: Physical Therapy

## 2015-06-02 DIAGNOSIS — M545 Low back pain, unspecified: Secondary | ICD-10-CM

## 2015-06-02 DIAGNOSIS — R2681 Unsteadiness on feet: Secondary | ICD-10-CM

## 2015-06-02 DIAGNOSIS — R2689 Other abnormalities of gait and mobility: Secondary | ICD-10-CM

## 2015-06-02 DIAGNOSIS — R6889 Other general symptoms and signs: Secondary | ICD-10-CM

## 2015-06-02 DIAGNOSIS — R29898 Other symptoms and signs involving the musculoskeletal system: Secondary | ICD-10-CM

## 2015-06-02 DIAGNOSIS — R531 Weakness: Secondary | ICD-10-CM | POA: Diagnosis not present

## 2015-06-02 DIAGNOSIS — M542 Cervicalgia: Secondary | ICD-10-CM | POA: Diagnosis not present

## 2015-06-02 NOTE — Therapy (Signed)
[place], [place], [address] Phone: [phone]   Fax:  [phone]  Physical Therapy Treatment  Patient Details  Name: [name] MRN: [id number] Date of Birth: [birth year] Referring Provider: Jamse Arn, MD  Encounter Date: 06/02/2015      PT End of Session - 06/02/15 0919    Visit Number 3   Number of Visits 18   Date for PT Re-Evaluation 07/24/15   PT Start Time 0905   PT Stop Time 0947   PT Time Calculation (min) 42 min   Activity Tolerance Patient tolerated treatment well   Behavior During Therapy [facility] for tasks assessed/performed      Past Medical History  Diagnosis Date  . Prolonged QT interval     gene positive  . HTN (hypertension)   . Atypical chest pain     negative Myoview in 2011 with no ischemi and normal LV function.   . Obesity   . OSA (obstructive sleep apnea)     uses C-PAP  . Anxiety   . Depression   . Peripheral vascular disease ([employer])     blood clot in right leg  . GERD (gastroesophageal reflux disease)     uses Nexium  . Seizures ([place])     hasn't had any for 4 years  . Arthritis     osteoarthritis  . Anemia   . DVT (deep venous thrombosis) ([place])   . PE (pulmonary embolism)     Past Surgical History  Procedure Laterality Date  . Total hip arthroplasty    . Lumbar disc surgery    . Left foot surgery    . Left hand surgery x 2    . Right hand surgery for torn ligament    . Colonoscopy    . Cardiac catheterization    . Dilation and curettage of uterus    . Anterior cervical decomp/discectomy fusion N/A 06/25/2013    Procedure: ANTERIOR CERVICAL DECOMPRESSION/DISCECTOMY FUSION 2 LEVELS LEVELS C FOUR/FIVE, SIX/SEVEN;  Surgeon: Floyce Stakes, MD;  Location: [facility];  Service: Neurosurgery;  Laterality: N/A;    There were no vitals filed for this visit.  Visit Diagnosis:  Neck pain  Midline low back pain without sciatica  Weakness  generalized  Decreased range of motion of neck  Decreased functional activity tolerance  Unsteadiness  Balance problems            [facility] Adult PT Treatment/Exercise - 06/02/15 0915    Lumbar Exercises: Stretches   Lower Trunk Rotation 10 seconds   Lower Trunk Rotation Limitations x 10 HEP   Pelvic Tilt 10 seconds   Pelvic Tilt Limitations x 10 HEP   Lumbar Exercises: Aerobic   Stationary Bike NuStep level 3 UE and LE 6 min    Moist Heat Therapy   Number Minutes Moist Heat 15 Minutes   Moist Heat Location Cervical;Lumbar Spine   Electrical Stimulation   Electrical Stimulation Location cervical and lumbar spine    Electrical Stimulation Action IFC   Electrical Stimulation Parameters to tol    Electrical Stimulation Goals Pain   Neck Exercises: Stretches   Upper Trapezius Stretch 2 reps;30 seconds   Levator Stretch 2 reps;30 seconds   Other Neck Stretches rotation each side with gentle overpressure                PT Education - 06/02/15 0925    Education provided Yes   Education Details HEP review  Person(s) Educated Patient   Methods Explanation;Demonstration;Verbal cues   Comprehension Verbalized understanding          PT Short Term Goals - 06/02/15 0919    PT SHORT TERM GOAL #1   Title Patient reports cervical pain </= 7/10 highest in 1 week period. (Target Date: 06/26/2015)   Status On-going   PT SHORT TERM GOAL #2   Title Patient demonstrates initial HEP for cervical pain, ROM & strength.  (Target Date: 06/26/2015)   Status On-going   PT SHORT TERM GOAL #3   Title Patient demonstrates HEP for LE strength & balance to decrease falls.  (Target Date: 06/26/2015)   Status On-going   PT SHORT TERM GOAL #4   Title Patient reports able to brush her hair intermittently raising hands to head over 5 minute period with pain increasing <3 increments on 0-10 scale.  (Target Date: 06/26/2015)   Status On-going           PT Long Term Goals - 06/02/15 0919     PT LONG TERM GOAL #1   Title Patient reports cervical pain </= 6/10 over 1 week period with ADLs.  (Target Date: 07/24/2015)   Status On-going   PT LONG TERM GOAL #2   Title Patient demonstrates HEP / fitness plan including cervical & LE exercises.  (Target Date: 07/24/2015)   Status On-going   PT LONG TERM GOAL #3   Title Patient self reports using FOTO 10% improvement in Neck Disability Index.  (Target Date: 07/24/2015)   Status On-going   PT LONG TERM GOAL #4   Title Berg Balance >/= 45/56 to indicate lower fall risk.  (Target Date: 07/24/2015)   Status On-going               Plan - 06/02/15 0924    Clinical Impression Statement Pt with good response from exercise and modailities. Sore from spending the weekend with her aunt whom she helped prevent a fall.  Performed neck stretches with increased pain.     PT Next Visit Plan Begin strengthening, repeat NuStep and modalities, check HEP    PT Home Exercise Plan C rotation, chin tuck, LTR and PPT   Consulted and Agree with Plan of Care Patient        Problem List Patient Active Problem List   Diagnosis Date Noted  . Difficulty hearing 10/05/2014  . 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 Deep vein thrombosis of lower extremity ([place]) 10/05/2014  . Edema extremities 10/05/2014  . Esophagitis, reflux 10/05/2014  . Degenerative arthritis of hip 10/05/2014  . Depression, major, single episode, in partial remission ([place]) 10/05/2014  . Pulmonary infarction ([place]) 10/05/2014  . Idiopathic insomnia 10/05/2014  . Cervical stenosis of spinal canal 06/25/2013  . Postural dizziness 06/22/2012  . Essential hypertension 06/22/2012  . Morbid obesity ([place]) 08/17/2010  . UNSPECIFIED ENDOCRINE DISORDER 05/15/2009  . CHEST PAIN UNSPECIFIED 01/08/2009  . DEPRESSION 08/21/2008  . OSTEOPOROSIS 08/21/2008  . LONG QT SYNDROME 08/21/2008    Iran Rowe 06/02/2015, 9:37 AM  [facility] [address] [place], [place], [address] Phone: [phone]   Fax:  [phone]  Name: UCHENNA GOURD MRN: [id number] Date of Birth: 09/06/1965    Raeford Razor, PT 06/02/2015 9:37 AM Phone: [phone] Fax: [phone]

## 2015-06-05 ENCOUNTER — Ambulatory Visit: Payer: Medicare Other | Attending: Physical Medicine & Rehabilitation | Admitting: Physical Therapy

## 2015-06-05 DIAGNOSIS — M542 Cervicalgia: Secondary | ICD-10-CM

## 2015-06-05 DIAGNOSIS — R2681 Unsteadiness on feet: Secondary | ICD-10-CM | POA: Diagnosis not present

## 2015-06-05 DIAGNOSIS — R6889 Other general symptoms and signs: Secondary | ICD-10-CM | POA: Diagnosis not present

## 2015-06-05 DIAGNOSIS — R29898 Other symptoms and signs involving the musculoskeletal system: Secondary | ICD-10-CM | POA: Diagnosis not present

## 2015-06-05 DIAGNOSIS — R29818 Other symptoms and signs involving the nervous system: Secondary | ICD-10-CM | POA: Diagnosis not present

## 2015-06-05 DIAGNOSIS — R531 Weakness: Secondary | ICD-10-CM | POA: Insufficient documentation

## 2015-06-05 DIAGNOSIS — M545 Low back pain, unspecified: Secondary | ICD-10-CM

## 2015-06-05 NOTE — Therapy (Signed)
[place], [place], [address] Phone: [phone]   Fax:  [phone]  Physical Therapy Treatment  Patient Details  Name: [name] MRN: [id number] Date of Birth: February 26, 1966 Referring Provider: Jamse Arn, MD  Encounter Date: 06/05/2015      PT End of Session - 06/05/15 1205    Visit Number 4   Number of Visits 18   Date for PT Re-Evaluation 07/24/15   Authorization Type Medicare G-code & progress note every 10 visits   PT Start Time 1015   PT Stop Time 1115   PT Time Calculation (min) 60 min      Past Medical History  Diagnosis Date  . Prolonged QT interval     gene positive  . HTN (hypertension)   . Atypical chest pain     negative Myoview in 2011 with no ischemi and normal LV function.   . Obesity   . OSA (obstructive sleep apnea)     uses C-PAP  . Anxiety   . Depression   . Peripheral vascular disease ([place])     blood clot in right leg  . GERD (gastroesophageal reflux disease)     uses Nexium  . Seizures ([place])     hasn't had any for 4 years  . Arthritis     osteoarthritis  . Anemia   . DVT (deep venous thrombosis) ([place])   . PE (pulmonary embolism)     Past Surgical History  Procedure Laterality Date  . Total hip arthroplasty    . Lumbar disc surgery    . Left foot surgery    . Left hand surgery x 2    . Right hand surgery for torn ligament    . Colonoscopy    . Cardiac catheterization    . Dilation and curettage of uterus    . Anterior cervical decomp/discectomy fusion N/A 06/25/2013    Procedure: ANTERIOR CERVICAL DECOMPRESSION/DISCECTOMY FUSION 2 LEVELS LEVELS C FOUR/FIVE, SIX/SEVEN;  Surgeon: Floyce Stakes, MD;  Location: [facility];  Service: Neurosurgery;  Laterality: N/A;    There were no vitals filed for this visit.  Visit Diagnosis:  Neck pain  Weakness generalized  Midline low back pain without sciatica  Decreased range of motion of  neck  Decreased functional activity tolerance  Unsteadiness      Subjective Assessment - 06/05/15 1021    Subjective The elcetrical stim helps for most of the day. The pain returns the next day.    Currently in Pain? Yes   Pain Score 7    Pain Location Neck   Pain Orientation Posterior;Anterior   Pain Descriptors / Indicators Burning;Cramping            [facility] PT Assessment - 06/05/15 0001    AROM   Cervical - Right Side Bend 26   Cervical - Left Side Bend 26   Cervical - Right Rotation 55   Cervical - Left Rotation 55                     [facility] Adult PT Treatment/Exercise - 06/05/15 0001    Neck Exercises: Supine   Neck Retraction 1 rep   Neck Retraction Limitations while performing cane pullovers x 10   Other Supine Exercise horizontal abduction yellow band x 10, ER yellow x 10   Lumbar Exercises: Stretches   Pelvic Tilt 10 seconds   Lumbar Exercises: Aerobic   Stationary Bike Nustep L4 x 10  min   Knee/Hip Exercises: Standing   SLS with Vectors UEs at couter top x 10 for flex, ext and abduction   Moist Heat Therapy   Moist Heat Location Cervical;Lumbar Spine   Electrical Stimulation   Electrical Stimulation Location cervical   Electrical Stimulation Action IFC x 15 minutes   Electrical Stimulation Parameters to tolerance   Electrical Stimulation Goals Pain                PT Education - 06/05/15 1204    Education provided Yes   Education Details Standing 3 way hip, bilateral   Person(s) Educated Patient   Methods Explanation;Handout   Comprehension Verbalized understanding          PT Short Term Goals - 06/02/15 0919    PT SHORT TERM GOAL #1   Title Patient reports cervical pain </= 7/10 highest in 1 week period. (Target Date: 06/26/2015)   Status On-going   PT SHORT TERM GOAL #2   Title Patient demonstrates initial HEP for cervical pain, ROM & strength.  (Target Date: 06/26/2015)   Status On-going   PT SHORT TERM GOAL #3   Title  Patient demonstrates HEP for LE strength & balance to decrease falls.  (Target Date: 06/26/2015)   Status On-going   PT SHORT TERM GOAL #4   Title Patient reports able to brush her hair intermittently raising hands to head over 5 minute period with pain increasing <3 increments on 0-10 scale.  (Target Date: 06/26/2015)   Status On-going           PT Long Term Goals - 06/02/15 0919    PT LONG TERM GOAL #1   Title Patient reports cervical pain </= 6/10 over 1 week period with ADLs.  (Target Date: 07/24/2015)   Status On-going   PT LONG TERM GOAL #2   Title Patient demonstrates HEP / fitness plan including cervical & LE exercises.  (Target Date: 07/24/2015)   Status On-going   PT LONG TERM GOAL #3   Title Patient self reports using FOTO 10% improvement in Neck Disability Index.  (Target Date: 07/24/2015)   Status On-going   PT LONG TERM GOAL #4   Title Berg Balance >/= 45/56 to indicate lower fall risk.  (Target Date: 07/24/2015)   Status On-going               Plan - 06/05/15 1205    Clinical Impression Statement Good improvement in cervical ROM. No change in neck pain per patient. She reports e stim helpful for short periods. Instructed pt in standing hip exercises for strength and proprioception and added to HEP. Faxed script to MD for [facility]. Pt would like to bill medicare.    PT Next Visit Plan Continue progressive Nustep, review standing hip hep. Progress neck HEP. Check for [facility] order.        Problem List Patient Active Problem List   Diagnosis Date Noted  . Difficulty hearing 10/05/2014  . 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 Deep vein thrombosis of lower extremity ([place]) 10/05/2014  . Edema extremities 10/05/2014  . Esophagitis, reflux 10/05/2014  . Degenerative arthritis of hip 10/05/2014  . Depression, major, single episode, in partial remission ([place]) 10/05/2014  . Pulmonary infarction ([place]) 10/05/2014  . Idiopathic insomnia 10/05/2014  . Cervical stenosis of spinal  canal 06/25/2013  . Postural dizziness 06/22/2012  . Essential hypertension 06/22/2012  . Morbid obesity ([place]) 08/17/2010  . UNSPECIFIED ENDOCRINE DISORDER 05/15/2009  . CHEST PAIN UNSPECIFIED 01/08/2009  .  DEPRESSION 08/21/2008  . OSTEOPOROSIS 08/21/2008  . LONG QT SYNDROME 08/21/2008    Dorene Ar, PTA 06/05/2015, 12:09 PM  [facility] [address] [place], [place], [address] Phone: [phone]   Fax:  [phone]  Name: [name] MRN: [id number] Date of Birth: Mar 19, 1966

## 2015-06-05 NOTE — Patient Instructions (Signed)
Knee High   Holding stable object, raise knee to hip level, then lower knee. Repeat with other knee. Complete __10_ repetitions. Do __2__ sessions per day.  ABDUCTION: Standing (Active)   HOLDING STABLE OBJECT LIKE COUNTER TOP Stand, feet flat. Lift right leg out to side. Use _0__ lbs. Complete __10_ repetitions. Perform __2_ sessions per day.   EXTENSION: Standing (Active)  HOLDING STABLE OBJECT LIKE COUNTER TOPStand, both feet flat. Draw right leg behind body as far as possible. Use 0___ lbs. Complete 10 repetitions. Perform __2_ sessions per day.  Copyright  VHI. All rights reserved.

## 2015-06-09 ENCOUNTER — Ambulatory Visit: Payer: Medicare Other | Admitting: Physical Therapy

## 2015-06-11 ENCOUNTER — Encounter: Payer: Self-pay | Admitting: Obstetrics and Gynecology

## 2015-06-11 ENCOUNTER — Ambulatory Visit (INDEPENDENT_AMBULATORY_CARE_PROVIDER_SITE_OTHER): Payer: Medicare Other | Admitting: Obstetrics and Gynecology

## 2015-06-11 VITALS — BP 117/74 | HR 76 | Ht 65.0 in | Wt 285.4 lb

## 2015-06-11 DIAGNOSIS — N920 Excessive and frequent menstruation with regular cycle: Secondary | ICD-10-CM | POA: Diagnosis not present

## 2015-06-11 DIAGNOSIS — Z6841 Body Mass Index (BMI) 40.0 and over, adult: Secondary | ICD-10-CM | POA: Diagnosis not present

## 2015-06-11 DIAGNOSIS — R351 Nocturia: Secondary | ICD-10-CM

## 2015-06-11 LAB — POCT URINALYSIS DIPSTICK
BILIRUBIN UA: 1
Glucose, UA: NEGATIVE
Ketones, UA: NEGATIVE
Leukocytes, UA: NEGATIVE
NITRITE UA: NEGATIVE
PH UA: 7.5
Protein, UA: NEGATIVE
Spec Grav, UA: 1.015
UROBILINOGEN UA: 0.2

## 2015-06-11 NOTE — Patient Instructions (Signed)
1.  Endometrial biopsy is done today for abnormal bleeding. 2.  Take Tylenol or ibuprofen for cramps. 3.  Return in 2 weeks for follow-up on biopsy and scheduling of surgery

## 2015-06-11 NOTE — Progress Notes (Signed)
GYN ENCOUNTER NOTE  Subjective:       [name] is a 50 y.o. No obstetric history on file. female is here for gynecologic evaluation of the following issues:  1. Menorrhagia: Patient started on Depo injection therapy back in 09/2013, but discontinued due to excessive weight gain. Patient has been dealing with the heavy bleeding with extra pads and tampons. Patient is anemic and take Fe supplement for it. States heavy cycles have been lasting 2 weeks now. Admits to fatigue, night sweats. Diarrhea during periods. Patient pap/mammogram being managed by PCP.   2. Urinary frequency: Positive urge symptoms with leakage. Nocturia, wakes up >4x/night for urination. Positive stress incontinence symptoms.    Gynecologic History Patient's last menstrual period was 05/29/2015 (exact date). Contraception: abstinence Last Pap:  01/2015 Results normal Last mammogram: 2015; lump on outer left quadrant of left breast, being followed, has not  changed  Obstetric History OB History  No data available    Past Medical History  Diagnosis Date  . Prolonged QT interval     gene positive  . HTN (hypertension)   . Atypical chest pain     negative Myoview in 2011 with no ischemi and normal LV function.   . Obesity   . OSA (obstructive sleep apnea)     uses C-PAP  . Anxiety   . Depression   . Peripheral vascular disease ([place])     blood clot in right leg  . GERD (gastroesophageal reflux disease)     uses Nexium  . Seizures ([place])     hasn't had any for 4 years  . Arthritis     osteoarthritis  . Anemia   . DVT (deep venous thrombosis) ([place])   . PE (pulmonary embolism)     Past Surgical History  Procedure Laterality Date  . Total hip arthroplasty    . Lumbar disc surgery    . Left foot surgery    . Left hand surgery x 2    . Right hand surgery for torn ligament    . Colonoscopy    . Cardiac catheterization    . Dilation and curettage of uterus    . Anterior cervical decomp/discectomy  fusion N/A 06/25/2013    Procedure: ANTERIOR CERVICAL DECOMPRESSION/DISCECTOMY FUSION 2 LEVELS LEVELS C FOUR/FIVE, SIX/SEVEN;  Surgeon: Floyce Stakes, MD;  Location: [facility];  Service: Neurosurgery;  Laterality: N/A;    Current Outpatient Prescriptions on File Prior to Visit  Medication Sig Dispense Refill  . albuterol (PROVENTIL HFA;VENTOLIN HFA) 108 (90 BASE) MCG/ACT inhaler Inhale 1-2 puffs into the lungs every 4 (four) hours as needed for wheezing or shortness of breath.    Marland Kitchen amitriptyline (ELAVIL) 150 MG tablet Take 1 tablet (150 mg total) by mouth at bedtime. 30 tablet 1  . apixaban (ELIQUIS) 5 MG TABS tablet Take 1 tablet by mouth 2 (two) times daily. Reported on 04/30/2015    . aspirin 81 MG tablet Take 81 mg by mouth daily.      . budesonide-formoterol (SYMBICORT) 160-4.5 MCG/ACT inhaler Inhale 2 puffs into the lungs 2 (two) times daily as needed (for shortness of breath).    . escitalopram (LEXAPRO) 20 MG tablet Take 20 mg by mouth daily.     Marland Kitchen esomeprazole (NEXIUM) 40 MG capsule Take 40 mg by mouth daily at 12 noon.    . Ferrous Sulfate (SLOW RELEASE IRON PO) Take 1 tablet by mouth daily.     . fluocinonide (LIDEX) 0.05 % external solution  Apply 1 application topically 2 (two) times daily.    . furosemide (LASIX) 20 MG tablet TAKE (1) TABLET BY MOUTH EVERY DAY 30 tablet 5  . Glucosamine-Chondroit-Vit C-Mn (GLUCOSAMINE 1500 COMPLEX) CAPS Take 1 each by mouth daily.    . hyoscyamine (LEVSIN SL) 0.125 MG SL tablet Take 1 tablet by mouth 2 (two) times daily as needed. For nausea    . labetalol (NORMODYNE) 200 MG tablet Take 1 tablet (200 mg total) by mouth 2 (two) times daily. 60 tablet 11  . lubiprostone (AMITIZA) 8 MCG capsule Take 1 capsule by mouth daily. Reported on 04/30/2015    . methocarbamol (ROBAXIN) 500 MG tablet Take 1 tablet (500 mg total) by mouth every 8 (eight) hours as needed for muscle spasms. 90 tablet 1  . Multiple Vitamins-Minerals (MULTIVITAMIN WITH MINERALS)  tablet Take 1 tablet by mouth daily.    . nabumetone (RELAFEN) 750 MG tablet Take 750 mg by mouth 2 (two) times daily.     . Omega-3 Fatty Acids (FISH OIL) 1000 MG CAPS Take 1 capsule by mouth 3 (three) times daily.      . potassium chloride SA (K-DUR,KLOR-CON) 20 MEQ tablet Take 1 tablet (20 mEq total) by mouth daily. 30 tablet 5  . spironolactone (ALDACTONE) 25 MG tablet Take 1 tablet (25 mg total) by mouth daily. 30 tablet 5  . triamterene-hydrochlorothiazide (MAXZIDE) 75-50 MG per tablet Take 1 tablet by mouth daily.    . VOLTAREN 1 % GEL Apply 2 g topically 4 (four) times daily as needed (for pain).      No current facility-administered medications on file prior to visit.    No Known Allergies  Social History   Social History  . Marital Status: Single    Spouse Name: N/A  . Number of Children: N/A  . Years of Education: N/A   Occupational History  . disabled    Social History Main Topics  . Smoking status: Former Smoker    Quit date: 08/16/2001  . Smokeless tobacco: Never Used  . Alcohol Use: 1.2 oz/week    2 Standard drinks or equivalent per week  . Drug Use: No  . Sexual Activity: No   Other Topics Concern  . Not on file   Social History Narrative    Family History  Problem Relation Age of Onset  . Long QT syndrome Mother   . Diabetes Father   . Diabetes Brother   . CAD Father   . CAD Mother     The following portions of the patient's history were reviewed and updated as appropriate: allergies, current medications, past family history, past medical history, past social history, past surgical history and problem list.  Review of Systems Review of Systems - General ROS: positive for fatigue, night sweats. Negative for - chills, fever, hot flashes, malaise Gastrointestinal ROS: negative for - abdominal pain, blood in stools, change in bowel habits and nausea/vomiting Respiratory: Positive for shortness of breath-- uses CPAP  Genito-Urinary ROS: see  above  Objective:   BP 117/74 mmHg  Pulse 76  Ht 5\' 5"  (1.651 m)  Wt 285 lb 6.4 oz (129.457 kg)  BMI 47.49 kg/m2  LMP 05/29/2015 (Exact Date) CONSTITUTIONAL: Well-developed, well-nourished female in no acute distress.  HENT:  Normocephalic, atraumatic.  NECK: Normal range of motion, supple, no masses.  Normal thyroid.  SKIN: Skin is warm and dry. No rash noted. Not diaphoretic. No erythema. No pallor. Omar: Alert and oriented to person, place, and time.  PSYCHIATRIC:  Normal mood and affect. Normal behavior. Normal judgment and thought content. CARDIOVASCULAR:Not Examined RESPIRATORY: Not Examined BREASTS: Not Examined ABDOMEN: large pannus PELVIC:  PELVIC: anthropoid pelvic   External Genitalia: Normal  BUS: Normal  Vagina: Normal  Cervix: Normal; no lesions; no cervical motion tenderness  Uterus: Normal size, shape,consistency, mobile. Midline.   Adnexa: Normal  RV: Normal   Bladder: Nontender  Endometrial Biopsy Procedure Note  Pre-operative Diagnosis: abnormal uterine bleeding  Post-operative Diagnosis: same  Procedure Details   Urine pregnancy test was not done.  The risks (including infection, bleeding, pain, and uterine perforation) and benefits of the procedure were explained to the patient and Verbal informed consent was obtained.  Antibiotic prophylaxis against endocarditis was not indicated.   The patient was placed in the dorsal lithotomy position.  Bimanual exam showed the uterus to be in the neutral position.  A Graves' speculum inserted in the vagina, and the cervix was grasped with a single-tooth tenaculum.  Endocervical curettage with a Kevorkian curette was not performed.  The uterus was sounded to 9 cm. A Mylex 23mm curette was used to sample the endometrium.  Sample was sent for pathologic examination.  Condition: Stable  Complications: None  Plan:  The patient was advised to call for any fever or for prolonged or severe pain or bleeding. She  was advised to use OTC acetaminophen and OTC ibuprofen as needed for mild to moderate pain. She was advised to avoid vaginal intercourse for 48 hours or until the bleeding has completely stopped.  Attending Physician Documentation: Brayton Mars, MD     Assessment:   1. Menorrhagia with regular cycle - Pathology - Endometrial biopsy obtained today. - Patient desires hysterectomy as definitive treatment   2. Nocturia - Urine culture - POCT urinalysis dipstick     Plan:   1. Endometrial biopsy obtained today for menorrhagia. 2. UA and culture today to rule out UTI for nocturia  3. Patient desires hysterectomy as definitive treatment for menorrhagia  4. Take Tylenol or ibuprofen for cramps.  5. Follow up in 2 weeks for biopsy result and discussion of surgical management  Amy Tasia Catchings, PA-S Brayton Mars, MD   I have seen, interviewed, and examined the patient in conjunction with the [facility].A. student and affirm the diagnosis and management plan. Molly Savarino A. Mariateresa Batra, MD, FACOG   Note: This dictation was prepared with Dragon dictation along with smaller phrase technology. Any transcriptional errors that result from this process are unintentional.

## 2015-06-12 ENCOUNTER — Ambulatory Visit: Payer: Medicare Other | Admitting: Physical Therapy

## 2015-06-12 DIAGNOSIS — M545 Low back pain, unspecified: Secondary | ICD-10-CM

## 2015-06-12 DIAGNOSIS — R2681 Unsteadiness on feet: Secondary | ICD-10-CM | POA: Diagnosis not present

## 2015-06-12 DIAGNOSIS — R6889 Other general symptoms and signs: Secondary | ICD-10-CM

## 2015-06-12 DIAGNOSIS — R29898 Other symptoms and signs involving the musculoskeletal system: Secondary | ICD-10-CM | POA: Diagnosis not present

## 2015-06-12 DIAGNOSIS — R531 Weakness: Secondary | ICD-10-CM | POA: Diagnosis not present

## 2015-06-12 DIAGNOSIS — R2689 Other abnormalities of gait and mobility: Secondary | ICD-10-CM

## 2015-06-12 DIAGNOSIS — M542 Cervicalgia: Secondary | ICD-10-CM

## 2015-06-12 LAB — URINE CULTURE: ORGANISM ID, BACTERIA: NO GROWTH

## 2015-06-12 NOTE — Therapy (Signed)
[place], [place], [address] Phone: [phone]   Fax:  [phone]  Physical Therapy Treatment  Patient Details  Name: [name] MRN: [id number] Date of Birth: December 03, 1965 Referring Provider: Jamse Arn, MD  Encounter Date: 06/12/2015      PT End of Session - 06/12/15 1019    Visit Number 5   Number of Visits 18   PT Start Time 0935   PT Stop Time 1030   PT Time Calculation (min) 55 min   Activity Tolerance Patient tolerated treatment well   Behavior During Therapy [facility] for tasks assessed/performed      Past Medical History  Diagnosis Date  . Prolonged QT interval     gene positive  . HTN (hypertension)   . Atypical chest pain     negative Myoview in 2011 with no ischemi and normal LV function.   . Obesity   . OSA (obstructive sleep apnea)     uses C-PAP  . Anxiety   . Depression   . Peripheral vascular disease ([place])     blood clot in right leg  . GERD (gastroesophageal reflux disease)     uses Nexium  . Seizures ([place])     hasn't had any for 4 years  . Arthritis     osteoarthritis  . Anemia   . DVT (deep venous thrombosis) ([place])   . PE (pulmonary embolism)     Past Surgical History  Procedure Laterality Date  . Total hip arthroplasty    . Lumbar disc surgery    . Left foot surgery    . Left hand surgery x 2    . Right hand surgery for torn ligament    . Colonoscopy    . Cardiac catheterization    . Dilation and curettage of uterus    . Anterior cervical decomp/discectomy fusion N/A 06/25/2013    Procedure: ANTERIOR CERVICAL DECOMPRESSION/DISCECTOMY FUSION 2 LEVELS LEVELS C FOUR/FIVE, SIX/SEVEN;  Surgeon: Floyce Stakes, MD;  Location: [facility];  Service: Neurosurgery;  Laterality: N/A;    There were no vitals filed for this visit.  Visit Diagnosis:  Neck pain  Weakness generalized  Midline low back pain without sciatica  Decreased range of motion of  neck  Decreased functional activity tolerance  Unsteadiness  Balance problems      Subjective Assessment - 06/12/15 0936    Subjective My neck hurts today.  I couldnt get out of bed on Tues.  due to my back pain    Currently in Pain? Yes   Pain Score 5    Pain Location Neck   Pain Orientation Posterior   Pain Type Chronic pain   Pain Onset More than a month ago   Pain Frequency Intermittent   Pain Relieving Factors stretches help   Pain Score 4   Pain Location Back   Pain Orientation Lower   Pain Type Chronic pain   Pain Onset More than a month ago   Pain Frequency Intermittent   Aggravating Factors  sleeping and lying down            [facility] PT Assessment - 06/12/15 0951    Strength   Right Shoulder Flexion 4+/5   Right Shoulder ABduction 4+/5   Left Shoulder Flexion 4+/5   Left Shoulder ABduction 4+/5   Right Elbow Flexion 5/5   Right Elbow Extension 5/5   Left Elbow Flexion 5/5   Left Elbow Extension 5/5  [place] [facility] PT Treatment/Exercise - 06/12/15 0954    Lumbar Exercises: Aerobic   Stationary Bike NuStep L 5, 62mn for warm up    Knee/Hip Exercises: Standing   SLS with Vectors abd, flexion and ext with bilat UE support, cues for keeping trunk stable, each side x 10    Shoulder Exercises: Standing   Extension Strengthening;Both;10 reps   Theraband Level (Shoulder Extension) Level 4 (Blue)   Extension Weight (lbs) cues for avoiding c   Row Strengthening;Both;20 reps   Theraband Level (Shoulder Row) Level 4 (Blue)   EAcupuncturistLocation cervical and lumbar    Electrical Stimulation Action IFC   Electrical Stimulation Parameters tol (27)   Electrical Stimulation Goals Pain   Manual Therapy   Manual Therapy Soft tissue mobilization   Soft tissue mobilization bilateral post cervicals, upper trap, mod pressure in sitting    Neck Exercises: Stretches   Upper Trapezius Stretch 2 reps;30  seconds   Other Neck Stretches rotation x 5, 30 sec                 PT Education - 06/12/15 1018    Education provided Yes   Education Details poature and neck position    Person(s) Educated Patient   Methods Explanation   Comprehension Verbalized understanding;Returned demonstration          PT Short Term Goals - 06/12/15 1021    PT SHORT TERM GOAL #1   Title Patient reports cervical pain </= 7/10 highest in 1 week period. (Target Date: 06/26/2015)   Status Achieved   PT SHORT TERM GOAL #2   Title Patient demonstrates initial HEP for cervical pain, ROM & strength.  (Target Date: 06/26/2015)   Status Achieved   PT SHORT TERM GOAL #3   Title Patient demonstrates HEP for LE strength & balance to decrease falls.  (Target Date: 06/26/2015)   Status On-going   PT SHORT TERM GOAL #4   Title Patient reports able to brush her hair intermittently raising hands to head over 5 minute period with pain increasing <3 increments on 0-10 scale.  (Target Date: 06/26/2015)   Status On-going           PT Long Term Goals - 06/12/15 1021    PT LONG TERM GOAL #1   Title Patient reports cervical pain </= 6/10 over 1 week period with ADLs.  (Target Date: 07/24/2015)   Status Partially Met   PT LONG TERM GOAL #2   Title Patient demonstrates HEP / fitness plan including cervical & LE exercises.  (Target Date: 07/24/2015)   Status On-going   PT LONG TERM GOAL #3   Title Patient self reports using FOTO 10% improvement in Neck Disability Index.  (Target Date: 07/24/2015)   Status Unable to assess   PT LONG TERM GOAL #4   Title Berg Balance >/= 45/56 to indicate lower fall risk.  (Target Date: 07/24/2015)   Status Unable to assess               Plan - 06/12/15 1019    Clinical Impression Statement Pt able to tolerate MMT today for UE, which was 4+/5 throughout. She is I with HEP, feeling less pain overall. Still has pain with extended periods of standing and when malpositioned in bed,     PT Next Visit Plan Continue progressive Nustep Progress neck HEP. [facility].   PT Home Exercise Plan C rotation, chin tuck, LTR and PPT, standing  hip vectors   Consulted and Agree with Plan of Care Patient        Problem List Patient Active Problem List   Diagnosis Date Noted  . Difficulty hearing 10/05/2014  . 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 Deep vein thrombosis of lower extremity ([place]) 10/05/2014  . Edema extremities 10/05/2014  . Esophagitis, reflux 10/05/2014  . Degenerative arthritis of hip 10/05/2014  . Depression, major, single episode, in partial remission ([place]) 10/05/2014  . Pulmonary infarction ([place]) 10/05/2014  . Idiopathic insomnia 10/05/2014  . Cervical stenosis of spinal canal 06/25/2013  . Postural dizziness 06/22/2012  . Essential hypertension 06/22/2012  . Morbid obesity ([place]) 08/17/2010  . UNSPECIFIED ENDOCRINE DISORDER 05/15/2009  . CHEST PAIN UNSPECIFIED 01/08/2009  . DEPRESSION 08/21/2008  . OSTEOPOROSIS 08/21/2008  . LONG QT SYNDROME 08/21/2008    Atiba Kimberlin 06/12/2015, 10:23 AM  [facility] [address] [place], [place], [address] Phone: [phone]   Fax:  [phone]  Name: TRENYCE LOERA MRN: [id number] Date of Birth: 01-Sep-1965    Raeford Razor, PT 06/12/2015 10:23 AM Phone: [phone] Fax: [phone]

## 2015-06-15 LAB — PATHOLOGY

## 2015-06-16 ENCOUNTER — Ambulatory Visit: Payer: Medicare Other | Admitting: Physical Therapy

## 2015-06-16 ENCOUNTER — Other Ambulatory Visit: Payer: Self-pay

## 2015-06-16 ENCOUNTER — Emergency Department (HOSPITAL_COMMUNITY): Payer: Medicare Other

## 2015-06-16 ENCOUNTER — Emergency Department (HOSPITAL_COMMUNITY)
Admission: EM | Admit: 2015-06-16 | Discharge: 2015-06-16 | Disposition: A | Discharge status: Home / Self Care | Payer: Medicare Other | Attending: Emergency Medicine | Admitting: Emergency Medicine

## 2015-06-16 ENCOUNTER — Encounter (HOSPITAL_COMMUNITY): Payer: Self-pay

## 2015-06-16 DIAGNOSIS — Z7982 Long term (current) use of aspirin: Secondary | ICD-10-CM | POA: Insufficient documentation

## 2015-06-16 DIAGNOSIS — Z86711 Personal history of pulmonary embolism: Secondary | ICD-10-CM | POA: Insufficient documentation

## 2015-06-16 DIAGNOSIS — F329 Major depressive disorder, single episode, unspecified: Secondary | ICD-10-CM | POA: Diagnosis not present

## 2015-06-16 DIAGNOSIS — D649 Anemia, unspecified: Secondary | ICD-10-CM | POA: Insufficient documentation

## 2015-06-16 DIAGNOSIS — Z86718 Personal history of other venous thrombosis and embolism: Secondary | ICD-10-CM | POA: Diagnosis not present

## 2015-06-16 DIAGNOSIS — Z9981 Dependence on supplemental oxygen: Secondary | ICD-10-CM | POA: Diagnosis not present

## 2015-06-16 DIAGNOSIS — M1990 Unspecified osteoarthritis, unspecified site: Secondary | ICD-10-CM | POA: Diagnosis not present

## 2015-06-16 DIAGNOSIS — E669 Obesity, unspecified: Secondary | ICD-10-CM | POA: Insufficient documentation

## 2015-06-16 DIAGNOSIS — Z79899 Other long term (current) drug therapy: Secondary | ICD-10-CM | POA: Insufficient documentation

## 2015-06-16 DIAGNOSIS — R0682 Tachypnea, not elsewhere classified: Secondary | ICD-10-CM | POA: Insufficient documentation

## 2015-06-16 DIAGNOSIS — Z9889 Other specified postprocedural states: Secondary | ICD-10-CM | POA: Insufficient documentation

## 2015-06-16 DIAGNOSIS — Z791 Long term (current) use of non-steroidal anti-inflammatories (NSAID): Secondary | ICD-10-CM | POA: Insufficient documentation

## 2015-06-16 DIAGNOSIS — F419 Anxiety disorder, unspecified: Secondary | ICD-10-CM | POA: Diagnosis not present

## 2015-06-16 DIAGNOSIS — J40 Bronchitis, not specified as acute or chronic: Secondary | ICD-10-CM | POA: Diagnosis not present

## 2015-06-16 DIAGNOSIS — Z87891 Personal history of nicotine dependence: Secondary | ICD-10-CM | POA: Diagnosis not present

## 2015-06-16 DIAGNOSIS — K219 Gastro-esophageal reflux disease without esophagitis: Secondary | ICD-10-CM | POA: Diagnosis not present

## 2015-06-16 DIAGNOSIS — I10 Essential (primary) hypertension: Secondary | ICD-10-CM | POA: Insufficient documentation

## 2015-06-16 DIAGNOSIS — G4733 Obstructive sleep apnea (adult) (pediatric): Secondary | ICD-10-CM | POA: Insufficient documentation

## 2015-06-16 DIAGNOSIS — R1013 Epigastric pain: Secondary | ICD-10-CM

## 2015-06-16 DIAGNOSIS — Z7901 Long term (current) use of anticoagulants: Secondary | ICD-10-CM | POA: Diagnosis not present

## 2015-06-16 LAB — HEPATIC FUNCTION PANEL
ALT: 17 U/L (ref 14–54)
AST: 20 U/L (ref 15–41)
Albumin: 3.3 g/dL — ABNORMAL LOW (ref 3.5–5.0)
Alkaline Phosphatase: 59 U/L (ref 38–126)
BILIRUBIN TOTAL: 0.6 mg/dL (ref 0.3–1.2)
Total Protein: 6.7 g/dL (ref 6.5–8.1)

## 2015-06-16 LAB — CBC
HEMATOCRIT: 37.2 % (ref 36.0–46.0)
HEMOGLOBIN: 11.9 g/dL — AB (ref 12.0–15.0)
MCH: 28.5 pg (ref 26.0–34.0)
MCHC: 32 g/dL (ref 30.0–36.0)
MCV: 89 fL (ref 78.0–100.0)
Platelets: 249 10*3/uL (ref 150–400)
RBC: 4.18 MIL/uL (ref 3.87–5.11)
RDW: 13.2 % (ref 11.5–15.5)
WBC: 5.6 10*3/uL (ref 4.0–10.5)

## 2015-06-16 LAB — I-STAT TROPONIN, ED: Troponin i, poc: 0 ng/mL (ref 0.00–0.08)

## 2015-06-16 LAB — BASIC METABOLIC PANEL
ANION GAP: 9 (ref 5–15)
BUN: 17 mg/dL (ref 6–20)
CALCIUM: 9.3 mg/dL (ref 8.9–10.3)
CO2: 26 mmol/L (ref 22–32)
Chloride: 106 mmol/L (ref 101–111)
Creatinine, Ser: 0.88 mg/dL (ref 0.44–1.00)
GFR calc Af Amer: >60 mL/min (ref >60)
GFR calc non Af Amer: >60 mL/min (ref >60)
GLUCOSE: 108 mg/dL — AB (ref 65–99)
POTASSIUM: 4.1 mmol/L (ref 3.5–5.1)
Sodium: 141 mmol/L (ref 135–145)

## 2015-06-16 MED ORDER — ESOMEPRAZOLE MAGNESIUM 40 MG PO CPDR: 40.0000 mg | DELAYED_RELEASE_CAPSULE | Freq: Every day | ORAL | Status: DC

## 2015-06-16 MED ORDER — GI COCKTAIL ~~LOC~~
30.0000 mL | Freq: Once | ORAL | Status: AC
  Administered 2015-06-16: 30 mL via ORAL
  Filled 2015-06-16: qty 30

## 2015-06-16 NOTE — ED Provider Notes (Addendum)
CSN: [id number]     Arrival date & time 06/16/15  1422 History   First MD Initiated Contact with Patient 06/16/15 1546     Chief Complaint  Patient presents with  . epigastric pain      (Consider location/radiation/quality/duration/timing/severity/associated sxs/prior Treatment) The history is provided by the patient.   50 year old female comes in complaining of epigastric pain. Pain is sharp when without radiation. It has been present for years but has been getting worse over the last 3 months and was much worse today. Pain is intermittent and, when present, she rates it at 10/10. It is worse when she bends over and better if she lays down. Food does not seem to affect it. There is no associated nausea or vomiting. She denies any change in her bowel pattern. Of note, she takes daily esomeprazole, and has been under care of a gastroenterologist because of colon polyps. She has had an upper endoscopy in 2014.  Past Medical History  Diagnosis Date  . Prolonged QT interval     gene positive  . HTN (hypertension)   . Atypical chest pain     negative Myoview in 2011 with no ischemi and normal LV function.   . Obesity   . OSA (obstructive sleep apnea)     uses C-PAP  . Anxiety   . Depression   . Peripheral vascular disease ([place])     blood clot in right leg  . GERD (gastroesophageal reflux disease)     uses Nexium  . Seizures ([place])     hasn't had any for 4 years  . Arthritis     osteoarthritis  . Anemia   . DVT (deep venous thrombosis) ([place])   . PE (pulmonary embolism)    Past Surgical History  Procedure Laterality Date  . Total hip arthroplasty    . Lumbar disc surgery    . Left foot surgery    . Left hand surgery x 2    . Right hand surgery for torn ligament    . Colonoscopy    . Cardiac catheterization    . Dilation and curettage of uterus    . Anterior cervical decomp/discectomy fusion N/A 06/25/2013    Procedure: ANTERIOR CERVICAL DECOMPRESSION/DISCECTOMY FUSION 2 LEVELS  LEVELS C FOUR/FIVE, SIX/SEVEN;  Surgeon: Floyce Stakes, MD;  Location: [facility];  Service: Neurosurgery;  Laterality: N/A;   Family History  Problem Relation Age of Onset  . Long QT syndrome Mother   . Diabetes Father   . Diabetes Brother   . CAD Father   . CAD Mother    Social History  Substance Use Topics  . Smoking status: Former Smoker    Quit date: 08/16/2001  . Smokeless tobacco: Never Used  . Alcohol Use: 1.2 oz/week    2 Standard drinks or equivalent per week   OB History    No data available     Review of Systems  All other systems reviewed and are negative.     Allergies  Review of patient's allergies indicates no known allergies.  Home Medications   Prior to Admission medications   Medication Sig Start Date End Date Taking? Authorizing Provider  albuterol (PROVENTIL HFA;VENTOLIN HFA) 108 (90 BASE) MCG/ACT inhaler Inhale 1-2 puffs into the lungs every 4 (four) hours as needed for wheezing or shortness of breath.    Historical Provider, MD  amitriptyline (ELAVIL) 150 MG tablet Take 1 tablet (150 mg total) by mouth at bedtime. 04/30/15   Ankit Maurene Capes  Posey Pronto, MD  apixaban (ELIQUIS) 5 MG TABS tablet Take 1 tablet by mouth 2 (two) times daily. Reported on 04/30/2015 11/07/13   Historical Provider, MD  aspirin 81 MG tablet Take 81 mg by mouth daily.      Historical Provider, MD  budesonide-formoterol (SYMBICORT) 160-4.5 MCG/ACT inhaler Inhale 2 puffs into the lungs 2 (two) times daily as needed (for shortness of breath).    Historical Provider, MD  escitalopram (LEXAPRO) 20 MG tablet Take 20 mg by mouth daily.  10/03/13   Historical Provider, MD  esomeprazole (NEXIUM) 40 MG capsule Take 40 mg by mouth daily at 12 noon.    Historical Provider, MD  Ferrous Sulfate (SLOW RELEASE IRON PO) Take 1 tablet by mouth daily.     Historical Provider, MD  fluocinonide (LIDEX) 0.05 % external solution Apply 1 application topically 2 (two) times daily. 12/30/11   Historical Provider,  MD  furosemide (LASIX) 20 MG tablet TAKE (1) TABLET BY MOUTH EVERY DAY 10/31/14   Glean Hess, MD  Glucosamine-Chondroit-Vit C-Mn (GLUCOSAMINE 1500 COMPLEX) CAPS Take 1 each by mouth daily.    Historical Provider, MD  hyoscyamine (LEVSIN SL) 0.125 MG SL tablet Take 1 tablet by mouth 2 (two) times daily as needed. For nausea 04/03/14   Historical Provider, MD  labetalol (NORMODYNE) 200 MG tablet Take 1 tablet (200 mg total) by mouth 2 (two) times daily. 08/07/14   Deboraha Sprang, MD  lubiprostone (AMITIZA) 8 MCG capsule Take 1 capsule by mouth daily. Reported on 04/30/2015    Historical Provider, MD  methocarbamol (ROBAXIN) 500 MG tablet Take 1 tablet (500 mg total) by mouth every 8 (eight) hours as needed for muscle spasms. 04/30/15   Ankit Lorie Phenix, MD  Multiple Vitamins-Minerals (MULTIVITAMIN WITH MINERALS) tablet Take 1 tablet by mouth daily.    Historical Provider, MD  nabumetone (RELAFEN) 750 MG tablet Take 750 mg by mouth 2 (two) times daily.  11/07/13   Historical Provider, MD  Omega-3 Fatty Acids (FISH OIL) 1000 MG CAPS Take 1 capsule by mouth 3 (three) times daily.      Historical Provider, MD  Oxycodone HCl 10 MG TABS TAKE 1 TABLET BY MOUTH EVERY 6 - 8 HOURS AS NEEDED 04/03/15   Historical Provider, MD  potassium chloride SA (K-DUR,KLOR-CON) 20 MEQ tablet Take 1 tablet (20 mEq total) by mouth daily. 04/08/14   Thayer Headings, MD  spironolactone (ALDACTONE) 25 MG tablet Take 1 tablet (25 mg total) by mouth daily. 05/08/14   Deboraha Sprang, MD  triamterene-hydrochlorothiazide (MAXZIDE) 75-50 MG per tablet Take 1 tablet by mouth daily. 04/07/14   Historical Provider, MD  VOLTAREN 1 % GEL Apply 2 g topically 4 (four) times daily as needed (for pain).  10/31/13   Historical Provider, MD   BP 105/67 mmHg  Pulse 68  Temp(Src) 97.6 F (36.4 C) (Oral)  Resp 22  Ht 5\' 5"  (1.651 m)  Wt 275 lb (124.739 kg)  BMI 45.76 kg/m2  SpO2 95%  LMP 05/29/2015 (Exact Date) Physical Exam  Nursing note and  vitals reviewed.  50 year old female, resting comfortably and in no acute distress. Vital signs are significant for mild tachypnea. Oxygen saturation is 95%, which is normal. Head is normocephalic and atraumatic. PERRLA, EOMI. Oropharynx is clear. Neck is nontender and supple without adenopathy or JVD. Back is nontender and there is no CVA tenderness. Lungs are clear without rales, wheezes, or rhonchi. Chest is nontender. Heart has regular rate and rhythm  without murmur. Abdomen is soft, flat, with moderate epigastric tenderness and mild right upper quadrant tenderness with plus/minus Murphy sign. There are no masses or hepatosplenomegaly and peristalsis is normoactive. Extremities have no cyanosis or edema, full range of motion is present. Skin is warm and dry without rash. Neurologic: Mental status is normal, cranial nerves are intact, there are no motor or sensory deficits.  ED Course  Procedures (including critical care time) Labs Review Results for orders placed or performed during the hospital encounter of [id number]  Basic metabolic panel  Result Value Ref Range   Sodium 141 135 - 145 mmol/L   Potassium 4.1 3.5 - 5.1 mmol/L   Chloride 106 101 - 111 mmol/L   CO2 26 22 - 32 mmol/L   Glucose, Bld 108 (H) 65 - 99 mg/dL   BUN 17 6 - 20 mg/dL   Creatinine, Ser 0.88 0.44 - 1.00 mg/dL   Calcium 9.3 8.9 - 10.3 mg/dL   GFR calc non Af Amer >60 >60 mL/min   GFR calc Af Amer >60 >60 mL/min   Anion gap 9 5 - 15  CBC  Result Value Ref Range   WBC 5.6 4.0 - 10.5 K/uL   RBC 4.18 3.87 - 5.11 MIL/uL   Hemoglobin 11.9 (L) 12.0 - 15.0 g/dL   HCT 37.2 36.0 - 46.0 %   MCV 89.0 78.0 - 100.0 fL   MCH 28.5 26.0 - 34.0 pg   MCHC 32.0 30.0 - 36.0 g/dL   RDW 13.2 11.5 - 15.5 %   Platelets 249 150 - 400 K/uL  Hepatic function panel  Result Value Ref Range   Total Protein 6.7 6.5 - 8.1 g/dL   Albumin 3.3 (L) 3.5 - 5.0 g/dL   AST 20 15 - 41 U/L   ALT 17 14 - 54 U/L   Alkaline Phosphatase 59 38  - 126 U/L   Total Bilirubin 0.6 0.3 - 1.2 mg/dL   Bilirubin, Direct <0.1 (L) 0.1 - 0.5 mg/dL   Indirect Bilirubin NOT CALCULATED 0.3 - 0.9 mg/dL  I-stat troponin, ED (not at [facility])  Result Value Ref Range   Troponin i, poc 0.00 0.00 - 0.08 ng/mL   Comment 3           Imaging Review Dg Chest 2 View  06/16/2015  CLINICAL DATA:  Severe epigastric pain beginning today EXAM: CHEST  2 VIEW COMPARISON:  03/09/2014 FINDINGS: Normal heart size, mediastinal contours and pulmonary vascularity normal. Mild bronchitic changes. Lungs clear. No pleural effusion or pneumothorax. Prior spinal fusion. No free air under the diaphragms. IMPRESSION: Mild bronchitic changes without infiltrate. Electronically Signed   By: Lavonia Dana M.D.   On: 06/16/2015 15:33   US Abdomen Complete  06/16/2015  CLINICAL DATA:  50 year old female with epigastric pain EXAM: ABDOMEN ULTRASOUND COMPLETE COMPARISON:  Prior abdominal ultrasound 11/12/2008 FINDINGS: Gallbladder: No gallstones or wall thickening visualized. No sonographic Murphy sign noted by sonographer. Common bile duct: Diameter: Within normal limits at 2 mm Liver: No focal lesion identified. Within normal limits in parenchymal echogenicity. IVC: No abnormality visualized. Pancreas: Visualized portion unremarkable. Spleen: Size and appearance within normal limits. Right Kidney: Length: 11.8 cm. Echogenicity within normal limits. No mass or hydronephrosis visualized. Left Kidney: Length: 11.8 cm. Echogenicity within normal limits. No mass or hydronephrosis visualized. Abdominal aorta: No aneurysm visualized. Other findings: None. IMPRESSION: Unremarkable abdominal ultrasound. Electronically Signed   By: Jacqulynn Cadet M.D.   On: 06/16/2015 17:04   I have personally  reviewed and evaluated these images and lab results as part of my medical decision-making.   EKG Interpretation   Date/Time:  Tuesday June 16 2015 14:27:30 EST Ventricular Rate:  70 PR Interval:   102 QRS Duration: 80 QT Interval:  434 QTC Calculation: 468 R Axis:   31 Text Interpretation:  Sinus rhythm with short PR Cannot rule out Anterior  infarct , age undetermined Abnormal ECG Nonspecific T wave abnormality  When compared with ECG of 03/09/2014, QT has shortened Confirmed by [facility]   MD, Aliah Eriksson ([id number]) on 06/16/2015 3:57:46 PM      MDM   Final diagnoses:  Epigastric pain  GERD without esophagitis  Long term (current) use of anticoagulants    Epigastric pain of uncertain cause. Old records are reviewed including records on care everywhere. She had colonoscopy and upper endoscopy in 2014 and is scheduled to have colonoscopies every 5 years. She had a negative abdominal ultrasound in 2014. Screening labs today are normal, but do not include liver enzymes and these will be checked. I will send her for a gallbladder ultrasound, but I doubt this will show gallstones since she did have an ultrasound 3 years ago which showed no gallstones. Anticipate sending her home with increase her esomeprazole to twice a day and follow-up with her gastroenterologist.  She got good relief from a GI cocktail. Hepatic function panel was normal and abdominal ultrasound is unremarkable. She is discharged with instructions to increase esoomeprazole to twice a day for 2 weeks and see her gastroenterologist. Use antacids as needed.  Delora Fuel, MD [id number]  Delora Fuel, MD [id number]

## 2015-06-16 NOTE — ED Notes (Signed)
Pt verbalizes understanding of instruction. 

## 2015-06-16 NOTE — ED Notes (Signed)
Pt returns from [place] and states chest pain free and abd pain 7/10.

## 2015-06-16 NOTE — Discharge Instructions (Signed)
Increase your Nexium to twice a day for the next two weeks. Use antacids as needed. See your gastroenterologist as soon as possible.   Gastroesophageal Reflux Disease, Adult Normally, food travels down the esophagus and stays in the stomach to be digested. However, when a person has gastroesophageal reflux disease (GERD), food and stomach acid move back up into the esophagus. When this happens, the esophagus becomes sore and inflamed. Over time, GERD can create small holes (ulcers) in the lining of the esophagus.  CAUSES This condition is caused by a problem with the muscle between the esophagus and the stomach (lower esophageal sphincter, or LES). Normally, the LES muscle closes after food passes through the esophagus to the stomach. When the LES is weakened or abnormal, it does not close properly, and that allows food and stomach acid to go back up into the esophagus. The LES can be weakened by certain dietary substances, medicines, and medical conditions, including:  Tobacco use.  Pregnancy.  Having a hiatal hernia.  Heavy alcohol use.  Certain foods and beverages, such as coffee, chocolate, onions, and peppermint. RISK FACTORS This condition is more likely to develop in:  People who have an increased body weight.  People who have connective tissue disorders.  People who use NSAID medicines. SYMPTOMS Symptoms of this condition include:  Heartburn.  Difficult or painful swallowing.  The feeling of having a lump in the throat.  Abitter taste in the mouth.  Bad breath.  Having a large amount of saliva.  Having an upset or bloated stomach.  Belching.  Chest pain.  Shortness of breath or wheezing.  Ongoing (chronic) cough or a night-time cough.  Wearing away of tooth enamel.  Weight loss. Different conditions can cause chest pain. Make sure to see your health care provider if you experience chest pain. DIAGNOSIS Your health care provider will take a medical  history and perform a physical exam. To determine if you have mild or severe GERD, your health care provider may also monitor how you respond to treatment. You may also have other tests, including:  An endoscopy toexamine your stomach and esophagus with a small camera.  A test thatmeasures the acidity level in your esophagus.  A test thatmeasures how much pressure is on your esophagus.  A barium swallow or modified barium swallow to show the shape, size, and functioning of your esophagus. TREATMENT The goal of treatment is to help relieve your symptoms and to prevent complications. Treatment for this condition may vary depending on how severe your symptoms are. Your health care provider may recommend:  Changes to your diet.  Medicine.  Surgery. HOME CARE INSTRUCTIONS Diet  Follow a diet as recommended by your health care provider. This may involve avoiding foods and drinks such as:  Coffee and tea (with or without caffeine).  Drinks that containalcohol.  Energy drinks and sports drinks.  Carbonated drinks or sodas.  Chocolate and cocoa.  Peppermint and mint flavorings.  Garlic and onions.  Horseradish.  Spicy and acidic foods, including peppers, chili powder, curry powder, vinegar, hot sauces, and barbecue sauce.  Citrus fruit juices and citrus fruits, such as oranges, lemons, and limes.  Tomato-based foods, such as red sauce, chili, salsa, and pizza with red sauce.  Fried and fatty foods, such as donuts, french fries, potato chips, and high-fat dressings.  High-fat meats, such as hot dogs and fatty cuts of red and white meats, such as rib eye steak, sausage, ham, and bacon.  High-fat dairy items, such  as whole milk, butter, and cream cheese.  Eat small, frequent meals instead of large meals.  Avoid drinking large amounts of liquid with your meals.  Avoid eating meals during the 2-3 hours before bedtime.  Avoid lying down right after you eat.  Do not  exercise right after you eat. General Instructions  Pay attention to any changes in your symptoms.  Take over-the-counter and prescription medicines only as told by your health care provider. Do not take aspirin, ibuprofen, or other NSAIDs unless your health care provider told you to do so.  Do not use any tobacco products, including cigarettes, chewing tobacco, and e-cigarettes. If you need help quitting, ask your health care provider.  Wear loose-fitting clothing. Do not wear anything tight around your waist that causes pressure on your abdomen.  Raise (elevate) the head of your bed 6 inches (15cm).  Try to reduce your stress, such as with yoga or meditation. If you need help reducing stress, ask your health care provider.  If you are overweight, reduce your weight to an amount that is healthy for you. Ask your health care provider for guidance about a safe weight loss goal.  Keep all follow-up visits as told by your health care provider. This is important. SEEK MEDICAL CARE IF:  You have new symptoms.  You have unexplained weight loss.  You have difficulty swallowing, or it hurts to swallow.  You have wheezing or a persistent cough.  Your symptoms do not improve with treatment.  You have a hoarse voice. SEEK IMMEDIATE MEDICAL CARE IF:  You have pain in your arms, neck, jaw, teeth, or back.  You feel sweaty, dizzy, or light-headed.  You have chest pain or shortness of breath.  You vomit and your vomit looks like blood or coffee grounds.  You faint.  Your stool is bloody or black.  You cannot swallow, drink, or eat.   This information is not intended to replace advice given to you by your health care provider. Make sure you discuss any questions you have with your health care provider.   Document Released: 01/26/2005 Document Revised: 01/07/2015 Document Reviewed: 08/13/2014 Elsevier Interactive Patient Education [employer].

## 2015-06-16 NOTE — ED Notes (Signed)
Patient here with epigastric pain that has ben going on for months, reports pain worse with movement and in sitting position.

## 2015-06-19 ENCOUNTER — Ambulatory Visit: Payer: Medicare Other | Admitting: Physical Therapy

## 2015-06-23 ENCOUNTER — Encounter: Payer: Medicare Other | Admitting: Physical Therapy

## 2015-06-25 ENCOUNTER — Ambulatory Visit: Payer: Medicare Other | Admitting: Physical Therapy

## 2015-06-30 ENCOUNTER — Ambulatory Visit: Payer: Medicare Other | Admitting: Physical Therapy

## 2015-06-30 DIAGNOSIS — K5900 Constipation, unspecified: Secondary | ICD-10-CM | POA: Diagnosis not present

## 2015-06-30 DIAGNOSIS — K219 Gastro-esophageal reflux disease without esophagitis: Secondary | ICD-10-CM | POA: Diagnosis not present

## 2015-06-30 DIAGNOSIS — R1013 Epigastric pain: Secondary | ICD-10-CM | POA: Diagnosis not present

## 2015-07-01 ENCOUNTER — Other Ambulatory Visit: Payer: Self-pay | Admitting: *Deleted

## 2015-07-01 ENCOUNTER — Encounter: Payer: Medicare Other | Admitting: Physical Medicine & Rehabilitation

## 2015-07-01 DIAGNOSIS — M792 Neuralgia and neuritis, unspecified: Secondary | ICD-10-CM

## 2015-07-01 DIAGNOSIS — M6289 Other specified disorders of muscle: Secondary | ICD-10-CM

## 2015-07-01 DIAGNOSIS — G4700 Insomnia, unspecified: Secondary | ICD-10-CM

## 2015-07-01 DIAGNOSIS — G894 Chronic pain syndrome: Secondary | ICD-10-CM

## 2015-07-01 MED ORDER — METHOCARBAMOL 500 MG PO TABS: 500.0000 mg | ORAL_TABLET | Freq: Three times a day (TID) | ORAL | Status: DC | PRN

## 2015-07-01 MED ORDER — AMITRIPTYLINE HCL 150 MG PO TABS
150.0000 mg | ORAL_TABLET | Freq: Every day | ORAL | Status: DC
Start: 2015-07-01 — End: 2015-07-02

## 2015-07-02 ENCOUNTER — Encounter: Payer: Medicare Other | Attending: Physical Medicine & Rehabilitation | Admitting: Physical Medicine & Rehabilitation

## 2015-07-02 ENCOUNTER — Telehealth: Payer: Self-pay

## 2015-07-02 ENCOUNTER — Encounter: Payer: Self-pay | Admitting: Physical Medicine & Rehabilitation

## 2015-07-02 VITALS — BP 129/72 | HR 68 | Resp 14

## 2015-07-02 DIAGNOSIS — Z86711 Personal history of pulmonary embolism: Secondary | ICD-10-CM | POA: Insufficient documentation

## 2015-07-02 DIAGNOSIS — Z7982 Long term (current) use of aspirin: Secondary | ICD-10-CM | POA: Diagnosis not present

## 2015-07-02 DIAGNOSIS — Z6841 Body Mass Index (BMI) 40.0 and over, adult: Secondary | ICD-10-CM | POA: Insufficient documentation

## 2015-07-02 DIAGNOSIS — R269 Unspecified abnormalities of gait and mobility: Secondary | ICD-10-CM

## 2015-07-02 DIAGNOSIS — G4700 Insomnia, unspecified: Secondary | ICD-10-CM | POA: Diagnosis not present

## 2015-07-02 DIAGNOSIS — Z87891 Personal history of nicotine dependence: Secondary | ICD-10-CM | POA: Diagnosis not present

## 2015-07-02 DIAGNOSIS — K219 Gastro-esophageal reflux disease without esophagitis: Secondary | ICD-10-CM | POA: Insufficient documentation

## 2015-07-02 DIAGNOSIS — G729 Myopathy, unspecified: Secondary | ICD-10-CM

## 2015-07-02 DIAGNOSIS — I10 Essential (primary) hypertension: Secondary | ICD-10-CM | POA: Insufficient documentation

## 2015-07-02 DIAGNOSIS — G894 Chronic pain syndrome: Secondary | ICD-10-CM

## 2015-07-02 DIAGNOSIS — R42 Dizziness and giddiness: Secondary | ICD-10-CM | POA: Diagnosis not present

## 2015-07-02 DIAGNOSIS — Z9181 History of falling: Secondary | ICD-10-CM | POA: Diagnosis not present

## 2015-07-02 DIAGNOSIS — Z79899 Other long term (current) drug therapy: Secondary | ICD-10-CM | POA: Insufficient documentation

## 2015-07-02 DIAGNOSIS — R0789 Other chest pain: Secondary | ICD-10-CM | POA: Diagnosis not present

## 2015-07-02 DIAGNOSIS — F329 Major depressive disorder, single episode, unspecified: Secondary | ICD-10-CM | POA: Diagnosis not present

## 2015-07-02 DIAGNOSIS — M792 Neuralgia and neuritis, unspecified: Secondary | ICD-10-CM | POA: Insufficient documentation

## 2015-07-02 DIAGNOSIS — G4733 Obstructive sleep apnea (adult) (pediatric): Secondary | ICD-10-CM | POA: Insufficient documentation

## 2015-07-02 DIAGNOSIS — F419 Anxiety disorder, unspecified: Secondary | ICD-10-CM | POA: Insufficient documentation

## 2015-07-02 DIAGNOSIS — I739 Peripheral vascular disease, unspecified: Secondary | ICD-10-CM | POA: Diagnosis not present

## 2015-07-02 DIAGNOSIS — Z8249 Family history of ischemic heart disease and other diseases of the circulatory system: Secondary | ICD-10-CM | POA: Insufficient documentation

## 2015-07-02 DIAGNOSIS — Z7901 Long term (current) use of anticoagulants: Secondary | ICD-10-CM | POA: Diagnosis not present

## 2015-07-02 DIAGNOSIS — M6289 Other specified disorders of muscle: Secondary | ICD-10-CM

## 2015-07-02 MED ORDER — AMITRIPTYLINE HCL 150 MG PO TABS: 75.0000 mg | ORAL_TABLET | Freq: Every day | ORAL | Status: DC

## 2015-07-02 NOTE — Telephone Encounter (Signed)
Pt requesting rx for cane faxed to [facility]. Rx has been faxed. Pt is aware.

## 2015-07-02 NOTE — Addendum Note (Signed)
Addended by: Delice Lesch A on: 07/02/2015 12:21 PM   Modules accepted: Orders

## 2015-07-02 NOTE — Progress Notes (Signed)
Subjective:    Patient ID: [name], female    DOB: [birth year], 50 y.o.   MRN: [id number]  HPI 50 y/o female with pmh HTN, depression, anxiety, seizures presents for follow up of neck pain.  Pt had lumbar surgery in 2009 and neck surgery in 2014.  Since that time the pt has been having in pain in her left neck (near the incision site).  She has associated cramping.   ROM of left should exacerbates the pain. Laying in pt's right side improves the pain.  The pain is sharp.  It is constant.  It occasionally radiates down her dorsal arm.  Severity is 5/10 today.  The pt is on disability.   Pt was last seen in clinic on 04/30/15.  At that time she was referred to PT, massage therapy, TENS, and breast reduction surgery.  She was ordered elavil for pain and insomnia.   Pt went to PT a couple of times, but has had GI issues recently and has not been able to go recently.  When she was able to go it was helping her.  Massage therapy and TENS unit improve the pain.  She states she never received a call regarding breast reduction surgery.  The elavil makes her sleepy and restless.  Pain Inventory Average Pain 9 Pain Right Now 5 My pain is NA  In the last 24 hours, has pain interfered with the following? General activity 9 Relation with others 7 Enjoyment of life 7 What TIME of day is your pain at its worst? morning, night Sleep (in general) NA  Pain is worse with: NA Pain improves with: NA Relief from Meds: 5  Mobility walk with assistance how many minutes can you walk? 3 ability to climb steps?  no do you drive?  yes Do you have any goals in this area?  yes  Function disabled: date disabled NA  Neuro/Psych weakness numbness tingling trouble walking spasms dizziness anxiety  Prior Studies Any changes since last visit?  no  Physicians involved in your care Any changes since last visit?  no   Family History  Problem Relation Age of Onset  . Long QT syndrome Mother     . Diabetes Father   . Diabetes Brother   . CAD Father   . CAD Mother    Social History   Social History  . Marital Status: Single    Spouse Name: N/A  . Number of Children: N/A  . Years of Education: N/A   Occupational History  . disabled    Social History Main Topics  . Smoking status: Former Smoker    Quit date: 08/16/2001  . Smokeless tobacco: Never Used  . Alcohol Use: 1.2 oz/week    2 Standard drinks or equivalent per week  . Drug Use: No  . Sexual Activity: No   Other Topics Concern  . None   Social History Narrative   Past Surgical History  Procedure Laterality Date  . Total hip arthroplasty    . Lumbar disc surgery    . Left foot surgery    . Left hand surgery x 2    . Right hand surgery for torn ligament    . Colonoscopy    . Cardiac catheterization    . Dilation and curettage of uterus    . Anterior cervical decomp/discectomy fusion N/A 06/25/2013    Procedure: ANTERIOR CERVICAL DECOMPRESSION/DISCECTOMY FUSION 2 LEVELS LEVELS C FOUR/FIVE, SIX/SEVEN;  Surgeon: Floyce Stakes, MD;  Location: [facility]  [facility]UR[facility] ORS;  Service: Neurosurgery;  Laterality: N/A;   Past Medical History  Diagnosis Date  . Prolonged QT interval     gene positive  . HTN (hypertension)   . Atypical chest pain     negative Myoview in 2011 with no ischemi and normal LV function.   . Obesity   . OSA (obstructive sleep apnea)     uses C-PAP  . Anxiety   . Depression   . Peripheral vascular disease ([place])     blood clot in right leg  . GERD (gastroesophageal reflux disease)     uses Nexium  . Seizures ([place])     hasn't had any for 4 years  . Arthritis     osteoarthritis  . Anemia   . DVT (deep venous thrombosis) ([place])   . PE (pulmonary embolism)    BP 129/72 mmHg  Pulse 68  Resp 14  SpO2 97%  LMP 05/29/2015 (Exact Date)  Opioid Risk Score:   Fall Risk Score:  `1  Depression screen PHQ 2/9  Depression screen PHQ 2/9 04/30/2015  Decreased Interest 1  Down, Depressed,  Hopeless 0  PHQ - 2 Score 1     Review of Systems  Respiratory: Positive for apnea and shortness of breath.   Gastrointestinal: Positive for nausea.  Musculoskeletal: Positive for gait problem.  Neurological: Positive for dizziness, weakness and numbness.       Tingling  Spasms   Psychiatric/Behavioral: The patient is nervous/anxious.   All other systems reviewed and are negative.     Objective:   Physical Exam Gen: NAD. Vita signs reviewed. Obese HENT: Normocephalic. Surgical scar on left anterior neck. Eyes: EOMI, Conj WNL Cardio: S1, S2 normal, RRR Pulm: B/l clear to auscultation.  Effort normal Abd: Soft, non-distended, non-tender, BS+ MSK:   Gait Antalgic.               No gross TTP in UE, neck, upper back.                No edema.               Neg Spurling's b/l.             Decreased ROM in neck on left lateral deviation secondary to pain. Neuro: CN II-XII grossly intact.                Sensation intact to light touch in all UE dermatomes             Reflexes 2+ throughout             Strength          5/5 in all UE myotomes Skin: Surgical scar left anterior neck     Assessment & Plan:  50 y/o female with pmh HTN, depression, anxiety, seizures presents for follow of neck pain.  1. Chronic pain syndrome - Most pronounced in left anterior neck along surgical site             Cont PT - Providing good relief, however, unable to attend recently due to GI issues             Cont massage therapy             Cont TENS             D/c robaxin as pt is not finding any benefit.              Will reduce elavil to  75mg              Will refer again for evaluation for breast reduction surgery  Will consider Biowave in future due to success of TENS              2. Muscle stiffness            Cont PT - Providing good relief, however, unable to attend recently due to GI issues          Cont massage therapy             Will refer again for evaluation for breast reduction  surgery  3. Neuropathic pain             Cont TENS             Will reduce elavil to 75mg   4. Insomnia with hx of OSA             Will reduce elavil to 75mg              Cont CPAP  5. Morbid obesity             Encouraged weight loss             Cont PT  Will refer again for breast reduction surgery  6. Falls             Will provide prescription for quad cane             Encouraged use of cane

## 2015-07-03 ENCOUNTER — Ambulatory Visit: Payer: Medicare Other

## 2015-07-03 ENCOUNTER — Ambulatory Visit: Payer: Medicare Other | Attending: Physical Medicine & Rehabilitation | Admitting: Physical Therapy

## 2015-07-03 DIAGNOSIS — M545 Low back pain: Secondary | ICD-10-CM | POA: Insufficient documentation

## 2015-07-03 DIAGNOSIS — M542 Cervicalgia: Secondary | ICD-10-CM | POA: Insufficient documentation

## 2015-07-03 DIAGNOSIS — R29898 Other symptoms and signs involving the musculoskeletal system: Secondary | ICD-10-CM | POA: Insufficient documentation

## 2015-07-03 DIAGNOSIS — R29818 Other symptoms and signs involving the nervous system: Secondary | ICD-10-CM | POA: Insufficient documentation

## 2015-07-03 DIAGNOSIS — R531 Weakness: Secondary | ICD-10-CM | POA: Insufficient documentation

## 2015-07-03 DIAGNOSIS — R2681 Unsteadiness on feet: Secondary | ICD-10-CM | POA: Insufficient documentation

## 2015-07-03 DIAGNOSIS — R6889 Other general symptoms and signs: Secondary | ICD-10-CM | POA: Insufficient documentation

## 2015-07-06 ENCOUNTER — Other Ambulatory Visit: Payer: Self-pay

## 2015-07-06 MED ORDER — NABUMETONE 750 MG PO TABS: 750.0000 mg | ORAL_TABLET | Freq: Two times a day (BID) | ORAL | Status: DC

## 2015-07-06 NOTE — Telephone Encounter (Signed)
Received fax from pharmacy requesting refills.

## 2015-07-07 ENCOUNTER — Ambulatory Visit
Admission: RE | Admit: 2015-07-07 | Discharge: 2015-07-07 | Disposition: A | Discharge status: Home / Self Care | Payer: Medicare Other | Source: Ambulatory Visit | Attending: Nurse Practitioner | Admitting: Nurse Practitioner

## 2015-07-07 ENCOUNTER — Ambulatory Visit: Payer: Medicare Other | Admitting: Physical Therapy

## 2015-07-07 DIAGNOSIS — R6889 Other general symptoms and signs: Secondary | ICD-10-CM

## 2015-07-07 DIAGNOSIS — R29898 Other symptoms and signs involving the musculoskeletal system: Secondary | ICD-10-CM | POA: Diagnosis not present

## 2015-07-07 DIAGNOSIS — Z1231 Encounter for screening mammogram for malignant neoplasm of breast: Secondary | ICD-10-CM | POA: Insufficient documentation

## 2015-07-07 DIAGNOSIS — R29818 Other symptoms and signs involving the nervous system: Secondary | ICD-10-CM | POA: Diagnosis not present

## 2015-07-07 DIAGNOSIS — M542 Cervicalgia: Secondary | ICD-10-CM | POA: Diagnosis not present

## 2015-07-07 DIAGNOSIS — R531 Weakness: Secondary | ICD-10-CM | POA: Diagnosis not present

## 2015-07-07 DIAGNOSIS — R2681 Unsteadiness on feet: Secondary | ICD-10-CM

## 2015-07-07 DIAGNOSIS — M545 Low back pain: Secondary | ICD-10-CM | POA: Diagnosis not present

## 2015-07-07 DIAGNOSIS — R2689 Other abnormalities of gait and mobility: Secondary | ICD-10-CM

## 2015-07-07 NOTE — Patient Instructions (Signed)
Over Head Pull: Narrow Grip       On back, knees bent, feet flat, band across thighs, elbows straight but relaxed. Pull hands apart (start). Keeping elbows straight, bring arms up and over head, hands toward floor. Keep pull steady on band. Hold momentarily. Return slowly, keeping pull steady, back to start. Repeat _10-20__ times. Band color _yellow_____   Side Pull: Double Arm   On back, knees bent, feet flat. Arms perpendicular to body, shoulder level, elbows straight but relaxed. Pull arms out to sides, elbows straight. Resistance band comes across collarbones, hands toward floor. Hold momentarily. Slowly return to starting position. Repeat __10-20_ times. Band color _yellow____   Sash   On back, knees bent, feet flat, left hand on left hip, right hand above left. Pull right arm DIAGONALLY (hip to shoulder) across chest. Bring right arm along head toward floor. Hold momentarily. Slowly return to starting position. Repeat _1--20__ times. Do with left arm. Band color ___yellow___   Shoulder Rotation: Double Arm   On back, knees bent, feet flat, elbows tucked at sides, bent 90, hands palms up. Pull hands apart and down toward floor, keeping elbows near sides. Hold momentarily. Slowly return to starting position. Repeat _10-20__ times. Band color _yellow_____

## 2015-07-07 NOTE — Therapy (Signed)
[place], [place], [address] Phone: [phone]   Fax:  [phone]  Physical Therapy Treatment  Patient Details  Name: [name] MRN: [id number] Date of Birth: August 16, 1965 Referring Provider: Jamse Arn, MD  Encounter Date: 07/07/2015      PT End of Session - 07/07/15 1355    Visit Number 6   Number of Visits 18   Date for PT Re-Evaluation 07/24/15   PT Start Time 6503   PT Stop Time 1130  Extra time needed for bathroom break   PT Time Calculation (min) 75 min   Activity Tolerance Patient tolerated treatment well   Behavior During Therapy [facility] for tasks assessed/performed      Past Medical History  Diagnosis Date  . Prolonged QT interval     gene positive  . HTN (hypertension)   . Atypical chest pain     negative Myoview in 2011 with no ischemi and normal LV function.   . Obesity   . OSA (obstructive sleep apnea)     uses C-PAP  . Anxiety   . Depression   . Peripheral vascular disease ([place])     blood clot in right leg  . GERD (gastroesophageal reflux disease)     uses Nexium  . Seizures ([place])     hasn't had any for 4 years  . Arthritis     osteoarthritis  . Anemia   . DVT (deep venous thrombosis) ([place])   . PE (pulmonary embolism)     Past Surgical History  Procedure Laterality Date  . Total hip arthroplasty    . Lumbar disc surgery    . Left foot surgery    . Left hand surgery x 2    . Right hand surgery for torn ligament    . Colonoscopy    . Cardiac catheterization    . Dilation and curettage of uterus    . Anterior cervical decomp/discectomy fusion N/A 06/25/2013    Procedure: ANTERIOR CERVICAL DECOMPRESSION/DISCECTOMY FUSION 2 LEVELS LEVELS C FOUR/FIVE, SIX/SEVEN;  Surgeon: Floyce Stakes, MD;  Location: [facility];  Service: Neurosurgery;  Laterality: N/A;    There were no vitals filed for this visit.  Visit Diagnosis:  Neck pain  Weakness  generalized  Decreased range of motion of neck  Decreased functional activity tolerance  Unsteadiness  Balance problems      Subjective Assessment - 07/07/15 1023    Subjective Neck 5/10,  back 5/10  Has not been here since last visit due to sinus infection. Saw Dr Posey Pronto. last week.  Changed her sleep medicine and started a muscle relaxer started and stopped due to nightmares.  She tripped on a sidewalk crack and fell 2 weeks ago.   Multiple Pain Sites --  Neck.  5/10 tingles in arm,  chronic,  gets tringling with activity.    Pain Score 5   Pain Location Back   Pain Orientation Lower   Pain Descriptors / Indicators Sore   Pain Radiating Towards RT lower thoracic side.    Pain Frequency Constant   Aggravating Factors  housework, picking up Son's clothes.     Pain Relieving Factors resting                          [facility] Adult PT Treatment/Exercise - 07/07/15 0001    Ambulation/Gait   Ambulation/Gait Yes   Curb 6: Modified independent (Device/increase time);5: Supervision  Gait Comments steps, cane with rail, short, and 6 " curbs, hill , grass, pavement SBA cues initiallly.     Neck Exercises: Supine   Other Supine Exercise Supine scapular stabilization exercises. ER/ horizontal pull, narrow grip. and sash  10 X each with instruction   Moist Heat Therapy   Number Minutes Moist Heat 15 Minutes   Moist Heat Location Cervical;Lumbar Spine   Electrical Stimulation   Electrical Stimulation Location cervical, low back   Electrical Stimulation Action IFC   Electrical Stimulation Parameters to tolerance   Electrical Stimulation Goals Pain                PT Education - 07/07/15 1354    Education provided Yes   Education Details scapular bands, Gait training   Person(s) Educated Patient   Methods Explanation;Demonstration;Tactile cues;Verbal cues;Handout   Comprehension Verbalized understanding;Returned demonstration          PT Short Term Goals -  07/07/15 1359    PT SHORT TERM GOAL #1   Title Patient reports cervical pain </= 7/10 highest in 1 week period. (Target Date: 06/26/2015)   Status Achieved   PT SHORT TERM GOAL #2   Title Patient demonstrates initial HEP for cervical pain, ROM & strength.  (Target Date: 06/26/2015)   Status Achieved   PT SHORT TERM GOAL #3   Title Patient demonstrates HEP for LE strength & balance to decrease falls.  (Target Date: 06/26/2015)   Baseline Has not fallen in 2 weeks,  HEP ongoing   Time 1   Period Weeks   Status On-going   PT SHORT TERM GOAL #4   Title Patient reports able to brush her hair intermittently raising hands to head over 5 minute period with pain increasing <3 increments on 0-10 scale.  (Target Date: 06/26/2015)   Time 1   Period Weeks   Status Unable to assess           PT Long Term Goals - 07/07/15 1400    PT LONG TERM GOAL #1   Title Patient reports cervical pain </= 6/10 over 1 week period with ADLs.  (Target Date: 07/24/2015)   Baseline varies,   Time 2   Period Weeks   Status Partially Met   PT LONG TERM GOAL #2   Title Patient demonstrates HEP / fitness plan including cervical & LE exercises.  (Target Date: 07/24/2015)   Time 2   Period Weeks   Status On-going   PT LONG TERM GOAL #3   Title Patient self reports using FOTO 10% improvement in Neck Disability Index.  (Target Date: 07/24/2015)   Time 2   Period Weeks   Status Unable to assess   PT LONG TERM GOAL #4   Title Berg Balance >/= 45/56 to indicate lower fall risk.  (Target Date: 07/24/2015)   Time 2   Period Weeks   Status Unable to assess               Plan - 07/07/15 1356    Clinical Impression Statement Patient was happy with cane issued today.  She was safe after instruction with cane.  Progress toward home exercise goals.  Gap in care due to patient getting a sinus infection.    PT Next Visit Plan review scapulat stabilization exercises.  Check BERG?  Needs to see PT next visit.   PT Home  Exercise Plan scapular bands, supine   Consulted and Agree with Plan of Care Patient  Problem List Patient Active Problem List   Diagnosis Date Noted  . Difficulty hearing 10/05/2014  . 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 Deep vein thrombosis of lower extremity ([place]) 10/05/2014  . Edema extremities 10/05/2014  . Esophagitis, reflux 10/05/2014  . Degenerative arthritis of hip 10/05/2014  . Depression, major, single episode, in partial remission ([place]) 10/05/2014  . Pulmonary infarction ([place]) 10/05/2014  . Idiopathic insomnia 10/05/2014  . Cervical stenosis of spinal canal 06/25/2013  . Postural dizziness 06/22/2012  . Essential hypertension 06/22/2012  . Morbid obesity ([place]) 08/17/2010  . UNSPECIFIED ENDOCRINE DISORDER 05/15/2009  . CHEST PAIN UNSPECIFIED 01/08/2009  . DEPRESSION 08/21/2008  . OSTEOPOROSIS 08/21/2008  . LONG QT SYNDROME 08/21/2008    Petrona Wyeth 07/07/2015, 2:05 PM  [facility] [address] [place], [place], [address] Phone: [phone]   Fax:  [phone]  Name: BRAD MCGAUGHY MRN: [id number] Date of Birth: 23-Jan-1966    Melvenia Needles, PTA 07/07/2015 2:05 PM Phone: [phone] Fax: [phone]

## 2015-07-08 ENCOUNTER — Other Ambulatory Visit: Payer: Self-pay

## 2015-07-08 NOTE — Telephone Encounter (Signed)
Received a fax stating that patient had refills on Rx from last year but they were expired. Please send over new Rx for medication.

## 2015-07-09 ENCOUNTER — Ambulatory Visit: Payer: Medicare Other | Admitting: Physical Therapy

## 2015-07-09 DIAGNOSIS — R29898 Other symptoms and signs involving the musculoskeletal system: Secondary | ICD-10-CM | POA: Diagnosis not present

## 2015-07-09 DIAGNOSIS — R531 Weakness: Secondary | ICD-10-CM

## 2015-07-09 DIAGNOSIS — M545 Low back pain, unspecified: Secondary | ICD-10-CM

## 2015-07-09 DIAGNOSIS — R29818 Other symptoms and signs involving the nervous system: Secondary | ICD-10-CM | POA: Diagnosis not present

## 2015-07-09 DIAGNOSIS — R6889 Other general symptoms and signs: Secondary | ICD-10-CM | POA: Diagnosis not present

## 2015-07-09 DIAGNOSIS — M542 Cervicalgia: Secondary | ICD-10-CM

## 2015-07-09 DIAGNOSIS — R2681 Unsteadiness on feet: Secondary | ICD-10-CM

## 2015-07-09 DIAGNOSIS — R2689 Other abnormalities of gait and mobility: Secondary | ICD-10-CM

## 2015-07-09 NOTE — Therapy (Addendum)
[place], [place], [address] Phone: [phone]   Fax:  [phone]  Physical Therapy Treatment/Discharge  Patient Details  Name: [name] MRN: [id number] Date of Birth: 26-Jul-1965 Referring Provider: Jamse Arn, MD  Encounter Date: 07/09/2015      PT End of Session - 07/09/15 1014    Visit Number 7   Number of Visits 18   Date for PT Re-Evaluation 07/24/15   PT Start Time 8676   PT Stop Time 1120   PT Time Calculation (min) 65 min   Activity Tolerance Patient tolerated treatment well   Behavior During Therapy [facility] for tasks assessed/performed      Past Medical History  Diagnosis Date  . Prolonged QT interval     gene positive  . HTN (hypertension)   . Atypical chest pain     negative Myoview in 2011 with no ischemi and normal LV function.   . Obesity   . OSA (obstructive sleep apnea)     uses C-PAP  . Anxiety   . Depression   . Peripheral vascular disease ([place])     blood clot in right leg  . GERD (gastroesophageal reflux disease)     uses Nexium  . Seizures ([place])     hasn't had any for 4 years  . Arthritis     osteoarthritis  . Anemia   . DVT (deep venous thrombosis) ([place])   . PE (pulmonary embolism)     Past Surgical History  Procedure Laterality Date  . Total hip arthroplasty    . Lumbar disc surgery    . Left foot surgery    . Left hand surgery x 2    . Right hand surgery for torn ligament    . Colonoscopy    . Cardiac catheterization    . Dilation and curettage of uterus    . Anterior cervical decomp/discectomy fusion N/A 06/25/2013    Procedure: ANTERIOR CERVICAL DECOMPRESSION/DISCECTOMY FUSION 2 LEVELS LEVELS C FOUR/FIVE, SIX/SEVEN;  Surgeon: Floyce Stakes, MD;  Location: [facility];  Service: Neurosurgery;  Laterality: N/A;    There were no vitals filed for this visit.  Visit Diagnosis:  Neck pain  Weakness generalized  Decreased range of motion  of neck  Decreased functional activity tolerance  Unsteadiness  Balance problems  Midline low back pain without sciatica      Subjective Assessment - 07/09/15 1016    Subjective Neck feels worse today, sore, maybe I slept wrong.  My back feels heavy.  Happy with her cane.     Currently in Pain? Yes   Pain Score 6    Pain Location Neck   Pain Orientation Right;Posterior   Pain Descriptors / Indicators Tightness;Sore;Numbness   Pain Type Chronic pain   Pain Onset More than a month ago   Pain Frequency Intermittent   Aggravating Factors  sleeping wrong, turning head, lifting off pillow   Pain Relieving Factors rest,massage, heat and IFC   Multiple Pain Sites No            [facility] PT Assessment - 07/09/15 1018    Berg Balance Test   Sit to Stand Able to stand without using hands and stabilize independently   Standing Unsupported Able to stand safely 2 minutes   Sitting with Back Unsupported but Feet Supported on Floor or Stool Able to sit safely and securely 2 minutes   Stand to Sit Sits safely with minimal use of hands  Transfers Able to transfer safely, minor use of hands   Standing Unsupported with Eyes Closed Able to stand 10 seconds safely   Standing Ubsupported with Feet Together Able to place feet together independently and stand 1 minute safely   From Standing, Reach Forward with Outstretched Arm Can reach confidently >25 cm (10")   From Standing Position, Pick up Object from Floor Able to pick up shoe safely and easily   From Standing Position, Turn to Look Behind Over each Shoulder Looks behind from both sides and weight shifts well   Turn 360 Degrees Able to turn 360 degrees safely in 4 seconds or less   Standing Unsupported, Alternately Place Feet on Step/Stool Able to stand independently and safely and complete 8 steps in 20 seconds   Standing Unsupported, One Foot in Front Able to take small step independently and hold 30 seconds   Standing on One Leg Tries to  lift leg/unable to hold 3 seconds but remains standing independently   Total Score 51   Berg comment: much improved           [facility] Adult PT Treatment/Exercise - 07/09/15 1034    Neck Exercises: Supine   Neck Retraction 10 reps;5 secs   Capital Flexion 10 reps   Capital Flexion Limitations gentle flex, ext on ball x 10    Cervical Rotation 10 reps   Cervical Rotation Limitations on ball under neck    Other Supine Exercise Supine scapular stabilization exercises. ER/ horizontal pull, narrow grip. and sash  10 X each with instruction   Moist Heat Therapy   Moist Heat Location Cervical;Lumbar Spine   Electrical Stimulation   Electrical Stimulation Location cervical, low back   Electrical Stimulation Goals Pain   Manual Therapy   Soft tissue mobilization bilateral post cervicals, upper trap, mod pressure in sitting    Neck Exercises: Stretches   Upper Trapezius Stretch 2 reps;30 seconds  Rt.    Levator Stretch 2 reps;30 seconds  Rt                PT Education - 07/09/15 1105    Education provided Yes   Education Details massage, stretches to ease stiffness HEP, balance improvement   Person(s) Educated Patient   Methods Explanation;Verbal cues   Comprehension Verbalized understanding          PT Short Term Goals - 07/07/15 1359    PT SHORT TERM GOAL #1   Title Patient reports cervical pain </= 7/10 highest in 1 week period. (Target Date: 06/26/2015)   Status Achieved   PT SHORT TERM GOAL #2   Title Patient demonstrates initial HEP for cervical pain, ROM & strength.  (Target Date: 06/26/2015)   Status Achieved   PT SHORT TERM GOAL #3   Title Patient demonstrates HEP for LE strength & balance to decrease falls.  (Target Date: 06/26/2015)   Baseline Has not fallen in 2 weeks,  HEP ongoing   Time 1   Period Weeks   Status On-going   PT SHORT TERM GOAL #4   Title Patient reports able to brush her hair intermittently raising hands to head over 5 minute period with  pain increasing <3 increments on 0-10 scale.  (Target Date: 06/26/2015)   Time 1   Period Weeks   Status Unable to assess           PT Long Term Goals - 07/07/15 1400    PT LONG TERM GOAL #1   Title Patient reports cervical  pain </= 6/10 over 1 week period with ADLs.  (Target Date: 07/24/2015)   Baseline varies,   Time 2   Period Weeks   Status Partially Met   PT LONG TERM GOAL #2   Title Patient demonstrates HEP / fitness plan including cervical & LE exercises.  (Target Date: 07/24/2015)   Time 2   Period Weeks   Status On-going   PT LONG TERM GOAL #3   Title Patient self reports using FOTO 10% improvement in Neck Disability Index.  (Target Date: 07/24/2015)   Time 2   Period Weeks   Status Unable to assess   PT LONG TERM GOAL #4   Title Berg Balance >/= 45/56 to indicate lower fall risk.  (Target Date: 07/24/2015)   Time 2   Period Weeks   Status Unable to assess               Plan - 07/09/15 1107    Clinical Impression Statement Patient with dramatic improvement in balance. Confesses she does not do her neck stretches regularly.  Repsonded well ton manual therapy anfd soft tissue to Rt. lateral neck and shoulder.     PT Next Visit Plan review scapulat stabilization exercises. Give for HEP  and try some in standing, incorporate lumbar/core  Needs to see PT next visit.   PT Home Exercise Plan scapular bands, supine   Consulted and Agree with Plan of Care Patient        Problem List Patient Active Problem List   Diagnosis Date Noted  . Difficulty hearing 10/05/2014  . 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 Deep vein thrombosis of lower extremity ([place]) 10/05/2014  . Edema extremities 10/05/2014  . Esophagitis, reflux 10/05/2014  . Degenerative arthritis of hip 10/05/2014  . Depression, major, single episode, in partial remission ([place]) 10/05/2014  . Pulmonary infarction ([place]) 10/05/2014  . Idiopathic insomnia 10/05/2014  . Cervical stenosis of spinal canal  06/25/2013  . Postural dizziness 06/22/2012  . Essential hypertension 06/22/2012  . Morbid obesity ([place]) 08/17/2010  . UNSPECIFIED ENDOCRINE DISORDER 05/15/2009  . CHEST PAIN UNSPECIFIED 01/08/2009  . DEPRESSION 08/21/2008  . OSTEOPOROSIS 08/21/2008  . LONG QT SYNDROME 08/21/2008    Naszir Cott 07/09/2015, 11:08 AM  [facility] [address] [place], [place], [address] Phone: [phone]   Fax:  [phone]  Name: IRIEL NASON MRN: [id number] Date of Birth: 1966/01/14    Raeford Razor, PT 07/09/2015 11:08 AM Phone: [phone] Fax: [phone]   PHYSICAL THERAPY DISCHARGE SUMMARY  Visits from Start of Care: 7  Current functional level related to goals / functional outcomes: See above for most recent info    Remaining deficits: Pain, ROM, strength, balance, see above    Education / Equipment: Posture, HEP, balance and fall risk  Plan: Patient agrees to discharge.  Patient goals were partially met. Patient is being discharged due to not returning since the last visit.  ?????    Raeford Razor, PT 12/14/15 8:39 AM Phone: [phone] Fax: [phone]

## 2015-08-27 DIAGNOSIS — Z79899 Other long term (current) drug therapy: Secondary | ICD-10-CM | POA: Diagnosis not present

## 2015-08-27 DIAGNOSIS — I10 Essential (primary) hypertension: Secondary | ICD-10-CM | POA: Diagnosis not present

## 2015-08-27 DIAGNOSIS — N393 Stress incontinence (female) (male): Secondary | ICD-10-CM | POA: Diagnosis not present

## 2015-08-27 DIAGNOSIS — G4700 Insomnia, unspecified: Secondary | ICD-10-CM | POA: Diagnosis not present

## 2015-08-27 DIAGNOSIS — D509 Iron deficiency anemia, unspecified: Secondary | ICD-10-CM | POA: Diagnosis not present

## 2015-08-27 DIAGNOSIS — R1013 Epigastric pain: Secondary | ICD-10-CM | POA: Diagnosis not present

## 2015-08-27 DIAGNOSIS — I519 Heart disease, unspecified: Secondary | ICD-10-CM | POA: Diagnosis not present

## 2015-08-27 DIAGNOSIS — F3340 Major depressive disorder, recurrent, in remission, unspecified: Secondary | ICD-10-CM | POA: Diagnosis not present

## 2015-08-27 DIAGNOSIS — Z6841 Body Mass Index (BMI) 40.0 and over, adult: Secondary | ICD-10-CM | POA: Diagnosis not present

## 2015-08-27 DIAGNOSIS — K219 Gastro-esophageal reflux disease without esophagitis: Secondary | ICD-10-CM | POA: Diagnosis not present

## 2015-09-14 DIAGNOSIS — R1013 Epigastric pain: Secondary | ICD-10-CM | POA: Diagnosis not present

## 2015-09-14 DIAGNOSIS — K219 Gastro-esophageal reflux disease without esophagitis: Secondary | ICD-10-CM | POA: Diagnosis not present

## 2015-09-30 ENCOUNTER — Encounter: Payer: Medicare Other | Attending: Physical Medicine & Rehabilitation | Admitting: Physical Medicine & Rehabilitation

## 2015-09-30 DIAGNOSIS — Z86711 Personal history of pulmonary embolism: Secondary | ICD-10-CM | POA: Insufficient documentation

## 2015-09-30 DIAGNOSIS — G894 Chronic pain syndrome: Secondary | ICD-10-CM | POA: Insufficient documentation

## 2015-09-30 DIAGNOSIS — F419 Anxiety disorder, unspecified: Secondary | ICD-10-CM | POA: Insufficient documentation

## 2015-09-30 DIAGNOSIS — Z9181 History of falling: Secondary | ICD-10-CM | POA: Insufficient documentation

## 2015-09-30 DIAGNOSIS — K219 Gastro-esophageal reflux disease without esophagitis: Secondary | ICD-10-CM | POA: Insufficient documentation

## 2015-09-30 DIAGNOSIS — Z79899 Other long term (current) drug therapy: Secondary | ICD-10-CM | POA: Insufficient documentation

## 2015-09-30 DIAGNOSIS — M792 Neuralgia and neuritis, unspecified: Secondary | ICD-10-CM | POA: Insufficient documentation

## 2015-09-30 DIAGNOSIS — F329 Major depressive disorder, single episode, unspecified: Secondary | ICD-10-CM | POA: Insufficient documentation

## 2015-09-30 DIAGNOSIS — I739 Peripheral vascular disease, unspecified: Secondary | ICD-10-CM | POA: Insufficient documentation

## 2015-09-30 DIAGNOSIS — Z87891 Personal history of nicotine dependence: Secondary | ICD-10-CM | POA: Insufficient documentation

## 2015-09-30 DIAGNOSIS — G729 Myopathy, unspecified: Secondary | ICD-10-CM | POA: Insufficient documentation

## 2015-09-30 DIAGNOSIS — Z7982 Long term (current) use of aspirin: Secondary | ICD-10-CM | POA: Insufficient documentation

## 2015-09-30 DIAGNOSIS — Z7901 Long term (current) use of anticoagulants: Secondary | ICD-10-CM | POA: Insufficient documentation

## 2015-09-30 DIAGNOSIS — R42 Dizziness and giddiness: Secondary | ICD-10-CM | POA: Insufficient documentation

## 2015-09-30 DIAGNOSIS — G4700 Insomnia, unspecified: Secondary | ICD-10-CM | POA: Insufficient documentation

## 2015-09-30 DIAGNOSIS — Z8249 Family history of ischemic heart disease and other diseases of the circulatory system: Secondary | ICD-10-CM | POA: Insufficient documentation

## 2015-09-30 DIAGNOSIS — R0789 Other chest pain: Secondary | ICD-10-CM | POA: Insufficient documentation

## 2015-09-30 DIAGNOSIS — G4733 Obstructive sleep apnea (adult) (pediatric): Secondary | ICD-10-CM | POA: Insufficient documentation

## 2015-09-30 DIAGNOSIS — Z6841 Body Mass Index (BMI) 40.0 and over, adult: Secondary | ICD-10-CM | POA: Insufficient documentation

## 2015-09-30 DIAGNOSIS — I10 Essential (primary) hypertension: Secondary | ICD-10-CM | POA: Insufficient documentation

## 2015-10-01 ENCOUNTER — Other Ambulatory Visit: Payer: Self-pay | Admitting: *Deleted

## 2015-10-01 ENCOUNTER — Other Ambulatory Visit: Payer: Self-pay

## 2015-10-01 MED ORDER — LABETALOL HCL 200 MG PO TABS: 200.0000 mg | ORAL_TABLET | Freq: Two times a day (BID) | ORAL | Status: DC

## 2015-10-01 NOTE — Telephone Encounter (Signed)
Sonia Sprang, MD at 08/05/2014 4:06 PM  labetalol (NORMODYNE) 200 MG tabletTake 1 tablet (200 mg total) by mouth 2 (two) times daily Patient Instructions     Your physician recommends that you continue on your current medications as directed. Please refer to the Current Medication list given to you today

## 2015-10-13 ENCOUNTER — Telehealth: Payer: Self-pay | Admitting: Internal Medicine

## 2015-10-13 NOTE — Telephone Encounter (Signed)
New Message  Pt call requesting a letter from the doctor to pt [place], about her health condition and why she needs to have power at this time. Please call pt back if you need to discuss.

## 2015-10-13 NOTE — Telephone Encounter (Signed)
I called and spoke with the patient. She states she has been without power for 2 months and is living with her cousin. She has a history of long QT syndrome and is on C-PAP. Her C-PAP is managed by her PCP. She is on no other medical equipment that requires electricity. She wanted Dr. Caryl Comes to do this letter in regards to her long QT/ CPAP.  I advised her I will review with Dr. Caryl Comes and call her back. She is agreeable.

## 2015-10-15 NOTE — Telephone Encounter (Signed)
Unfortunately there is no Cardiac issue for a letter

## 2015-10-15 NOTE — Telephone Encounter (Signed)
The patient is aware that per Dr. Caryl Comes, there is no cardiac issue for letter to be done for the power company. I have advised her to contact her PCP to see if they may be able to assist her with this in regards to her sleep apnea. She voices understanding.

## 2015-10-22 ENCOUNTER — Other Ambulatory Visit: Payer: Self-pay | Admitting: *Deleted

## 2015-10-22 MED ORDER — LABETALOL HCL 200 MG PO TABS: 200.0000 mg | ORAL_TABLET | Freq: Two times a day (BID) | ORAL | Status: DC

## 2015-10-22 NOTE — Telephone Encounter (Signed)
labetalol (NORMODYNE) 200 MG tablet  Medication   Date: 10/01/2015  Department: [facility] [place] Office  Ordering/Authorizing: Deboraha Sprang, MD      Order Providers    Prescribing Provider Encounter Provider   Deboraha Sprang, MD Roberts Gaudy, CMA    Medication Detail      Disp Refills Start End     labetalol (NORMODYNE) 200 MG tablet 60 tablet 0 10/01/2015     Sig - Route: Take 1 tablet (200 mg total) by mouth 2 (two) times daily. - Oral    Notes to Pharmacy: Please call and schedule follow up office visit to receive further refills. [phone]. Thank you.    E-Prescribing Status: Receipt confirmed by pharmacy (10/01/2015 4:07 PM EDT)     [place], [place]   Sending 2nd attempt

## 2015-11-05 DIAGNOSIS — I10 Essential (primary) hypertension: Secondary | ICD-10-CM | POA: Diagnosis not present

## 2015-11-05 DIAGNOSIS — S161XXA Strain of muscle, fascia and tendon at neck level, initial encounter: Secondary | ICD-10-CM | POA: Diagnosis not present

## 2015-11-05 DIAGNOSIS — M79603 Pain in arm, unspecified: Secondary | ICD-10-CM | POA: Diagnosis not present

## 2015-11-09 DIAGNOSIS — S46211A Strain of muscle, fascia and tendon of other parts of biceps, right arm, initial encounter: Secondary | ICD-10-CM | POA: Diagnosis not present

## 2015-11-10 DIAGNOSIS — S46211A Strain of muscle, fascia and tendon of other parts of biceps, right arm, initial encounter: Secondary | ICD-10-CM | POA: Diagnosis not present

## 2015-11-10 DIAGNOSIS — M7989 Other specified soft tissue disorders: Secondary | ICD-10-CM | POA: Diagnosis not present

## 2015-11-17 ENCOUNTER — Ambulatory Visit
Admission: RE | Admit: 2015-11-17 | Discharge: 2015-11-17 | Disposition: A | Discharge status: Home / Self Care | Payer: Medicare Other | Source: Ambulatory Visit | Attending: Gastroenterology | Admitting: Gastroenterology

## 2015-11-17 ENCOUNTER — Ambulatory Visit: Payer: Medicare Other | Admitting: Anesthesiology

## 2015-11-17 ENCOUNTER — Encounter
Admission: RE | Disposition: A | Discharge status: Home / Self Care | Payer: Self-pay | Source: Ambulatory Visit | Attending: Gastroenterology

## 2015-11-17 ENCOUNTER — Ambulatory Visit: Payer: Medicare Other

## 2015-11-17 ENCOUNTER — Encounter: Payer: Self-pay | Admitting: *Deleted

## 2015-11-17 DIAGNOSIS — Z79899 Other long term (current) drug therapy: Secondary | ICD-10-CM | POA: Diagnosis not present

## 2015-11-17 DIAGNOSIS — I10 Essential (primary) hypertension: Secondary | ICD-10-CM | POA: Diagnosis not present

## 2015-11-17 DIAGNOSIS — Z86711 Personal history of pulmonary embolism: Secondary | ICD-10-CM | POA: Diagnosis not present

## 2015-11-17 DIAGNOSIS — I4581 Long QT syndrome: Secondary | ICD-10-CM | POA: Diagnosis not present

## 2015-11-17 DIAGNOSIS — D649 Anemia, unspecified: Secondary | ICD-10-CM | POA: Diagnosis not present

## 2015-11-17 DIAGNOSIS — E669 Obesity, unspecified: Secondary | ICD-10-CM | POA: Insufficient documentation

## 2015-11-17 DIAGNOSIS — G4733 Obstructive sleep apnea (adult) (pediatric): Secondary | ICD-10-CM | POA: Diagnosis not present

## 2015-11-17 DIAGNOSIS — K2970 Gastritis, unspecified, without bleeding: Secondary | ICD-10-CM | POA: Diagnosis not present

## 2015-11-17 DIAGNOSIS — Z86718 Personal history of other venous thrombosis and embolism: Secondary | ICD-10-CM | POA: Insufficient documentation

## 2015-11-17 DIAGNOSIS — Z7982 Long term (current) use of aspirin: Secondary | ICD-10-CM | POA: Diagnosis not present

## 2015-11-17 DIAGNOSIS — K219 Gastro-esophageal reflux disease without esophagitis: Secondary | ICD-10-CM | POA: Diagnosis not present

## 2015-11-17 DIAGNOSIS — F329 Major depressive disorder, single episode, unspecified: Secondary | ICD-10-CM | POA: Insufficient documentation

## 2015-11-17 DIAGNOSIS — K449 Diaphragmatic hernia without obstruction or gangrene: Secondary | ICD-10-CM | POA: Diagnosis not present

## 2015-11-17 DIAGNOSIS — F419 Anxiety disorder, unspecified: Secondary | ICD-10-CM | POA: Insufficient documentation

## 2015-11-17 DIAGNOSIS — R079 Chest pain, unspecified: Secondary | ICD-10-CM

## 2015-11-17 DIAGNOSIS — M1990 Unspecified osteoarthritis, unspecified site: Secondary | ICD-10-CM | POA: Diagnosis not present

## 2015-11-17 DIAGNOSIS — Z981 Arthrodesis status: Secondary | ICD-10-CM | POA: Insufficient documentation

## 2015-11-17 DIAGNOSIS — I739 Peripheral vascular disease, unspecified: Secondary | ICD-10-CM | POA: Diagnosis not present

## 2015-11-17 DIAGNOSIS — K319 Disease of stomach and duodenum, unspecified: Secondary | ICD-10-CM | POA: Insufficient documentation

## 2015-11-17 DIAGNOSIS — Z6841 Body Mass Index (BMI) 40.0 and over, adult: Secondary | ICD-10-CM | POA: Insufficient documentation

## 2015-11-17 DIAGNOSIS — I8500 Esophageal varices without bleeding: Secondary | ICD-10-CM | POA: Diagnosis not present

## 2015-11-17 DIAGNOSIS — R52 Pain, unspecified: Secondary | ICD-10-CM | POA: Diagnosis not present

## 2015-11-17 DIAGNOSIS — K2960 Other gastritis without bleeding: Secondary | ICD-10-CM | POA: Diagnosis not present

## 2015-11-17 DIAGNOSIS — Z96649 Presence of unspecified artificial hip joint: Secondary | ICD-10-CM | POA: Diagnosis not present

## 2015-11-17 DIAGNOSIS — K3189 Other diseases of stomach and duodenum: Secondary | ICD-10-CM | POA: Diagnosis not present

## 2015-11-17 DIAGNOSIS — K2950 Unspecified chronic gastritis without bleeding: Secondary | ICD-10-CM | POA: Diagnosis not present

## 2015-11-17 DIAGNOSIS — R1013 Epigastric pain: Secondary | ICD-10-CM | POA: Diagnosis not present

## 2015-11-17 HISTORY — PX: ESOPHAGOGASTRODUODENOSCOPY (EGD) WITH PROPOFOL: SHX5813

## 2015-11-17 LAB — POCT PREGNANCY, URINE: PREG TEST UR: NEGATIVE

## 2015-11-17 SURGERY — ESOPHAGOGASTRODUODENOSCOPY (EGD) WITH PROPOFOL
Anesthesia: General

## 2015-11-17 MED ORDER — MIDAZOLAM HCL 5 MG/5ML IJ SOLN
INTRAMUSCULAR | Status: DC | PRN
  Administered 2015-11-17 (×2): 1 mg via INTRAVENOUS

## 2015-11-17 MED ORDER — SODIUM CHLORIDE 0.9 % IV SOLN
INTRAVENOUS | Status: DC
  Administered 2015-11-17: 13:00:00 via INTRAVENOUS

## 2015-11-17 MED ORDER — FENTANYL CITRATE (PF) 100 MCG/2ML IJ SOLN
INTRAMUSCULAR | Status: DC | PRN
  Administered 2015-11-17: 50 ug via INTRAVENOUS

## 2015-11-17 MED ORDER — SODIUM CHLORIDE 0.9 % IV SOLN: INTRAVENOUS | Status: DC

## 2015-11-17 MED ORDER — ONDANSETRON HCL 4 MG/2ML IJ SOLN
INTRAMUSCULAR | Status: DC | PRN
  Administered 2015-11-17: 4 mg via INTRAVENOUS

## 2015-11-17 MED ORDER — PROPOFOL 500 MG/50ML IV EMUL
INTRAVENOUS | Status: DC | PRN
  Administered 2015-11-17: 180 ug/kg/min via INTRAVENOUS

## 2015-11-17 MED ORDER — PROPOFOL 10 MG/ML IV BOLUS
INTRAVENOUS | Status: DC | PRN
  Administered 2015-11-17: 100 mg via INTRAVENOUS
  Administered 2015-11-17: 20 mg via INTRAVENOUS

## 2015-11-17 MED ORDER — LIDOCAINE 2% (20 MG/ML) 5 ML SYRINGE
INTRAMUSCULAR | Status: DC | PRN
  Administered 2015-11-17: 40 mg via INTRAVENOUS

## 2015-11-17 NOTE — Anesthesia Postprocedure Evaluation (Signed)
Anesthesia Post Note  Patient: ANDREINA VIBBERT  Procedure(s) Performed: Procedure(s) (LRB): ESOPHAGOGASTRODUODENOSCOPY (EGD) WITH PROPOFOL (N/A)  Patient location during evaluation: Endoscopy Anesthesia Type: General Level of consciousness: awake and alert Pain management: pain level controlled Vital Signs Assessment: post-procedure vital signs reviewed and stable Respiratory status: spontaneous breathing, nonlabored ventilation, respiratory function stable and patient connected to nasal cannula oxygen Cardiovascular status: blood pressure returned to baseline and stable Postop Assessment: no signs of nausea or vomiting Anesthetic complications: no    Last Vitals:  Filed Vitals:   11/17/15 1408 11/17/15 1418  BP: 119/79 123/73  Pulse: 60 60  Temp:    Resp: 18 15    Last Pain: There were no vitals filed for this visit.               Martha Clan

## 2015-11-17 NOTE — Op Note (Signed)
[facility] Gastroenterology Patient Name: [name] Procedure Date: 11/17/2015 1:02 PM MRN: [id number] Account #: [id number] Date of Birth: June 15, 1965 Admit Type: Outpatient Age: 50 Room: [facility] ENDO ROOM 3 Gender: Female Note Status: Finalized Procedure:            Upper GI endoscopy Indications:          Epigastric abdominal pain, Gastro-esophageal reflux                        disease Providers:            Lollie Sails, MD Referring MD:         Lorin Mercy. [place] (Referring MD) Medicines:            Monitored Anesthesia Care Complications:        No immediate complications. Procedure:            Pre-Anesthesia Assessment:                       - ASA Grade Assessment: III - A patient with severe                        systemic disease.                       After obtaining informed consent, the endoscope was                        passed under direct vision. Throughout the procedure,                        the patient's blood pressure, pulse, and oxygen                        saturations were monitored continuously. The Endoscope                        was introduced through the mouth, and advanced to the                        third part of duodenum. The patient tolerated the                        procedure. Findings:      The Z-line was irregular. Biopsies were taken with a cold forceps for       histology.      A medium-sized hiatal hernia was found. The Z-line was a variable       distance from incisors; the hiatal hernia was sliding.      Possible small (< 3 mm) varices were found in the lower third of the       esophagus, noted deep to the surface.      Diffuse and patchy mild inflammation characterized by congestion (edema)       and erythema was found in the gastric body. Biopsies were taken with a       cold forceps for histology. Biopsies were taken with a cold forceps for       Helicobacter pylori testing.      The exam of the stomach  was otherwise normal.      The examined duodenum was normal. Impression:           -  Z-line irregular. Biopsied.                       - Medium-sized hiatal hernia.                       - Small (< 3 mm) esophageal varices.                       - Gastritis. Biopsied.                       - Normal examined duodenum. Recommendation:       - Observe patient in GI recovery unit for 1 hour.                       - Perform a CT scan (computed tomography) of chest with                        contrast and abdomen with contrast at appointment to be                        scheduled.                       - 2 way chest x ray today. re atypical left parasternal                        pain Procedure Code(s):    --- Professional ---                       [phone], Esophagogastroduodenoscopy, flexible, transoral;                        with biopsy, single or multiple Diagnosis Code(s):    --- Professional ---                       K22.8, Other specified diseases of esophagus                       K44.9, Diaphragmatic hernia without obstruction or                        gangrene                       I85.00, Esophageal varices without bleeding                       K29.70, Gastritis, unspecified, without bleeding                       R10.13, Epigastric pain                       K21.9, Gastro-esophageal reflux disease without                        esophagitis CPT copyright 2016 [employer] Medical [employer]. All rights reserved. The codes documented in this report are preliminary and upon coder review may  be revised to meet current compliance requirements. Lollie Sails, MD 11/17/2015 1:51:04 PM This report has been signed electronically. Number of Addenda: 0 Note Initiated  On: 11/17/2015 1:02 PM      [facility], [facility], [facility]

## 2015-11-17 NOTE — H&P (Signed)
Outpatient short stay form Pre-procedure 11/17/2015 12:58 PM Lollie Sails MD  Primary Physician: Charlott Holler NP  Reason for visit:  EGD  History of present illness:  Patient is a 50 year old female presenting today with complaint of epigastric pain. She also relates this more to being in the low left chest. It is acute in onset and a last only 10 or 15 minutes. And it is gone it is completely gone. It does not seem to be brought on by particular foods. She has been taking a proton pump inhibitor for at least 10 years. He states that does occur when she bends over particularly if she is "tying her shoes" it does not radiate. She has no reflux while taking her proton pump inhibitor. Change of proton pump inhibitor does not affect this discomfort. Patient does take 81 mg aspirin but has held that for several days. She takes no other aspirin products or blood thinning agents.   Current facility-administered medications:  .  0.9 %  sodium chloride infusion, , Intravenous, Continuous, Lollie Sails, MD .  0.9 %  sodium chloride infusion, , Intravenous, Continuous, Lollie Sails, MD  Prescriptions prior to admission  Medication Sig Dispense Refill Last Dose  . labetalol (NORMODYNE) 200 MG tablet Take 1 tablet (200 mg total) by mouth 2 (two) times daily. 30 tablet 0 11/16/2015 at Unknown time  . albuterol (PROVENTIL HFA;VENTOLIN HFA) 108 (90 BASE) MCG/ACT inhaler Inhale 1-2 puffs into the lungs every 4 (four) hours as needed for wheezing or shortness of breath.   06/15/2015 at Unknown time  . amitriptyline (ELAVIL) 150 MG tablet Take 0.5 tablets (75 mg total) by mouth at bedtime. 30 tablet 0   . aspirin 81 MG tablet Take 81 mg by mouth daily.     06/15/2015 at Unknown time  . budesonide-formoterol (SYMBICORT) 160-4.5 MCG/ACT inhaler Inhale 2 puffs into the lungs 2 (two) times daily as needed (for shortness of breath).   06/15/2015 at Unknown time  . escitalopram (LEXAPRO) 20 MG tablet Take 20 mg  by mouth daily.    06/15/2015 at Unknown time  . esomeprazole (NEXIUM) 40 MG capsule Take 40 mg by mouth daily at 12 noon.   06/15/2015 at Unknown time  . esomeprazole (NEXIUM) 40 MG capsule Take 1 capsule (40 mg total) by mouth at bedtime. 10 capsule 0   . Ferrous Sulfate (SLOW RELEASE IRON PO) Take 1 tablet by mouth daily.    06/15/2015 at Unknown time  . fluocinonide (LIDEX) 0.05 % external solution Apply 1 application topically 2 (two) times daily.   06/15/2015 at Unknown time  . furosemide (LASIX) 20 MG tablet TAKE (1) TABLET BY MOUTH EVERY DAY 30 tablet 5 06/15/2015 at Unknown time  . Glucosamine-Chondroit-Vit C-Mn (GLUCOSAMINE 1500 COMPLEX) CAPS Take 1 each by mouth daily.   06/15/2015 at Unknown time  . hyoscyamine (LEVSIN SL) 0.125 MG SL tablet Take 1 tablet by mouth 2 (two) times daily as needed. For nausea   06/15/2015 at Unknown time  . Multiple Vitamins-Minerals (MULTIVITAMIN WITH MINERALS) tablet Take 1 tablet by mouth daily.   06/15/2015 at Unknown time  . nabumetone (RELAFEN) 750 MG tablet Take 1 tablet (750 mg total) by mouth 2 (two) times daily. 60 tablet 5   . Omega-3 Fatty Acids (FISH OIL) 1000 MG CAPS Take 1 capsule by mouth 3 (three) times daily.     06/15/2015 at Unknown time  . polyethylene glycol powder (GLYCOLAX/MIRALAX) powder Take by mouth.     Marland Kitchen  potassium chloride SA (K-DUR,KLOR-CON) 20 MEQ tablet Take 1 tablet (20 mEq total) by mouth daily. 30 tablet 5 06/15/2015 at Unknown time  . spironolactone (ALDACTONE) 25 MG tablet Take 1 tablet (25 mg total) by mouth daily. 30 tablet 5 06/15/2015 at Unknown time  . triamterene-hydrochlorothiazide (MAXZIDE) 75-50 MG per tablet Take 1 tablet by mouth daily.   06/15/2015 at Unknown time  . VOLTAREN 1 % GEL Apply 2 g topically 4 (four) times daily as needed (for pain).    06/15/2015 at Unknown time     No Known Allergies   Past Medical History  Diagnosis Date  . Prolonged QT interval     gene positive  . HTN (hypertension)   . Atypical  chest pain     negative Myoview in 2011 with no ischemi and normal LV function.   . Obesity   . OSA (obstructive sleep apnea)     uses C-PAP  . Anxiety   . Depression   . Peripheral vascular disease ([place])     blood clot in right leg  . GERD (gastroesophageal reflux disease)     uses Nexium  . Seizures ([place])     hasn't had any for 4 years  . Arthritis     osteoarthritis  . Anemia   . DVT (deep venous thrombosis) ([place])   . PE (pulmonary embolism)     Review of systems:      Physical Exam    Heart and lungs: Regular rate and rhythm without rub or gallop, lungs are bilaterally clear    HEENT: Normocephalic atraumatic eyes are anicteric    Other:     Pertinant exam for procedure: Soft nontender nondistended, obese, bowel sounds positive normoactive.    Planned proceedures: EGD and indicated procedures. I have discussed the risks benefits and complications of procedures to include not limited to bleeding, infection, perforation and the risk of sedation and the patient wishes to proceed.    Lollie Sails, MD Gastroenterology 11/17/2015  12:58 PM

## 2015-11-17 NOTE — Transfer of Care (Signed)
Immediate Anesthesia Transfer of Care Note  Patient: [name]  Procedure(s) Performed: Procedure(s): ESOPHAGOGASTRODUODENOSCOPY (EGD) WITH PROPOFOL (N/A)  Patient Location: PACU and Endoscopy Unit  Anesthesia Type:General  Level of Consciousness: awake, oriented and patient cooperative  Airway & Oxygen Therapy: Patient Spontanous Breathing and Patient connected to nasal cannula oxygen  Post-op Assessment: Report given to RN and Post -op Vital signs reviewed and stable  Post vital signs: Reviewed and stable  Last Vitals:  Filed Vitals:   11/17/15 1234 11/17/15 1348  BP: 127/61 116/73  Pulse: 65 67  Temp: 37.2 C 36.1 C  Resp: 20 11    Last Pain: There were no vitals filed for this visit.       Complications: No apparent anesthesia complications

## 2015-11-17 NOTE — Anesthesia Preprocedure Evaluation (Signed)
Anesthesia Evaluation  Patient identified by MRN, date of birth, ID band Patient awake    Reviewed: Allergy & Precautions, H&P , NPO status , Patient's Chart, lab work & pertinent test results, reviewed documented beta blocker date and time   Airway Mallampati: II   Neck ROM: full    Dental  (+) Poor Dentition   Pulmonary neg pulmonary ROS, sleep apnea , former smoker,    Pulmonary exam normal        Cardiovascular hypertension, + Peripheral Vascular Disease  negative cardio ROS Normal cardiovascular exam Rate:Normal     Neuro/Psych Seizures -,  PSYCHIATRIC DISORDERS negative neurological ROS  negative psych ROS   GI/Hepatic negative GI ROS, Neg liver ROS, GERD  Medicated,  Endo/Other  negative endocrine ROS  Renal/GU negative Renal ROS  negative genitourinary   Musculoskeletal   Abdominal   Peds  Hematology negative hematology ROS (+) anemia ,   Anesthesia Other Findings Past Medical History:   Prolonged QT interval                                          Comment:gene positive   HTN (hypertension)                                           Atypical chest pain                                            Comment:negative Myoview in 2011 with no ischemi and               normal LV function.    Obesity                                                      OSA (obstructive sleep apnea)                                  Comment:uses C-PAP   Anxiety                                                      Depression                                                   Peripheral vascular disease ([facility])                              Comment:blood clot in right leg   GERD (gastroesophageal reflux disease)  Comment:uses Nexium   Seizures ([facility])                                                 Comment:hasn't had any for 4 years   Arthritis                                                   Comment:osteoarthritis   Anemia                                                       DVT (deep venous thrombosis) ([facility])                           PE (pulmonary embolism)                                    Past Surgical History:   TOTAL HIP ARTHROPLASTY                                        LUMBAR DISC SURGERY                                           left foot surgery                                             left hand surgery x 2                                         right hand surgery for torn ligament                          COLONOSCOPY                                                   CARDIAC CATHETERIZATION                                       DILATION AND CURETTAGE OF UTERUS                              ANTERIOR CERVICAL DECOMP/DISCECTOMY FUSION      N/A 06/25/2013      Comment:Procedure: ANTERIOR CERVICAL  DECOMPRESSION/DISCECTOMY FUSION 2 LEVELS LEVELS              C FOUR/FIVE, SIX/SEVEN;  Surgeon: Floyce Stakes, MD;  Location: [facility];  Service:               Neurosurgery;  Laterality: N/A;   Reproductive/Obstetrics                             Anesthesia Physical Anesthesia Plan  ASA: III  Anesthesia Plan: General   Post-op Pain Management:    Induction:   Airway Management Planned:   Additional Equipment:   Intra-op Plan:   Post-operative Plan:   Informed Consent: I have reviewed the patients History and Physical, chart, labs and discussed the procedure including the risks, benefits and alternatives for the proposed anesthesia with the patient or authorized representative who has indicated his/her understanding and acceptance.   Dental Advisory Given  Plan Discussed with: CRNA  Anesthesia Plan Comments:         Anesthesia Quick Evaluation

## 2015-11-18 ENCOUNTER — Encounter: Payer: Medicare Other | Admitting: Physical Medicine & Rehabilitation

## 2015-11-18 ENCOUNTER — Encounter: Payer: Self-pay | Admitting: Gastroenterology

## 2015-11-19 ENCOUNTER — Encounter: Payer: Self-pay | Admitting: Physical Medicine & Rehabilitation

## 2015-11-19 ENCOUNTER — Encounter: Payer: Medicare Other | Attending: Physical Medicine & Rehabilitation | Admitting: Physical Medicine & Rehabilitation

## 2015-11-19 VITALS — BP 114/61 | HR 70 | Resp 14

## 2015-11-19 DIAGNOSIS — Z86711 Personal history of pulmonary embolism: Secondary | ICD-10-CM | POA: Insufficient documentation

## 2015-11-19 DIAGNOSIS — M609 Myositis, unspecified: Secondary | ICD-10-CM

## 2015-11-19 DIAGNOSIS — K219 Gastro-esophageal reflux disease without esophagitis: Secondary | ICD-10-CM | POA: Insufficient documentation

## 2015-11-19 DIAGNOSIS — F419 Anxiety disorder, unspecified: Secondary | ICD-10-CM | POA: Diagnosis not present

## 2015-11-19 DIAGNOSIS — G4700 Insomnia, unspecified: Secondary | ICD-10-CM | POA: Diagnosis not present

## 2015-11-19 DIAGNOSIS — I10 Essential (primary) hypertension: Secondary | ICD-10-CM | POA: Diagnosis not present

## 2015-11-19 DIAGNOSIS — G8929 Other chronic pain: Secondary | ICD-10-CM | POA: Diagnosis not present

## 2015-11-19 DIAGNOSIS — Z7901 Long term (current) use of anticoagulants: Secondary | ICD-10-CM | POA: Insufficient documentation

## 2015-11-19 DIAGNOSIS — G729 Myopathy, unspecified: Secondary | ICD-10-CM | POA: Insufficient documentation

## 2015-11-19 DIAGNOSIS — I739 Peripheral vascular disease, unspecified: Secondary | ICD-10-CM | POA: Diagnosis not present

## 2015-11-19 DIAGNOSIS — G4733 Obstructive sleep apnea (adult) (pediatric): Secondary | ICD-10-CM | POA: Diagnosis not present

## 2015-11-19 DIAGNOSIS — M792 Neuralgia and neuritis, unspecified: Secondary | ICD-10-CM | POA: Diagnosis not present

## 2015-11-19 DIAGNOSIS — Z8249 Family history of ischemic heart disease and other diseases of the circulatory system: Secondary | ICD-10-CM | POA: Insufficient documentation

## 2015-11-19 DIAGNOSIS — IMO0001 Reserved for inherently not codable concepts without codable children: Secondary | ICD-10-CM

## 2015-11-19 DIAGNOSIS — Z79899 Other long term (current) drug therapy: Secondary | ICD-10-CM | POA: Insufficient documentation

## 2015-11-19 DIAGNOSIS — Z7982 Long term (current) use of aspirin: Secondary | ICD-10-CM | POA: Diagnosis not present

## 2015-11-19 DIAGNOSIS — Z87891 Personal history of nicotine dependence: Secondary | ICD-10-CM | POA: Diagnosis not present

## 2015-11-19 DIAGNOSIS — R0789 Other chest pain: Secondary | ICD-10-CM | POA: Insufficient documentation

## 2015-11-19 DIAGNOSIS — R269 Unspecified abnormalities of gait and mobility: Secondary | ICD-10-CM

## 2015-11-19 DIAGNOSIS — M542 Cervicalgia: Secondary | ICD-10-CM | POA: Diagnosis not present

## 2015-11-19 DIAGNOSIS — Z9181 History of falling: Secondary | ICD-10-CM | POA: Diagnosis not present

## 2015-11-19 DIAGNOSIS — Z6841 Body Mass Index (BMI) 40.0 and over, adult: Secondary | ICD-10-CM | POA: Insufficient documentation

## 2015-11-19 DIAGNOSIS — G894 Chronic pain syndrome: Secondary | ICD-10-CM | POA: Insufficient documentation

## 2015-11-19 DIAGNOSIS — F329 Major depressive disorder, single episode, unspecified: Secondary | ICD-10-CM | POA: Insufficient documentation

## 2015-11-19 DIAGNOSIS — M791 Myalgia: Secondary | ICD-10-CM | POA: Diagnosis not present

## 2015-11-19 DIAGNOSIS — F32A Depression, unspecified: Secondary | ICD-10-CM

## 2015-11-19 DIAGNOSIS — R42 Dizziness and giddiness: Secondary | ICD-10-CM | POA: Insufficient documentation

## 2015-11-19 MED ORDER — TRAMADOL HCL 50 MG PO TABS: 50.0000 mg | ORAL_TABLET | Freq: Three times a day (TID) | ORAL | Status: DC | PRN

## 2015-11-19 MED ORDER — TRAZODONE HCL 100 MG PO TABS: 50.0000 mg | ORAL_TABLET | Freq: Every day | ORAL | Status: DC

## 2015-11-19 NOTE — Progress Notes (Signed)
Subjective:    Patient ID: [name], female    DOB: 03/16/1966, 50 y.o.   MRN: [id number]  HPI 50 y/o female with pmh HTN, depression, anxiety, seizures presents for follow up of neck pain.  Pt had lumbar surgery in 2009 and neck surgery in 2014.  Since that time the pt has been having in pain in her left neck (near the incision site).  She has associated cramping.   ROM of left should exacerbates the pain. Laying in pt's right side improves the pain.  The pain is sharp.  It is constant.  It occasionally radiates down her dorsal arm.  The pt is on disability.    Last clinic visit 07/02/15. Pain is 8/10 today. Since last visit, she had an EGD that revealed a hernia, she is awaiting further treatment.  She had PT with good benefit.  She continues with HEP.  She continues to get massages, which help.  She tried TENS with "great" benefit and was awaiting a call, so she may receive one at home.  The Elavil makes her too drowsy. Pt states she never received a phone call (this is the second time) for breast reduction.  She had a fall 2 months ago.    Pain Inventory Average Pain 10 Pain Right Now 8 My pain is tingling  In the last 24 hours, has pain interfered with the following? General activity 8 Relation with others 10 Enjoyment of life 10 What TIME of day is your pain at its worst? all Sleep (in general) Poor  Pain is worse with: walking and bending Pain improves with: nothing Relief from Meds: 0  Mobility walk with assistance use a cane how many minutes can you walk? unlimited ability to climb steps?  yes do you drive?  yes  Function disabled: date disabled . I need assistance with the following:  dressing, bathing, toileting and household duties Do you have any goals in this area?  yes  Neuro/Psych numbness tingling trouble walking spasms  Prior Studies Any changes since last visit?  no  Physicians involved in your care Any changes since last visit?   no   Family History  Problem Relation Age of Onset  . Long QT syndrome Mother   . CAD Mother   . Diabetes Father   . CAD Father   . Diabetes Brother   . Breast cancer Maternal Aunt 70  . Breast cancer Paternal Aunt   . Breast cancer Maternal Aunt 74   Social History   Social History  . Marital Status: Single    Spouse Name: N/A  . Number of Children: N/A  . Years of Education: N/A   Occupational History  . disabled    Social History Main Topics  . Smoking status: Former Smoker    Quit date: 08/16/2001  . Smokeless tobacco: Never Used  . Alcohol Use: 1.2 oz/week    2 Standard drinks or equivalent per week  . Drug Use: No  . Sexual Activity: No   Other Topics Concern  . None   Social History Narrative   Past Surgical History  Procedure Laterality Date  . Total hip arthroplasty    . Lumbar disc surgery    . Left foot surgery    . Left hand surgery x 2    . Right hand surgery for torn ligament    . Colonoscopy    . Cardiac catheterization    . Dilation and curettage of uterus    .  Anterior cervical decomp/discectomy fusion N/A 06/25/2013    Procedure: ANTERIOR CERVICAL DECOMPRESSION/DISCECTOMY FUSION 2 LEVELS LEVELS C FOUR/FIVE, SIX/SEVEN;  Surgeon: Floyce Stakes, MD;  Location: [facility];  Service: Neurosurgery;  Laterality: N/A;  . Esophagogastroduodenoscopy (egd) with propofol N/A 11/17/2015    Procedure: ESOPHAGOGASTRODUODENOSCOPY (EGD) WITH PROPOFOL;  Surgeon: Lollie Sails, MD;  Location: [facility] ENDOSCOPY;  Service: Endoscopy;  Laterality: N/A;   Past Medical History  Diagnosis Date  . Prolonged QT interval     gene positive  . HTN (hypertension)   . Atypical chest pain     negative Myoview in 2011 with no ischemi and normal LV function.   . Obesity   . OSA (obstructive sleep apnea)     uses C-PAP  . Anxiety   . Depression   . Peripheral vascular disease ([place])     blood clot in right leg  . GERD (gastroesophageal reflux disease)     uses  Nexium  . Seizures ([place])     hasn't had any for 4 years  . Arthritis     osteoarthritis  . Anemia   . DVT (deep venous thrombosis) ([place])   . PE (pulmonary embolism)    BP 114/61 mmHg  Pulse 70  Resp 14  SpO2 97%  Opioid Risk Score:   Fall Risk Score:  `1  Depression screen PHQ 2/9  Depression screen PHQ 2/9 04/30/2015  Decreased Interest 1  Down, Depressed, Hopeless 0  PHQ - 2 Score 1     Review of Systems  Constitutional: Negative.   HENT: Negative.   Eyes: Negative.   Respiratory: Positive for apnea.   Cardiovascular: Negative.   Gastrointestinal: Positive for nausea.  Endocrine: Negative.   Genitourinary: Negative.   Musculoskeletal: Positive for gait problem.  Skin: Negative.   Allergic/Immunologic: Negative.   Neurological: Positive for numbness.       Tingling   Hematological: Negative.   Psychiatric/Behavioral: The patient is nervous/anxious.   All other systems reviewed and are negative.     Objective:   Physical Exam  Gen: NAD. Vita signs reviewed. Obese HENT: Normocephalic. Surgical scar on left anterior neck. Eyes: EOMI, Conj WNL Cardio: S1, S2 normal, RRR Pulm: B/l clear to auscultation.  Effort normal Abd: Soft, non-distended, non-tender, BS+ MSK:   Gait Antalgic.               +TTP posterior neck and traps.                No edema.               Neg Spurling's b/l.             Decreased ROM in neck on left lateral deviation secondary to pain. Neuro: CN II-XII grossly intact.                Sensation intact to light touch in all UE dermatomes             Reflexes 2+ throughout             Strength          5/5 in all UE myotomes Skin: Surgical scar left anterior neck    Assessment & Plan:  50 y/o female with pmh HTN, depression, anxiety, seizures presents for follow of neck pain.  1. Chronic pain syndrome - Most pronounced in left anterior neck along surgical site  Completed PT - Cont HEP  Cont massage therapy              Cont follow up for TENS             D/ced robaxin as pt is not finding any benefit.              Will d/c elavil due to lack of efficacy  Will not order Cymbalta as pt currently taking Lexapro             Will refer again (third time) for evaluation for breast reduction surgery  Pt states she cannot make the commute for [employer] at present, information  Will order tramadol 50mg  TID PRN (educated on signs/symptoms of serotoninu syndrome)              2. Myalgia            Completed PT - Cont HEP          Cont massage therapy             Will refer again (third time) for evaluation for breast reduction surgery  Pt not interested in trigger point injections at this time  3. Neuropathic pain             Cont TENS, follow up for home device  4. Insomnia with hx of OSA             Cont CPAP  Trazodone 50 ordered PRN  5. Morbid obesity             Encouraged weight loss             Cont PT  Will refer again for breast reduction surgery  6. Falls             Prescription for quad cane             Encouraged use of cane  7. Depression/Anxiety  Pt currently on Lexapro, would consider change to Cymbalta

## 2015-11-20 ENCOUNTER — Telehealth: Payer: Self-pay | Admitting: Internal Medicine

## 2015-11-20 LAB — SURGICAL PATHOLOGY

## 2015-11-20 NOTE — Telephone Encounter (Signed)
New message      *STAT* If patient is at the pharmacy, call can be transferred to refill team.   1. Which medications need to be refilled? (please list name of each medication and dose if known) Labetalol the pt does not know what strength  2. Which pharmacy/location (including street and city if local pharmacy) is medication to be sent to?[facility] [place]. Do they need a 30 day or 90 day supply? 30 day

## 2015-11-23 ENCOUNTER — Other Ambulatory Visit: Payer: Self-pay | Admitting: *Deleted

## 2015-11-23 MED ORDER — LABETALOL HCL 200 MG PO TABS: 200.0000 mg | ORAL_TABLET | Freq: Two times a day (BID) | ORAL | 0 refills | Status: DC

## 2015-12-01 ENCOUNTER — Emergency Department
Admission: EM | Admit: 2015-12-01 | Discharge: 2015-12-01 | Disposition: A | Discharge status: Home / Self Care | Payer: Medicare Other | Attending: Emergency Medicine | Admitting: Emergency Medicine

## 2015-12-01 ENCOUNTER — Other Ambulatory Visit: Payer: Self-pay | Admitting: Gastroenterology

## 2015-12-01 ENCOUNTER — Encounter: Payer: Self-pay | Admitting: Emergency Medicine

## 2015-12-01 ENCOUNTER — Emergency Department: Payer: Medicare Other

## 2015-12-01 DIAGNOSIS — G8929 Other chronic pain: Secondary | ICD-10-CM | POA: Insufficient documentation

## 2015-12-01 DIAGNOSIS — I10 Essential (primary) hypertension: Secondary | ICD-10-CM | POA: Insufficient documentation

## 2015-12-01 DIAGNOSIS — M549 Dorsalgia, unspecified: Secondary | ICD-10-CM

## 2015-12-01 DIAGNOSIS — Z87891 Personal history of nicotine dependence: Secondary | ICD-10-CM | POA: Diagnosis not present

## 2015-12-01 DIAGNOSIS — R0789 Other chest pain: Secondary | ICD-10-CM | POA: Diagnosis not present

## 2015-12-01 DIAGNOSIS — R1012 Left upper quadrant pain: Secondary | ICD-10-CM | POA: Diagnosis not present

## 2015-12-01 DIAGNOSIS — M5412 Radiculopathy, cervical region: Secondary | ICD-10-CM | POA: Diagnosis not present

## 2015-12-01 DIAGNOSIS — Z7982 Long term (current) use of aspirin: Secondary | ICD-10-CM | POA: Insufficient documentation

## 2015-12-01 DIAGNOSIS — M542 Cervicalgia: Secondary | ICD-10-CM | POA: Diagnosis not present

## 2015-12-01 DIAGNOSIS — M545 Low back pain: Secondary | ICD-10-CM | POA: Diagnosis not present

## 2015-12-01 DIAGNOSIS — R1013 Epigastric pain: Secondary | ICD-10-CM | POA: Diagnosis not present

## 2015-12-01 DIAGNOSIS — I8500 Esophageal varices without bleeding: Secondary | ICD-10-CM | POA: Diagnosis not present

## 2015-12-01 DIAGNOSIS — Z79899 Other long term (current) drug therapy: Secondary | ICD-10-CM | POA: Insufficient documentation

## 2015-12-01 DIAGNOSIS — K219 Gastro-esophageal reflux disease without esophagitis: Secondary | ICD-10-CM | POA: Diagnosis not present

## 2015-12-01 MED ORDER — KETOROLAC TROMETHAMINE 10 MG PO TABS: 10.0000 mg | ORAL_TABLET | Freq: Three times a day (TID) | ORAL | 0 refills | Status: DC

## 2015-12-01 MED ORDER — OXYCODONE-ACETAMINOPHEN 5-325 MG PO TABS: 1.0000 | ORAL_TABLET | Freq: Four times a day (QID) | ORAL | 0 refills | Status: DC | PRN

## 2015-12-01 MED ORDER — DIAZEPAM 5 MG/ML IJ SOLN
10.0000 mg | Freq: Once | INTRAMUSCULAR | Status: AC
  Administered 2015-12-01: 10 mg via INTRAMUSCULAR
  Filled 2015-12-01: qty 2

## 2015-12-01 MED ORDER — KETOROLAC TROMETHAMINE 30 MG/ML IJ SOLN
30.0000 mg | Freq: Once | INTRAMUSCULAR | Status: AC
  Administered 2015-12-01: 30 mg via INTRAMUSCULAR

## 2015-12-01 MED ORDER — HYDROMORPHONE HCL 1 MG/ML IJ SOLN
1.0000 mg | Freq: Once | INTRAMUSCULAR | Status: AC
  Administered 2015-12-01: 1 mg via INTRAMUSCULAR
  Filled 2015-12-01: qty 1

## 2015-12-01 MED ORDER — KETOROLAC TROMETHAMINE 30 MG/ML IJ SOLN
30.0000 mg | Freq: Once | INTRAMUSCULAR | Status: DC
  Filled 2015-12-01: qty 1

## 2015-12-01 MED ORDER — CYCLOBENZAPRINE HCL 5 MG PO TABS: 5.0000 mg | ORAL_TABLET | Freq: Three times a day (TID) | ORAL | 0 refills | Status: DC | PRN

## 2015-12-01 NOTE — ED Triage Notes (Signed)
Reports hx of back surgery, states after working on a sink bending down yesterday neck and back pain are worse

## 2015-12-01 NOTE — ED Notes (Signed)
Soft cervical collar applied per order

## 2015-12-01 NOTE — ED Provider Notes (Signed)
[facility] Emergency Department Provider Note ____________________________________________  Time seen: 1718  I have reviewed the triage vital signs and the nursing notes.  HISTORY  Chief Complaint  Neck Pain  HPI [name] is a 49 y.o. female with a history of chronic neck pain with a pressure many referral, presents to the ED with reports of acute exacerbation of her pain. She describes onset was today, but she notes that yesterday she admittedly was working on a faucet in her house, while lying on her back. She describes reaching up her lower back and buttocks using her legs, and notes that she was using her neck as a fulcrum, and working with her arms extended while doing this plumbing work. She describes delayed onset of increased neck pain with referral into the bilateral shoulders and upper extremities. She has a recent history of a cervical ACDF performed in January 2015 by Dr. Joya Salm. She takes meloxicam currently.   Past Medical History:  Diagnosis Date  . Anemia   . Anxiety   . Arthritis    osteoarthritis  . Atypical chest pain    negative Myoview in 2011 with no ischemi and normal LV function.   . Depression   . DVT (deep venous thrombosis) ([place])   . GERD (gastroesophageal reflux disease)    uses Nexium  . HTN (hypertension)   . Obesity   . OSA (obstructive sleep apnea)    uses C-PAP  . PE (pulmonary embolism)   . Peripheral vascular disease ([place])    blood clot in right leg  . Prolonged QT interval    gene positive  . Seizures ([place])    hasn't had any for 4 years    Patient Active Problem List   Diagnosis Date Noted  . Difficulty hearing 10/05/2014  . 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 Deep vein thrombosis of lower extremity ([place]) 10/05/2014  . Edema extremities 10/05/2014  . Esophagitis, reflux 10/05/2014  . Degenerative arthritis of hip 10/05/2014  . Depression, major, single episode, in partial remission ([place]) 10/05/2014   . Pulmonary infarction ([place]) 10/05/2014  . Idiopathic insomnia 10/05/2014  . Cervical stenosis of spinal canal 06/25/2013  . Postural dizziness 06/22/2012  . Essential hypertension 06/22/2012  . Morbid obesity ([place]) 08/17/2010  . UNSPECIFIED ENDOCRINE DISORDER 05/15/2009  . CHEST PAIN UNSPECIFIED 01/08/2009  . DEPRESSION 08/21/2008  . OSTEOPOROSIS 08/21/2008  . LONG QT SYNDROME 08/21/2008    Past Surgical History:  Procedure Laterality Date  . ANTERIOR CERVICAL DECOMP/DISCECTOMY FUSION N/A 06/25/2013   Procedure: ANTERIOR CERVICAL DECOMPRESSION/DISCECTOMY FUSION 2 LEVELS LEVELS C FOUR/FIVE, SIX/SEVEN;  Surgeon: Floyce Stakes, MD;  Location: [facility] NEURO ORS;  Service: Neurosurgery;  Laterality: N/A;  . CARDIAC CATHETERIZATION    . COLONOSCOPY    . DILATION AND CURETTAGE OF UTERUS    . ESOPHAGOGASTRODUODENOSCOPY (EGD) WITH PROPOFOL N/A 11/17/2015   Procedure: ESOPHAGOGASTRODUODENOSCOPY (EGD) WITH PROPOFOL;  Surgeon: Lollie Sails, MD;  Location: [facility] ENDOSCOPY;  Service: Endoscopy;  Laterality: N/A;  . left foot surgery    . left hand surgery x 2    . LUMBAR DISC SURGERY    . right hand surgery for torn ligament    . TOTAL HIP ARTHROPLASTY      Current Outpatient Rx  . Order #: [id number] Class: Historical Med  . Order #: [id number] Class: Historical Med  . Order #: [id number] Class: Historical Med  . Order #: [id number] Class: Print  . Order #: [id number] Class: Historical Med  . Order #: [id number] Class: Print  .  Order #: [id number] Class: Historical Med  . Order #: [id number] Class: Historical Med  . Order #: [id number] Class: Normal  . Order #: [id number] Class: Historical Med  . Order #: [id number] Class: Historical Med  . Order #: [id number] Class: Print  . Order #: [id number] Class: Normal  . Order #: [id number] Class: Historical Med  . Order #: [id number] Class: Normal  . Order #: [id number] Class: Historical Med  . Order #: [id number] Class: Print  . Order #: [id number] Class: Normal  . Order #:  [id number] Class: Normal  . Order #: [id number] Class: Print  . Order #: [id number] Class: Normal  . Order #: [id number] Class: Historical Med  . Order #: [id number] Class: Historical Med    Allergies Review of patient's allergies indicates no known allergies.  Family History  Problem Relation Age of Onset  . Long QT syndrome Mother   . CAD Mother   . Diabetes Father   . CAD Father   . Diabetes Brother   . Breast cancer Maternal Aunt 70  . Breast cancer Paternal Aunt   . Breast cancer Maternal Aunt 21    Social History Social History  Substance Use Topics  . Smoking status: Former Smoker    Quit date: 08/16/2001  . Smokeless tobacco: Never Used  . Alcohol use 1.2 oz/week    2 Standard drinks or equivalent per week    Review of Systems  Constitutional: Negative for fever. Musculoskeletal: Negative for back pain. Reports neck pain with UE referral as above Skin: Negative for rash. Neurological: Negative for headaches, focal weakness or numbness. UE paresthesias  ____________________________________________  PHYSICAL EXAM:  VITAL SIGNS: ED Triage Vitals  Enc Vitals Group     BP 12/01/15 1605 134/71     Pulse Rate 12/01/15 1605 61     Resp 12/01/15 1605 18     Temp 12/01/15 1605 98 F (36.7 C)     Temp Source 12/01/15 1605 Oral     SpO2 12/01/15 1605 100 %     Weight 12/01/15 1605 290 lb (131.5 kg)     Height 12/01/15 1605 5\' 4"  (1.626 m)     Head Circumference --      Peak Flow --      Pain Score 12/01/15 1612 9     Pain Loc --      Pain Edu? --      Excl. in [place]? --    Constitutional: Alert and oriented. Well appearing and in no distress. Head: Normocephalic and atraumatic. Cardiovascular: Normal rate, regular rhythm.  Respiratory: Normal respiratory effort. No wheezes/rales/rhonchi. Gastrointestinal: Soft and nontender. No distention. Musculoskeletal: Normal spinal alignment. Nontender with normal range of motion in all extremities.  Neurologic:  Normal UE DTRs  bilaterally. Normal gait without ataxia. Normal speech and language. No gross focal neurologic deficits are appreciated. Skin:  Skin is warm, dry and intact. No rash noted. ____________________________________________   RADIOLOGY  CT Cervical Spine  IMPRESSION: 1. No fracture or static subluxation of the cervical spine. 2. Straightening of the expected cervical lordosis, nonspecific though could be seen in the setting of muscle spasm. Otherwise, no acute findings. 3. Post C4-C5 and C6-C7 ACDF and intervertebral disc space replacement without evidence of hardware failure or loosening. 4. Moderate multilevel DDD within the remainder of the cervical spine, worse at C3-C4 and C5-C6, likely progressed since the 06/2014 examination. ____________________________________________  PROCEDURES  Toradol 30 mg IM Valium 10 mg IM Dilaudid 1 mg IM Soft cervical collar ____________________________________________  INITIAL IMPRESSION / ASSESSMENT AND PLAN / ED COURSE  Patient with what appears  to be an acute exacerbation of her chronic neck pain. Her CT scan does not reveal any acute findings. Patient is advised to follow-up with Dr. Joya Salm for ongoing symptom management. She initially reported pain at a 12 on arrival, she now reports the pain at a 9 on discharge. She will be discharged with instructions for Percocet and Flexeril and Toradol. Return precautions were reviewed.  Clinical Course   ____________________________________________  FINAL CLINICAL IMPRESSION(S) / ED DIAGNOSES  Final diagnoses:  Cervical radiculopathy  Exacerbation of chronic back pain     Melvenia Needles, PA-C 12/02/15 0009    Delman Kitten, MD 12/02/15 [phone]

## 2015-12-01 NOTE — ED Notes (Signed)
States she was trying to work on faucet yesterday developed pain to right side of neck which radiates into right shoulder  States she has hx of neck/back surgery in past

## 2015-12-01 NOTE — ED Notes (Signed)
Back from x-ray  States no pain relief   PA aware

## 2015-12-01 NOTE — ED Notes (Signed)
Pt had to leave due to caregiver is the only way home. Pt states pain isnt better. Berniece Salines PA state pain is better when she came in report pain at 12/10. Now pt state pain is 9/10.

## 2015-12-01 NOTE — ED Notes (Signed)
Discharge instructions reviewed with patient. Questions fielded by this RN. Patient verbalizes understanding of instructions. Patient discharged home in stable condition per Bacon PA. No acute distress noted at time of discharge.   

## 2015-12-01 NOTE — Discharge Instructions (Signed)
Take the prescription meds as directed. Follow-up with Dr. Joya Salm as discussed.

## 2015-12-04 ENCOUNTER — Ambulatory Visit: Admission: RE | Admit: 2015-12-04 | Payer: Medicare Other | Source: Ambulatory Visit

## 2015-12-10 DIAGNOSIS — M542 Cervicalgia: Secondary | ICD-10-CM | POA: Diagnosis not present

## 2015-12-10 DIAGNOSIS — Z6841 Body Mass Index (BMI) 40.0 and over, adult: Secondary | ICD-10-CM | POA: Diagnosis not present

## 2015-12-14 ENCOUNTER — Ambulatory Visit: Payer: Medicare Other

## 2015-12-17 ENCOUNTER — Ambulatory Visit
Admission: RE | Admit: 2015-12-17 | Discharge: 2015-12-17 | Disposition: A | Discharge status: Home / Self Care | Payer: Medicare Other | Source: Ambulatory Visit | Attending: Gastroenterology | Admitting: Gastroenterology

## 2015-12-17 DIAGNOSIS — R0789 Other chest pain: Secondary | ICD-10-CM | POA: Diagnosis not present

## 2015-12-17 DIAGNOSIS — R1012 Left upper quadrant pain: Secondary | ICD-10-CM | POA: Diagnosis not present

## 2015-12-17 MED ORDER — IOPAMIDOL (ISOVUE-300) INJECTION 61%
100.0000 mL | Freq: Once | INTRAVENOUS | Status: AC | PRN
  Administered 2015-12-17: 100 mL via INTRAVENOUS

## 2015-12-29 ENCOUNTER — Ambulatory Visit: Payer: Medicare Other

## 2015-12-31 ENCOUNTER — Encounter: Payer: Medicare Other | Attending: Physical Medicine & Rehabilitation | Admitting: Physical Medicine & Rehabilitation

## 2015-12-31 ENCOUNTER — Encounter: Payer: Self-pay | Admitting: Physical Medicine & Rehabilitation

## 2015-12-31 VITALS — BP 124/82 | HR 72 | Resp 16

## 2015-12-31 DIAGNOSIS — Z79899 Other long term (current) drug therapy: Secondary | ICD-10-CM | POA: Diagnosis not present

## 2015-12-31 DIAGNOSIS — F419 Anxiety disorder, unspecified: Secondary | ICD-10-CM | POA: Diagnosis not present

## 2015-12-31 DIAGNOSIS — IMO0001 Reserved for inherently not codable concepts without codable children: Secondary | ICD-10-CM

## 2015-12-31 DIAGNOSIS — G4700 Insomnia, unspecified: Secondary | ICD-10-CM | POA: Insufficient documentation

## 2015-12-31 DIAGNOSIS — Z8249 Family history of ischemic heart disease and other diseases of the circulatory system: Secondary | ICD-10-CM | POA: Insufficient documentation

## 2015-12-31 DIAGNOSIS — K219 Gastro-esophageal reflux disease without esophagitis: Secondary | ICD-10-CM | POA: Diagnosis not present

## 2015-12-31 DIAGNOSIS — R269 Unspecified abnormalities of gait and mobility: Secondary | ICD-10-CM | POA: Diagnosis not present

## 2015-12-31 DIAGNOSIS — R0789 Other chest pain: Secondary | ICD-10-CM | POA: Diagnosis not present

## 2015-12-31 DIAGNOSIS — Z7901 Long term (current) use of anticoagulants: Secondary | ICD-10-CM | POA: Insufficient documentation

## 2015-12-31 DIAGNOSIS — M792 Neuralgia and neuritis, unspecified: Secondary | ICD-10-CM | POA: Insufficient documentation

## 2015-12-31 DIAGNOSIS — R42 Dizziness and giddiness: Secondary | ICD-10-CM | POA: Insufficient documentation

## 2015-12-31 DIAGNOSIS — I739 Peripheral vascular disease, unspecified: Secondary | ICD-10-CM | POA: Insufficient documentation

## 2015-12-31 DIAGNOSIS — G4733 Obstructive sleep apnea (adult) (pediatric): Secondary | ICD-10-CM | POA: Diagnosis not present

## 2015-12-31 DIAGNOSIS — Z86711 Personal history of pulmonary embolism: Secondary | ICD-10-CM | POA: Insufficient documentation

## 2015-12-31 DIAGNOSIS — M542 Cervicalgia: Secondary | ICD-10-CM

## 2015-12-31 DIAGNOSIS — G729 Myopathy, unspecified: Secondary | ICD-10-CM | POA: Diagnosis not present

## 2015-12-31 DIAGNOSIS — G479 Sleep disorder, unspecified: Secondary | ICD-10-CM

## 2015-12-31 DIAGNOSIS — G8929 Other chronic pain: Secondary | ICD-10-CM | POA: Diagnosis not present

## 2015-12-31 DIAGNOSIS — Z7982 Long term (current) use of aspirin: Secondary | ICD-10-CM | POA: Insufficient documentation

## 2015-12-31 DIAGNOSIS — G894 Chronic pain syndrome: Secondary | ICD-10-CM | POA: Diagnosis not present

## 2015-12-31 DIAGNOSIS — M609 Myositis, unspecified: Secondary | ICD-10-CM

## 2015-12-31 DIAGNOSIS — Z6841 Body Mass Index (BMI) 40.0 and over, adult: Secondary | ICD-10-CM | POA: Insufficient documentation

## 2015-12-31 DIAGNOSIS — F329 Major depressive disorder, single episode, unspecified: Secondary | ICD-10-CM | POA: Diagnosis not present

## 2015-12-31 DIAGNOSIS — F32A Depression, unspecified: Secondary | ICD-10-CM

## 2015-12-31 DIAGNOSIS — Z87891 Personal history of nicotine dependence: Secondary | ICD-10-CM | POA: Insufficient documentation

## 2015-12-31 DIAGNOSIS — M791 Myalgia: Secondary | ICD-10-CM | POA: Diagnosis not present

## 2015-12-31 DIAGNOSIS — Z9181 History of falling: Secondary | ICD-10-CM | POA: Insufficient documentation

## 2015-12-31 DIAGNOSIS — W19XXXD Unspecified fall, subsequent encounter: Secondary | ICD-10-CM

## 2015-12-31 DIAGNOSIS — I10 Essential (primary) hypertension: Secondary | ICD-10-CM | POA: Insufficient documentation

## 2015-12-31 MED ORDER — BACLOFEN 10 MG PO TABS: 10.0000 mg | ORAL_TABLET | Freq: Three times a day (TID) | ORAL | 0 refills | Status: DC

## 2015-12-31 MED ORDER — MELOXICAM 15 MG PO TABS: 15.0000 mg | ORAL_TABLET | Freq: Every day | ORAL | 1 refills | Status: DC

## 2015-12-31 NOTE — Progress Notes (Addendum)
Subjective:    Patient ID: [name], female    DOB: 04/01/1966, 50 y.o.   MRN: [id number]  HPI  50 y/o female with pmh HTN, depression, anxiety, seizures presents for follow up of neck pain.  Pt had lumbar surgery in 2009 and neck surgery in 2014.  Since that time the pt has been having in pain in her left neck (near the incision site).  She has associated cramping.   ROM of left should exacerbates the pain. Laying in pt's right side improves the pain.  The pain is sharp.  It is constant.  It occasionally radiates down her dorsal arm.  The pt is on disability.    Last clinic visit 11/19/15. Pain is 7/10 today. Since last visit, she went to ED after hurting her neck after tring to bend down and fix a broken faucet under her sink.  She notes she saw Dr. Joya Salm and he placed pt in a traction device, which did not help.  She was also prescribed percocet, which helps. She was also being evaluated for a hernia repair, she had an EGD, but no plans as of yet for hernia repair.  She continues HEP.  She is no longer getting massages.  She still has not received her home TENS unit. She is awaiting a call for evaluation for breast reduction surgery.  She had a fall couple of weeks.  Tramadol was not effective for patient. Trazodone is helping with sleep, but causing her drowsiness in the morning.  Overall, she states she is doing fair  Pain Inventory Average Pain 7 Pain Right Now 7 My pain is sharp, burning, tingling and aching  In the last 24 hours, has pain interfered with the following? General activity 9 Relation with others 9 Enjoyment of life 9 What TIME of day is your pain at its worst? all Sleep (in general) Poor  Pain is worse with: walking, bending and sitting Pain improves with: nothing Relief from Meds: 2  Mobility walk with assistance use a cane how many minutes can you walk? 15 ability to climb steps?  no do you drive?  yes  Function I need assistance with the following:   dressing, bathing, toileting and household duties Do you have any goals in this area?  yes  Neuro/Psych trouble walking  Prior Studies Any changes since last visit?  no  Physicians involved in your care Any changes since last visit?  yes Neurosurgeon saw Dr Joya Salm 2 weeks ago  Was given a traction kit   Family History  Problem Relation Age of Onset  . Long QT syndrome Mother   . CAD Mother   . Diabetes Father   . CAD Father   . Diabetes Brother   . Breast cancer Maternal Aunt 70  . Breast cancer Paternal Aunt   . Breast cancer Maternal Aunt 54   Social History   Social History  . Marital status: Single    Spouse name: N/A  . Number of children: N/A  . Years of education: N/A   Occupational History  . disabled    Social History Main Topics  . Smoking status: Former Smoker    Quit date: 08/16/2001  . Smokeless tobacco: Never Used  . Alcohol use 1.2 oz/week    2 Standard drinks or equivalent per week  . Drug use: No  . Sexual activity: No   Other Topics Concern  . None   Social History Narrative  . None   Past Surgical  History:  Procedure Laterality Date  . ANTERIOR CERVICAL DECOMP/DISCECTOMY FUSION N/A 06/25/2013   Procedure: ANTERIOR CERVICAL DECOMPRESSION/DISCECTOMY FUSION 2 LEVELS LEVELS C FOUR/FIVE, SIX/SEVEN;  Surgeon: Floyce Stakes, MD;  Location: [facility] NEURO ORS;  Service: Neurosurgery;  Laterality: N/A;  . CARDIAC CATHETERIZATION    . COLONOSCOPY    . DILATION AND CURETTAGE OF UTERUS    . ESOPHAGOGASTRODUODENOSCOPY (EGD) WITH PROPOFOL N/A 11/17/2015   Procedure: ESOPHAGOGASTRODUODENOSCOPY (EGD) WITH PROPOFOL;  Surgeon: Lollie Sails, MD;  Location: [facility] ENDOSCOPY;  Service: Endoscopy;  Laterality: N/A;  . left foot surgery    . left hand surgery x 2    . LUMBAR DISC SURGERY    . right hand surgery for torn ligament    . TOTAL HIP ARTHROPLASTY     Past Medical History:  Diagnosis Date  . Anemia   . Anxiety   . Arthritis    osteoarthritis    . Atypical chest pain    negative Myoview in 2011 with no ischemi and normal LV function.   . Depression   . DVT (deep venous thrombosis) ([place])   . GERD (gastroesophageal reflux disease)    uses Nexium  . HTN (hypertension)   . Obesity   . OSA (obstructive sleep apnea)    uses C-PAP  . PE (pulmonary embolism)   . Peripheral vascular disease ([place])    blood clot in right leg  . Prolonged QT interval    gene positive  . Seizures ([place])    hasn't had any for 4 years   BP 124/82 (BP Location: Left Arm, Patient Position: Sitting, Cuff Size: Large)   Pulse 72   Resp 16   SpO2 97%   Opioid Risk Score:   Fall Risk Score:  `1  Depression screen PHQ 2/9  Depression screen [facility] 2/9 12/31/2015 04/30/2015  Decreased Interest 0 1  Down, Depressed, Hopeless 0 0  PHQ - 2 Score 0 1     Review of Systems  Constitutional: Negative.   HENT: Negative.   Eyes: Negative.   Respiratory: Positive for apnea.   Cardiovascular: Negative.   Gastrointestinal: Positive for nausea.  Endocrine: Negative.   Genitourinary: Negative.   Musculoskeletal: Positive for gait problem.  Skin: Negative.   Allergic/Immunologic: Negative.   Neurological: Positive for numbness.       Tingling   Hematological: Negative.   Psychiatric/Behavioral: The patient is nervous/anxious.   All other systems reviewed and are negative.     Objective:   Physical Exam Gen: NAD. Vita signs reviewed. Obese HENT: Normocephalic. Surgical scar on left anterior neck. Eyes: EOMI, Conj WNL Cardio: S1, S2 normal, RRR Pulm: B/l clear to auscultation.  Effort normal Abd: Soft, non-distended, non-tender, BS+ MSK:   Gait Antalgic.    No TTP posterior neck and traps.                No edema.               Neg Spurling's b/l.  Mild decrease in ROM in neck on left lateral deviation secondary to pain. Neuro: CN II-XII grossly intact.                Sensation intact to light touch in all UE dermatomes             Reflexes 2+  throughout             Strength          5/5 in all UE myotomes  Skin: Surgical scar left anterior neck    Assessment & Plan:  50 y/o female with pmh HTN, depression, anxiety, seizures presents for follow of neck pain.  1. Chronic pain syndrome - Most pronounced in left anterior neck along surgical site  Completed PT - Cont HEP             Cont massage therapy  Awaiting home TENS   D/ced elavil due to lack of efficacy  D/ced tramadol due to lack of efficacy  Will not order Cymbalta as pt currently taking Lexapro  Pt states she cannot make the commute for [employer] at present  Pt being prescribed Percocet by Dr. Joya Salm  Awaiting appointment for breast reduction surgery  Will order Baclofen 12m TID  Trial of Mobic 121mdaily with food              2. Myalgia            Completed PT - Cont HEP          Cont massage therapy  Pt not interested in trigger point injections at this time and has minimal TTP  Awaiting appointment for breast reduction surgery  3. Neuropathic pain  Failed Gabapentin             Cont TENS, awaiting home device  4. Insomnia with hx of OSA             Cont CPAP  Decrease trazodone to 25 PRN  5. Morbid obesity             Encouraged weight loss             Cont HEP  Await breast reduction surgery eval  6. Falls  Stubborn to use cane, she has quad cane, stressed importance of use to avoid injury  7. Depression/Anxiety  Pt currently on Lexapro, would consider change to Cymbalta

## 2015-12-31 NOTE — Addendum Note (Signed)
Addended by: Delice Lesch A on: 12/31/2015 12:02 PM   Modules accepted: Orders

## 2015-12-31 NOTE — Patient Instructions (Signed)
Use quad cane for ambulation  Follow up for breast reduction evaluation

## 2016-01-01 DIAGNOSIS — F3340 Major depressive disorder, recurrent, in remission, unspecified: Secondary | ICD-10-CM | POA: Diagnosis not present

## 2016-01-01 DIAGNOSIS — Z6841 Body Mass Index (BMI) 40.0 and over, adult: Secondary | ICD-10-CM | POA: Diagnosis not present

## 2016-01-01 DIAGNOSIS — D509 Iron deficiency anemia, unspecified: Secondary | ICD-10-CM | POA: Diagnosis not present

## 2016-01-01 DIAGNOSIS — I519 Heart disease, unspecified: Secondary | ICD-10-CM | POA: Diagnosis not present

## 2016-01-01 DIAGNOSIS — G4700 Insomnia, unspecified: Secondary | ICD-10-CM | POA: Diagnosis not present

## 2016-01-01 DIAGNOSIS — M542 Cervicalgia: Secondary | ICD-10-CM | POA: Diagnosis not present

## 2016-01-01 DIAGNOSIS — I10 Essential (primary) hypertension: Secondary | ICD-10-CM | POA: Diagnosis not present

## 2016-01-01 DIAGNOSIS — K219 Gastro-esophageal reflux disease without esophagitis: Secondary | ICD-10-CM | POA: Diagnosis not present

## 2016-01-20 ENCOUNTER — Other Ambulatory Visit: Payer: Self-pay | Admitting: *Deleted

## 2016-01-20 MED ORDER — LABETALOL HCL 200 MG PO TABS: 200.0000 mg | ORAL_TABLET | Freq: Two times a day (BID) | ORAL | 2 refills | Status: DC

## 2016-02-08 ENCOUNTER — Telehealth: Payer: Self-pay | Admitting: Physical Medicine & Rehabilitation

## 2016-02-08 NOTE — Telephone Encounter (Signed)
Contacted Becky at Dr. Harley Hallmark office and informed that pt is being prescribed tramadol, mobic, and trazadone. No schedule 2 narcotics.

## 2016-02-08 NOTE — Telephone Encounter (Signed)
Becky with Dr. [facility] Hallmark office needs a call back, needing to know if we are prescribing patient any medication.  Please call her at [phone] and press 0 let the operator know that we are returning her call.

## 2016-02-23 ENCOUNTER — Other Ambulatory Visit: Payer: Self-pay | Admitting: Internal Medicine

## 2016-02-23 MED ORDER — NABUMETONE 750 MG PO TABS: 750.0000 mg | ORAL_TABLET | Freq: Two times a day (BID) | ORAL | 5 refills | Status: DC

## 2016-02-25 DIAGNOSIS — G4733 Obstructive sleep apnea (adult) (pediatric): Secondary | ICD-10-CM | POA: Diagnosis not present

## 2016-02-25 DIAGNOSIS — Z6841 Body Mass Index (BMI) 40.0 and over, adult: Secondary | ICD-10-CM | POA: Diagnosis not present

## 2016-03-02 ENCOUNTER — Telehealth: Payer: Self-pay | Admitting: Physical Medicine & Rehabilitation

## 2016-03-02 DIAGNOSIS — M793 Panniculitis, unspecified: Secondary | ICD-10-CM | POA: Diagnosis not present

## 2016-03-02 NOTE — Telephone Encounter (Signed)
patient was referred for a breast reduction - dr Towanda Malkin Licensed conveyancer) does not feel comfortable putting the patient under due to weight - would like dr patel to put patient on diet pill to facilitate in losing 50lbs .  dr Towanda Malkin will be faxing some paperwork over.

## 2016-03-12 NOTE — Progress Notes (Signed)
Cardiology Office Note Date:  03/14/2016  Patient ID:  [name] April 16, 1966, MRN [id number] PCP:  [name], NP  Cardiologist:  Dr. Caryl Comes   Chief Complaint: CP, SOB  History of Present Illness: [name] is a 50 y.o. 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 seizure d/o, she states this is when she was found with long QT, OSA she reports complaint with CPAP, orthostasis with ocassional positional lightheadedness, no near syncope or syncopal events, morbid obesity, chronic neck pain  She last saw Dr. Caryl Comes April 2016, at that time, he mentions hx of atypical CP/SOB with a negative w/u in 2011, he discontinued her Maxide with hx of orthostais and concerns of hypokalemia given long QT, though was doing well.  She reports today coming in because she feels like she is getting SOB easier with exertion.  She denies exertional CP, palpitations, no rest SOB, no symptoms of PND or orthopnea.  She feels SOB when bending to put her shoes on, this also causes discomfort in her upper abdomen, lower chest that she states has been found to be secondary to a sliding hernia.  In review of her notes, there is mention of historically having CP/SOB, she can not  Recall any specifics if this is the same or different in any way.  She says she had been walking for exercising and throwing the football around with her son without any particular SOB, though had stopped for about 3 weeks because she had exacerbated her chronic neck and back pain and a few weeks ago went walking and noted that she felt unusually SOB with exertion. Resolves with rest, her inhaler does not seem to help.  She continues to have orthostatic dizziness after she has been sitting for about 1/2 hour will feel lightheaded "swimmy" in the head for a couple minutes, as well as if she has prolonged periods of standing, this is unchanged now for years.  She has no other dizziness,  no syncope.  No symptos of orthopnea or PND are described  She has not had any kind of falls, injury, or trauma of any kind, noting hx of PE that was provoked by LE injury/DVT in 2015, she reports taking the Eliquis for several months and thinks she was told she was to stop it quite a while ago.  Past Medical History:  Diagnosis Date  . Anemia   . Anxiety   . Arthritis    osteoarthritis  . Atypical chest pain    negative Myoview in 2011 with no ischemi and normal LV function.   . Depression   . DVT (deep venous thrombosis) ([place])   . GERD (gastroesophageal reflux disease)    uses Nexium  . HTN (hypertension)   . Obesity   . OSA (obstructive sleep apnea)    uses C-PAP  . PE (pulmonary embolism)   . Peripheral vascular disease ([place])    blood clot in right leg  . Prolonged QT interval    gene positive  . Seizures ([place])    hasn't had any for 4 years    Past Surgical History:  Procedure Laterality Date  . ANTERIOR CERVICAL DECOMP/DISCECTOMY FUSION N/A 06/25/2013   Procedure: ANTERIOR CERVICAL DECOMPRESSION/DISCECTOMY FUSION 2 LEVELS LEVELS C FOUR/FIVE, SIX/SEVEN;  Surgeon: Floyce Stakes, MD;  Location: [facility]URO [facility];  Service: Neurosurgery;  Laterality: N/A;  . CARDIAC CATHETERIZATION    . COLONOSCOPY    . DILATION AND CURETTAGE  OF UTERUS    . ESOPHAGOGASTRODUODENOSCOPY (EGD) WITH PROPOFOL N/A 11/17/2015   Procedure: ESOPHAGOGASTRODUODENOSCOPY (EGD) WITH PROPOFOL;  Surgeon: Lollie Sails, MD;  Location: [facility] ENDOSCOPY;  Service: Endoscopy;  Laterality: N/A;  . left foot surgery    . left hand surgery x 2    . LUMBAR DISC SURGERY    . right hand surgery for torn ligament    . TOTAL HIP ARTHROPLASTY      Current Outpatient Prescriptions  Medication Sig Dispense Refill  . albuterol (PROVENTIL HFA;VENTOLIN HFA) 108 (90 BASE) MCG/ACT inhaler Inhale 1-2 puffs into the lungs every 4 (four) hours as needed for wheezing or shortness of breath.    Marland Kitchen aspirin 81 MG tablet Take 81 mg  by mouth daily.      . budesonide-formoterol (SYMBICORT) 160-4.5 MCG/ACT inhaler Inhale 2 puffs into the lungs 2 (two) times daily as needed (for shortness of breath).    . escitalopram (LEXAPRO) 20 MG tablet Take 20 mg by mouth daily.     Marland Kitchen esomeprazole (NEXIUM) 40 MG capsule Take 1 capsule (40 mg total) by mouth at bedtime. 10 capsule 0  . Ferrous Sulfate (SLOW RELEASE IRON PO) Take 1 tablet by mouth daily.     . fluocinonide (LIDEX) 0.05 % external solution Apply 1 application topically 2 (two) times daily.    . furosemide (LASIX) 20 MG tablet TAKE (1) TABLET BY MOUTH EVERY DAY 30 tablet 5  . Glucosamine-Chondroit-Vit C-Mn (GLUCOSAMINE 1500 COMPLEX) CAPS Take 1 each by mouth daily.    . hyoscyamine (LEVSIN SL) 0.125 MG SL tablet Take 1 tablet by mouth 2 (two) times daily as needed. For nausea    . labetalol (NORMODYNE) 200 MG tablet Take 1 tablet (200 mg total) by mouth 2 (two) times daily. Please keep 04/20/16 appointment for further refills 60 tablet 2  . Multiple Vitamins-Minerals (MULTIVITAMIN WITH MINERALS) tablet Take 1 tablet by mouth daily.    . nabumetone (RELAFEN) 750 MG tablet Take 1 tablet (750 mg total) by mouth 2 (two) times daily. 60 tablet 5  . Omega-3 Fatty Acids (FISH OIL) 1000 MG CAPS Take 1 capsule by mouth 3 (three) times daily.      . potassium chloride SA (K-DUR,KLOR-CON) 20 MEQ tablet Take 1 tablet (20 mEq total) by mouth daily. 30 tablet 5  . spironolactone (ALDACTONE) 25 MG tablet Take 1 tablet (25 mg total) by mouth daily. 30 tablet 5  . triamterene-hydrochlorothiazide (MAXZIDE) 75-50 MG per tablet Take 1 tablet by mouth daily.    . VOLTAREN 1 % GEL Apply 2 g topically 4 (four) times daily as needed (for pain).      No current facility-administered medications for this visit.     Allergies:   Patient has no known allergies.   Social History:  The patient  reports that she quit smoking about 14 years ago. She has never used smokeless tobacco. She reports that she  drinks about 1.2 oz of alcohol per week . She reports that she does not use drugs.   Family History:  The patient's family history includes Breast cancer in her paternal aunt; Breast cancer (age of onset: 23) in her maternal aunt; Breast cancer (age of onset: 17) in her maternal aunt; CAD in her father and mother; Diabetes in her brother and father; Long QT syndrome in her mother.  ROS:  Please see the history of present illness.  All other systems are reviewed and otherwise negative.   PHYSICAL EXAM:  VS:  BP 118/74   Pulse 67   Ht 5\' 4"  (1.626 m)   Wt 290 lb (131.5 kg)   BMI 49.78 kg/m  BMI: Body mass index is 49.78 kg/m. O2sat on RA is 98% Well nourished, well developed, in no acute distress  HEENT: normocephalic, atraumatic  Neck: no JVD, carotid bruits or masses Cardiac: RRR no significant murmurs, no rubs, or gallops Lungs:  clear to auscultation bilaterally, no wheezing, rhonchi or rales  Abd: soft, nontender MS: no deformity or atrophy Ext: no edema, no calf tenderness or palpable masses to either LE, no skin hanges Skin: warm and dry, no rash Neuro:  No gross deficits appreciated Psych: euthymic mood, full affect   EKG:  Done today and reviewed by myself shows SR, nonspecific changes, no ischemic looking changes, and appears unchanged from previous, QTc 420ms, PR 102, QRS 68ms  12/17/15: CT chest/abdomen w/contrast  IMPRESSION: 1. Negative CT the chest. The pulmonary arteries are faintly opacified with no central abnormality noted 2. Negative CT the abdomen. No explanation for the patient's left upper quadrant pain is seen  Recent Labs: 06/16/2015: ALT 17; BUN 17; Creatinine, Ser 0.88; Hemoglobin 11.9; Platelets 249; Potassium 4.1; Sodium 141  No results found for requested labs within last 8760 hours.   CrCl cannot be calculated (Patient's most recent lab result is older than the maximum 21 days allowed.).   Wt Readings from Last 3 Encounters:  03/14/16 290 lb  (131.5 kg)  12/01/15 290 lb (131.5 kg)  11/17/15 275 lb (124.7 kg)     Other studies reviewed: Additional studies/records reviewed today include: summarized above   ASSESSMENT AND PLAN:  1. Long QT syndrome     Stable, no syncope, on BB  2. DOE     She is a poor historian in regards to previous c/o SOB if this is changed or different Her PE years ago was provoked by LE trauma, she has had no trauma of any kind, and her exam is not suspicious for DVT Could be multifactorial, having held her usual routine of walking for a few weeks with exacerbation of her chronic neck/back pain along with her morbid obesity quickly deconditioned Her pulmonary exam is completely normal and no findings to suggest fluid OL No CP She has an inhaler that she has had Rx for her for some years, but denies any known hx of asthma No cough or symptoms of illness  Will start with an echo, pending this for any further recommendations Will check BMET, mag level   3. Orthostatic dizziness     She is cautioned on standing slowly after sitting for any prolonged period, keeping adequately hydrated, and to sit/lay down immediately if needed with symptoms, she is aware .       Disposition: F/u with Dr. Caryl Comes in 9mo, sooner if needed, and pending her test results as well.  Current medicines are reviewed at length with the patient today.  The patient did not have any concerns regarding medicines.  Haywood Lasso, PA-C 03/14/2016 9:14 AM     [place] [address] [address] [phone] (office)  [phone] (fax)

## 2016-03-14 ENCOUNTER — Ambulatory Visit (INDEPENDENT_AMBULATORY_CARE_PROVIDER_SITE_OTHER): Payer: Medicare Other | Admitting: Physician Assistant

## 2016-03-14 VITALS — BP 118/74 | HR 67 | Ht 64.0 in | Wt 290.0 lb

## 2016-03-14 DIAGNOSIS — R42 Dizziness and giddiness: Secondary | ICD-10-CM | POA: Diagnosis not present

## 2016-03-14 DIAGNOSIS — I4581 Long QT syndrome: Secondary | ICD-10-CM

## 2016-03-14 DIAGNOSIS — R0602 Shortness of breath: Secondary | ICD-10-CM

## 2016-03-14 LAB — MAGNESIUM: Magnesium: 2 mg/dL (ref 1.5–2.5)

## 2016-03-14 LAB — BASIC METABOLIC PANEL
BUN: 20 mg/dL (ref 7–25)
CALCIUM: 9.7 mg/dL (ref 8.6–10.4)
CO2: 28 mmol/L (ref 20–31)
CREATININE: 0.96 mg/dL (ref 0.50–1.05)
Chloride: 101 mmol/L (ref 98–110)
GLUCOSE: 98 mg/dL (ref 65–99)
Potassium: 3.7 mmol/L (ref 3.5–5.3)
Sodium: 138 mmol/L (ref 135–146)

## 2016-03-14 NOTE — Patient Instructions (Addendum)
Medication Instructions:   Your physician recommends that you continue on your current medications as directed. Please refer to the Current Medication list given to you today.     If you need a refill on your cardiac medications before your next appointment, please call your pharmacy.  Labwork: BMET  AND MAG    Testing/Procedures:  Your physician has requested that you have an echocardiogram. Echocardiography is a painless test that uses sound waves to create images of your heart. It provides your doctor with information about the size and shape of your heart and how well your heart's chambers and valves are working. This procedure takes approximately one hour. There are no restrictions for this procedure.   Follow-Up:   3 MONTHS WITH DR Caryl Comes    Any Other Special Instructions Will Be Listed Below (If Applicable).

## 2016-03-15 ENCOUNTER — Telehealth: Payer: Self-pay | Admitting: *Deleted

## 2016-03-15 ENCOUNTER — Telehealth: Payer: Self-pay | Admitting: Physician Assistant

## 2016-03-15 ENCOUNTER — Other Ambulatory Visit: Payer: Self-pay | Admitting: *Deleted

## 2016-03-15 DIAGNOSIS — R0602 Shortness of breath: Secondary | ICD-10-CM

## 2016-03-15 NOTE — Telephone Encounter (Signed)
-----   Message from [facility], [place] sent at 03/15/2016 10:36 AM EST ----- Please let the patient know her lbs look OK.    Thanks [employer]

## 2016-03-15 NOTE — Telephone Encounter (Signed)
SPOKE TO PT ABOUT RESULTS ALSO CXR RECOMMENDED BY [facility] PT WAS PROVIDED WITH LOCATION AND TELEPHONE NUMBER FOR CXR  PT VERBALIZED UNDERSTANDING FOR RESULTS

## 2016-03-15 NOTE — Telephone Encounter (Signed)
Poke to the patient regarding her labs being ok, discussed further her medications and diuretics given her orthostatic hx and symptoms.  She tells me she self discontinued the lasix about 4-6 weeeks ago without any change in her positional dizziness, she is very reluctant about stopping her others, stating her LE swelling gets significant without them.  Since she is off her lasix for several weeks will get a CXR though her exam did not suggest fluid OL.  She is again cautioned when standing and to avoid longer periods of standing to avoid her symptoms, also discussed trying support stockings as an aid to reduce this.  I will ask MA to arrange/scheduled CXR, and will f/u on her echo as well.  Tommye Standard, PA-C

## 2016-03-16 ENCOUNTER — Ambulatory Visit
Admission: RE | Admit: 2016-03-16 | Discharge: 2016-03-16 | Disposition: A | Discharge status: Home / Self Care | Payer: Medicare Other | Source: Ambulatory Visit | Attending: Physician Assistant | Admitting: Physician Assistant

## 2016-03-16 DIAGNOSIS — R0602 Shortness of breath: Secondary | ICD-10-CM

## 2016-03-18 ENCOUNTER — Other Ambulatory Visit: Payer: Self-pay | Admitting: Neurosurgery

## 2016-03-18 DIAGNOSIS — M5412 Radiculopathy, cervical region: Secondary | ICD-10-CM | POA: Diagnosis not present

## 2016-03-25 ENCOUNTER — Encounter (HOSPITAL_COMMUNITY): Payer: Self-pay | Admitting: *Deleted

## 2016-03-25 NOTE — Progress Notes (Signed)
Pt has hx of long QT syndrome and sees Dr. Caryl Comes. Pt states she had a heart cath done "many years ago", I'm unable to find any reports in EPIC. Pt was seen by cardiology PA in last few weeks and was c/o of sob. She has been scheduled for an Echo on 04/07/16. Pt states she is no longer having sob because she has gotten a new CPAP and it's doing much better for her.

## 2016-03-28 ENCOUNTER — Other Ambulatory Visit: Payer: Self-pay | Admitting: Neurosurgery

## 2016-03-28 MED ORDER — DEXTROSE 5 % IV SOLN
3.0000 g | INTRAVENOUS | Status: AC
  Administered 2016-03-29: 3 g via INTRAVENOUS
  Filled 2016-03-28: qty 3000

## 2016-03-28 NOTE — Progress Notes (Signed)
Anesthesia Chart Review:  SAME DAY WORK-UP.  Patient is a 50 year old female scheduled for C3-4, C5-6 ACDF with possible removal of plate at D34-534, D34-534 on 03/29/16 by Dr. Joya Salm.  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 Last BMI is listed as 49.78. which correlates with morbid obesity.  PCP is listed as Charlott Holler, NP. Cardiologist is Dr. Virl Axe, last visit 03/14/16 with Baldwin [place], PA-C. She reported getting SOB easier with exertion, but also felt to be a poor historian when it came to comparing from new/different to baseline. She denied chest pain. (She had a previous negative stress test ~ 2011 for atypical chest pain reportedly due to sliding hiatal hernia.) Pulmonary exam was "completely normal and no findings to suggest fluid OL." Echo was ordered as well as BMET and Magnesium. She was continued on b-blocker for prolonged QT. Orthostatic dizziness was "unchanged now for years." Three month follow-up planned. BMET and Mg were WNL. Echo is not scheduled until 04/07/16. On 03/25/16, patient tole her PAT phone RN, that she "is no longer having sob because she has gotten a new CPAP and it's doing much better for her."   Meds include albuterol, aspirin 81 mg, Symbicort, Lexapro, Nexium, ferrous sulfate, Lasix, Levsin SL, labetalol, Relafen, fish oil, KCl, Aldactone, Maxide.  03/14/16 EKG: SR with short PR, low voltage QRS, non-specific T wave abnormality, prolonged QT.  05/2012 Event monitor: SR, no arrhythmia, isolated PVCs. Symptoms of dizziness were associated with SR.   01/19/09 Nuclear stress test: Normal stress nuclear  study, RV appeared dilated with significant dyspnea, EF 60%.  She reported a remote history of cardiac cath (no report seen in Epic).  12/17/15 CT chest/abdomen: IMPRESSION: 1. Negative CT the chest. 2. Negative CT the abdomen. No explanation for the patient's left upper quadrant pain is seen  Anesthesiologist Dr. Marcie Bal reviewed recent cardiology note and pending echo. Patient had normal pulmonary exam and no signs of volume overload at recent visit. She says breathing is now better since getting a new CPAP. She is a same day work-up, so she will be further evaluated by her anesthesiologist on the day of surgery, but if SOB/DOE remains improved and no acute/worrisome cardiopulmonary findings then it is anticipated that she can proceed as planned.  George Hugh [facility] Short Stay Center/Anesthesiology Phone [phone] 03/28/2016 11:39 AM

## 2016-03-29 ENCOUNTER — Inpatient Hospital Stay (HOSPITAL_COMMUNITY): Payer: Medicare Other | Admitting: Vascular Surgery

## 2016-03-29 ENCOUNTER — Inpatient Hospital Stay (HOSPITAL_COMMUNITY)
Admission: RE | Admit: 2016-03-29 | Discharge: 2016-03-31 | DRG: 472 | Disposition: A | Discharge status: Home / Self Care | Payer: Medicare Other | Source: Ambulatory Visit | Attending: Neurosurgery | Admitting: Neurosurgery

## 2016-03-29 ENCOUNTER — Encounter (HOSPITAL_COMMUNITY): Payer: Self-pay | Admitting: *Deleted

## 2016-03-29 ENCOUNTER — Encounter (HOSPITAL_COMMUNITY)
Admission: RE | Disposition: A | Discharge status: Home / Self Care | Payer: Self-pay | Source: Ambulatory Visit | Attending: Neurosurgery

## 2016-03-29 ENCOUNTER — Inpatient Hospital Stay (HOSPITAL_COMMUNITY): Payer: Medicare Other

## 2016-03-29 DIAGNOSIS — Z87891 Personal history of nicotine dependence: Secondary | ICD-10-CM

## 2016-03-29 DIAGNOSIS — M5412 Radiculopathy, cervical region: Secondary | ICD-10-CM | POA: Diagnosis not present

## 2016-03-29 DIAGNOSIS — M4722 Other spondylosis with radiculopathy, cervical region: Secondary | ICD-10-CM | POA: Diagnosis not present

## 2016-03-29 DIAGNOSIS — Z6841 Body Mass Index (BMI) 40.0 and over, adult: Secondary | ICD-10-CM

## 2016-03-29 DIAGNOSIS — M5011 Cervical disc disorder with radiculopathy,  high cervical region: Secondary | ICD-10-CM | POA: Diagnosis not present

## 2016-03-29 DIAGNOSIS — Z419 Encounter for procedure for purposes other than remedying health state, unspecified: Secondary | ICD-10-CM

## 2016-03-29 DIAGNOSIS — I10 Essential (primary) hypertension: Secondary | ICD-10-CM | POA: Diagnosis not present

## 2016-03-29 DIAGNOSIS — F419 Anxiety disorder, unspecified: Secondary | ICD-10-CM | POA: Diagnosis present

## 2016-03-29 DIAGNOSIS — I4581 Long QT syndrome: Secondary | ICD-10-CM | POA: Diagnosis present

## 2016-03-29 DIAGNOSIS — G4733 Obstructive sleep apnea (adult) (pediatric): Secondary | ICD-10-CM | POA: Diagnosis not present

## 2016-03-29 DIAGNOSIS — I739 Peripheral vascular disease, unspecified: Secondary | ICD-10-CM | POA: Diagnosis present

## 2016-03-29 DIAGNOSIS — Z981 Arthrodesis status: Secondary | ICD-10-CM

## 2016-03-29 DIAGNOSIS — M542 Cervicalgia: Secondary | ICD-10-CM | POA: Diagnosis not present

## 2016-03-29 DIAGNOSIS — F329 Major depressive disorder, single episode, unspecified: Secondary | ICD-10-CM | POA: Diagnosis present

## 2016-03-29 HISTORY — PX: ANTERIOR CERVICAL DECOMP/DISCECTOMY FUSION: SHX1161

## 2016-03-29 LAB — CBC
HCT: 33.8 % — ABNORMAL LOW (ref 36.0–46.0)
Hemoglobin: 11 g/dL — ABNORMAL LOW (ref 12.0–15.0)
MCH: 28.5 pg (ref 26.0–34.0)
MCHC: 32.5 g/dL (ref 30.0–36.0)
MCV: 87.6 fL (ref 78.0–100.0)
PLATELETS: 214 10*3/uL (ref 150–400)
RBC: 3.86 MIL/uL — AB (ref 3.87–5.11)
RDW: 13.9 % (ref 11.5–15.5)
WBC: 6 10*3/uL (ref 4.0–10.5)

## 2016-03-29 LAB — TYPE AND SCREEN
ABO/RH(D): A POS
ANTIBODY SCREEN: NEGATIVE

## 2016-03-29 LAB — BASIC METABOLIC PANEL
Anion gap: 8 (ref 5–15)
BUN: 12 mg/dL (ref 6–20)
CALCIUM: 9.1 mg/dL (ref 8.9–10.3)
CHLORIDE: 105 mmol/L (ref 101–111)
CO2: 25 mmol/L (ref 22–32)
CREATININE: 0.94 mg/dL (ref 0.44–1.00)
GFR calc non Af Amer: >60 mL/min (ref >60)
Glucose, Bld: 99 mg/dL (ref 65–99)
Potassium: 3.3 mmol/L — ABNORMAL LOW (ref 3.5–5.1)
SODIUM: 138 mmol/L (ref 135–145)

## 2016-03-29 LAB — SURGICAL PCR SCREEN
MRSA, PCR: NEGATIVE
STAPHYLOCOCCUS AUREUS: NEGATIVE

## 2016-03-29 SURGERY — ANTERIOR CERVICAL DECOMPRESSION/DISCECTOMY FUSION 2 LEVEL/HARDWARE REMOVAL
Anesthesia: General | Site: Spine Cervical

## 2016-03-29 MED ORDER — ZOLPIDEM TARTRATE 5 MG PO TABS
5.0000 mg | ORAL_TABLET | Freq: Every evening | ORAL | Status: DC | PRN
  Administered 2016-03-29 – 2016-03-30 (×2): 5 mg via ORAL
  Filled 2016-03-29 (×2): qty 1

## 2016-03-29 MED ORDER — ROCURONIUM BROMIDE 100 MG/10ML IV SOLN
INTRAVENOUS | Status: DC | PRN
  Administered 2016-03-29: 30 mg via INTRAVENOUS
  Administered 2016-03-29: 50 mg via INTRAVENOUS
  Administered 2016-03-29: 20 mg via INTRAVENOUS

## 2016-03-29 MED ORDER — SENNA 8.6 MG PO TABS
1.0000 | ORAL_TABLET | Freq: Two times a day (BID) | ORAL | Status: DC
  Administered 2016-03-29 – 2016-03-30 (×3): 8.6 mg via ORAL
  Filled 2016-03-29 (×3): qty 1

## 2016-03-29 MED ORDER — THROMBIN 5000 UNITS EX SOLR
CUTANEOUS | Status: AC
  Filled 2016-03-29: qty 5000

## 2016-03-29 MED ORDER — MENTHOL 3 MG MT LOZG
1.0000 | LOZENGE | OROMUCOSAL | Status: DC | PRN
  Filled 2016-03-29: qty 9

## 2016-03-29 MED ORDER — CHLORHEXIDINE GLUCONATE CLOTH 2 % EX PADS: 6.0000 | MEDICATED_PAD | Freq: Once | CUTANEOUS | Status: DC

## 2016-03-29 MED ORDER — OXYCODONE-ACETAMINOPHEN 5-325 MG PO TABS
1.0000 | ORAL_TABLET | ORAL | Status: DC | PRN
  Administered 2016-03-29 – 2016-03-31 (×8): 2 via ORAL
  Filled 2016-03-29 (×8): qty 2

## 2016-03-29 MED ORDER — HYDROMORPHONE HCL 2 MG/ML IJ SOLN
INTRAMUSCULAR | Status: AC
  Filled 2016-03-29: qty 1

## 2016-03-29 MED ORDER — ONDANSETRON HCL 4 MG/2ML IJ SOLN: 4.0000 mg | INTRAMUSCULAR | Status: DC | PRN

## 2016-03-29 MED ORDER — FENTANYL CITRATE (PF) 100 MCG/2ML IJ SOLN
INTRAMUSCULAR | Status: AC
  Filled 2016-03-29: qty 4

## 2016-03-29 MED ORDER — MOMETASONE FURO-FORMOTEROL FUM 200-5 MCG/ACT IN AERO
2.0000 | INHALATION_SPRAY | Freq: Two times a day (BID) | RESPIRATORY_TRACT | Status: DC
  Administered 2016-03-29 – 2016-03-31 (×3): 2 via RESPIRATORY_TRACT
  Filled 2016-03-29: qty 8.8

## 2016-03-29 MED ORDER — PANTOPRAZOLE SODIUM 40 MG PO TBEC
40.0000 mg | DELAYED_RELEASE_TABLET | Freq: Every day | ORAL | Status: DC
  Administered 2016-03-29 – 2016-03-30 (×2): 40 mg via ORAL
  Filled 2016-03-29 (×2): qty 1

## 2016-03-29 MED ORDER — DEXAMETHASONE SODIUM PHOSPHATE 4 MG/ML IJ SOLN: 4.0000 mg | Freq: Four times a day (QID) | INTRAMUSCULAR | Status: DC

## 2016-03-29 MED ORDER — NEOSTIGMINE METHYLSULFATE 10 MG/10ML IV SOLN
INTRAVENOUS | Status: DC | PRN
  Administered 2016-03-29: 4 mg via INTRAVENOUS

## 2016-03-29 MED ORDER — MIDAZOLAM HCL 5 MG/5ML IJ SOLN
INTRAMUSCULAR | Status: DC | PRN
  Administered 2016-03-29: 2 mg via INTRAVENOUS

## 2016-03-29 MED ORDER — CYCLOBENZAPRINE HCL 10 MG PO TABS
10.0000 mg | ORAL_TABLET | Freq: Three times a day (TID) | ORAL | Status: DC | PRN
  Administered 2016-03-29 – 2016-03-30 (×3): 10 mg via ORAL
  Filled 2016-03-29 (×2): qty 1

## 2016-03-29 MED ORDER — SODIUM CHLORIDE 0.9% FLUSH
3.0000 mL | Freq: Two times a day (BID) | INTRAVENOUS | Status: DC
  Administered 2016-03-29 (×2): 3 mL via INTRAVENOUS

## 2016-03-29 MED ORDER — FUROSEMIDE 20 MG PO TABS
20.0000 mg | ORAL_TABLET | Freq: Every day | ORAL | Status: DC
  Administered 2016-03-29 – 2016-03-30 (×2): 20 mg via ORAL
  Filled 2016-03-29 (×2): qty 1

## 2016-03-29 MED ORDER — PHENYLEPHRINE HCL 10 MG/ML IJ SOLN
INTRAVENOUS | Status: DC | PRN
  Administered 2016-03-29: 15 ug/min via INTRAVENOUS

## 2016-03-29 MED ORDER — ESCITALOPRAM OXALATE 20 MG PO TABS
20.0000 mg | ORAL_TABLET | Freq: Every day | ORAL | Status: DC
  Administered 2016-03-30: 20 mg via ORAL
  Filled 2016-03-29 (×2): qty 1

## 2016-03-29 MED ORDER — 0.9 % SODIUM CHLORIDE (POUR BTL) OPTIME
TOPICAL | Status: DC | PRN
  Administered 2016-03-29: 1000 mL

## 2016-03-29 MED ORDER — ROCURONIUM BROMIDE 10 MG/ML (PF) SYRINGE
PREFILLED_SYRINGE | INTRAVENOUS | Status: AC
  Filled 2016-03-29: qty 10

## 2016-03-29 MED ORDER — SPIRONOLACTONE 25 MG PO TABS
25.0000 mg | ORAL_TABLET | Freq: Every day | ORAL | Status: DC
  Administered 2016-03-29 – 2016-03-30 (×2): 25 mg via ORAL
  Filled 2016-03-29 (×3): qty 1

## 2016-03-29 MED ORDER — GLYCOPYRROLATE 0.2 MG/ML IJ SOLN
INTRAMUSCULAR | Status: DC | PRN
  Administered 2016-03-29: 0.6 mg via INTRAVENOUS

## 2016-03-29 MED ORDER — POTASSIUM CHLORIDE CRYS ER 20 MEQ PO TBCR
20.0000 meq | EXTENDED_RELEASE_TABLET | Freq: Every day | ORAL | Status: DC
  Administered 2016-03-29 – 2016-03-30 (×2): 20 meq via ORAL
  Filled 2016-03-29 (×2): qty 1

## 2016-03-29 MED ORDER — THROMBIN 5000 UNITS EX SOLR
OROMUCOSAL | Status: DC | PRN
  Administered 2016-03-29 (×2): via TOPICAL

## 2016-03-29 MED ORDER — DEXAMETHASONE 4 MG PO TABS
4.0000 mg | ORAL_TABLET | Freq: Four times a day (QID) | ORAL | Status: DC
  Administered 2016-03-29 – 2016-03-31 (×8): 4 mg via ORAL
  Filled 2016-03-29 (×8): qty 1

## 2016-03-29 MED ORDER — CYCLOBENZAPRINE HCL 10 MG PO TABS
ORAL_TABLET | ORAL | Status: AC
  Filled 2016-03-29: qty 1

## 2016-03-29 MED ORDER — SUCCINYLCHOLINE CHLORIDE 200 MG/10ML IV SOSY
PREFILLED_SYRINGE | INTRAVENOUS | Status: AC
  Filled 2016-03-29: qty 10

## 2016-03-29 MED ORDER — MORPHINE SULFATE (PF) 4 MG/ML IV SOLN: 1.0000 mg | INTRAVENOUS | Status: DC | PRN

## 2016-03-29 MED ORDER — MUPIROCIN 2 % EX OINT
1.0000 "application " | TOPICAL_OINTMENT | Freq: Once | CUTANEOUS | Status: AC
  Administered 2016-03-29: 1 via TOPICAL
  Filled 2016-03-29: qty 22

## 2016-03-29 MED ORDER — ACETAMINOPHEN 325 MG PO TABS: 650.0000 mg | ORAL_TABLET | ORAL | Status: DC | PRN

## 2016-03-29 MED ORDER — PROMETHAZINE HCL 25 MG/ML IJ SOLN: 6.2500 mg | INTRAMUSCULAR | Status: DC | PRN

## 2016-03-29 MED ORDER — FENTANYL CITRATE (PF) 100 MCG/2ML IJ SOLN
INTRAMUSCULAR | Status: DC | PRN
  Administered 2016-03-29: 100 ug via INTRAVENOUS
  Administered 2016-03-29 (×2): 50 ug via INTRAVENOUS

## 2016-03-29 MED ORDER — DEXAMETHASONE SODIUM PHOSPHATE 10 MG/ML IJ SOLN
INTRAMUSCULAR | Status: AC
  Filled 2016-03-29: qty 1

## 2016-03-29 MED ORDER — LABETALOL HCL 200 MG PO TABS
200.0000 mg | ORAL_TABLET | Freq: Two times a day (BID) | ORAL | Status: DC
  Administered 2016-03-29 – 2016-03-30 (×3): 200 mg via ORAL
  Filled 2016-03-29 (×4): qty 1

## 2016-03-29 MED ORDER — MIDAZOLAM HCL 2 MG/2ML IJ SOLN
INTRAMUSCULAR | Status: AC
  Filled 2016-03-29: qty 2

## 2016-03-29 MED ORDER — HYDROMORPHONE HCL 1 MG/ML IJ SOLN
0.2500 mg | INTRAMUSCULAR | Status: DC | PRN
  Administered 2016-03-29: 0.5 mg via INTRAVENOUS

## 2016-03-29 MED ORDER — ALBUTEROL SULFATE HFA 108 (90 BASE) MCG/ACT IN AERS
1.0000 | INHALATION_SPRAY | RESPIRATORY_TRACT | Status: DC | PRN
Start: 2016-03-29 — End: 2016-03-29

## 2016-03-29 MED ORDER — THROMBIN 20000 UNITS EX SOLR
CUTANEOUS | Status: AC
  Filled 2016-03-29: qty 20000

## 2016-03-29 MED ORDER — THROMBIN 20000 UNITS EX SOLR
CUTANEOUS | Status: DC | PRN
  Administered 2016-03-29: 09:00:00 via TOPICAL

## 2016-03-29 MED ORDER — PROPOFOL 10 MG/ML IV BOLUS
INTRAVENOUS | Status: DC | PRN
  Administered 2016-03-29: 160 mg via INTRAVENOUS

## 2016-03-29 MED ORDER — DEXAMETHASONE SODIUM PHOSPHATE 10 MG/ML IJ SOLN
INTRAMUSCULAR | Status: DC | PRN
  Administered 2016-03-29: 10 mg via INTRAVENOUS

## 2016-03-29 MED ORDER — CEFAZOLIN SODIUM-DEXTROSE 2-4 GM/100ML-% IV SOLN
2.0000 g | Freq: Three times a day (TID) | INTRAVENOUS | Status: AC
  Administered 2016-03-29 (×2): 2 g via INTRAVENOUS
  Filled 2016-03-29 (×2): qty 100

## 2016-03-29 MED ORDER — PHENOL 1.4 % MT LIQD
1.0000 | OROMUCOSAL | Status: DC | PRN
Start: 2016-03-29 — End: 2016-03-31
  Filled 2016-03-29: qty 177

## 2016-03-29 MED ORDER — OXYCODONE HCL 5 MG/5ML PO SOLN: 5.0000 mg | Freq: Once | ORAL | Status: AC | PRN

## 2016-03-29 MED ORDER — LIDOCAINE HCL (CARDIAC) 20 MG/ML IV SOLN
INTRAVENOUS | Status: DC | PRN
  Administered 2016-03-29: 60 mg via INTRAVENOUS
  Administered 2016-03-29: 50 mg via INTRATRACHEAL

## 2016-03-29 MED ORDER — ONDANSETRON HCL 4 MG/2ML IJ SOLN
INTRAMUSCULAR | Status: AC
  Filled 2016-03-29: qty 2

## 2016-03-29 MED ORDER — OXYCODONE HCL 5 MG PO TABS
ORAL_TABLET | ORAL | Status: AC
  Filled 2016-03-29: qty 1

## 2016-03-29 MED ORDER — SODIUM CHLORIDE 0.9 % IV SOLN: INTRAVENOUS | Status: DC

## 2016-03-29 MED ORDER — OXYCODONE HCL 5 MG PO TABS
5.0000 mg | ORAL_TABLET | Freq: Once | ORAL | Status: AC | PRN
  Administered 2016-03-29: 5 mg via ORAL

## 2016-03-29 MED ORDER — ALUM & MAG HYDROXIDE-SIMETH 200-200-20 MG/5ML PO SUSP: 30.0000 mL | Freq: Four times a day (QID) | ORAL | Status: DC | PRN

## 2016-03-29 MED ORDER — TRIAMTERENE-HCTZ 75-50 MG PO TABS
1.0000 | ORAL_TABLET | Freq: Every day | ORAL | Status: DC
  Administered 2016-03-30: 1 via ORAL
  Filled 2016-03-29 (×2): qty 1

## 2016-03-29 MED ORDER — LACTATED RINGERS IV SOLN
INTRAVENOUS | Status: DC | PRN
  Administered 2016-03-29 (×2): via INTRAVENOUS

## 2016-03-29 MED ORDER — LIDOCAINE 2% (20 MG/ML) 5 ML SYRINGE
INTRAMUSCULAR | Status: AC
  Filled 2016-03-29: qty 5

## 2016-03-29 MED ORDER — ONDANSETRON HCL 4 MG/2ML IJ SOLN
INTRAMUSCULAR | Status: DC | PRN
  Administered 2016-03-29: 4 mg via INTRAVENOUS

## 2016-03-29 MED ORDER — ACETAMINOPHEN 650 MG RE SUPP: 650.0000 mg | RECTAL | Status: DC | PRN

## 2016-03-29 MED ORDER — SODIUM CHLORIDE 0.9% FLUSH: 3.0000 mL | INTRAVENOUS | Status: DC | PRN

## 2016-03-29 SURGICAL SUPPLY — 57 items
BENZOIN TINCTURE PRP APPL 2/3 (GAUZE/BANDAGES/DRESSINGS) ×2 IMPLANT
BLADE ULTRA TIP 2M (BLADE) ×2 IMPLANT
BUR MATCHSTICK NEURO 3.0 LAGG (BURR) ×2 IMPLANT
CANISTER SUCT 3000ML PPV (MISCELLANEOUS) ×2 IMPLANT
CARTRIDGE OIL MAESTRO DRILL (MISCELLANEOUS) ×1 IMPLANT
COVER MAYO STAND STRL (DRAPES) ×2 IMPLANT
DIFFUSER DRILL AIR PNEUMATIC (MISCELLANEOUS) ×2 IMPLANT
DRAIN HEMOVAC 1/8 X 5 (WOUND CARE) ×2 IMPLANT
DRAPE C-ARM 42X72 X-RAY (DRAPES) ×4 IMPLANT
DRAPE HALF SHEET 40X57 (DRAPES) ×2 IMPLANT
DRAPE LAPAROTOMY 100X72 PEDS (DRAPES) ×2 IMPLANT
DRAPE MICROSCOPE LEICA (MISCELLANEOUS) ×2 IMPLANT
DRAPE POUCH INSTRU U-SHP 10X18 (DRAPES) ×2 IMPLANT
DRSG OPSITE POSTOP 4X6 (GAUZE/BANDAGES/DRESSINGS) ×2 IMPLANT
DURAPREP 6ML APPLICATOR 50/CS (WOUND CARE) ×2 IMPLANT
ELECT REM PT RETURN 9FT ADLT (ELECTROSURGICAL) ×2
ELECTRODE REM PT RTRN 9FT ADLT (ELECTROSURGICAL) ×1 IMPLANT
EVACUATOR SILICONE 100CC (DRAIN) ×2 IMPLANT
FORCEPS BIPOLAR SPETZLER 8 1.0 (NEUROSURGERY SUPPLIES) ×2 IMPLANT
GAUZE SPONGE 4X4 12PLY STRL (GAUZE/BANDAGES/DRESSINGS) ×2 IMPLANT
GAUZE SPONGE 4X4 16PLY XRAY LF (GAUZE/BANDAGES/DRESSINGS) ×2 IMPLANT
GLOVE BIO SURGEON STRL SZ 6.5 (GLOVE) ×2 IMPLANT
GLOVE BIOGEL M 8.0 STRL (GLOVE) ×2 IMPLANT
GLOVE BIOGEL PI IND STRL 7.0 (GLOVE) ×2 IMPLANT
GLOVE BIOGEL PI IND STRL 7.5 (GLOVE) ×2 IMPLANT
GLOVE BIOGEL PI INDICATOR 7.0 (GLOVE) ×2
GLOVE BIOGEL PI INDICATOR 7.5 (GLOVE) ×2
GLOVE ECLIPSE 9.0 STRL (GLOVE) ×2 IMPLANT
GLOVE SS N UNI LF 6.5 STRL (GLOVE) ×4 IMPLANT
GLOVE SURG SS PI 6.5 STRL IVOR (GLOVE) ×4 IMPLANT
GOWN STRL REUS W/ TWL LRG LVL3 (GOWN DISPOSABLE) ×1 IMPLANT
GOWN STRL REUS W/ TWL XL LVL3 (GOWN DISPOSABLE) ×2 IMPLANT
GOWN STRL REUS W/TWL 2XL LVL3 (GOWN DISPOSABLE) IMPLANT
GOWN STRL REUS W/TWL LRG LVL3 (GOWN DISPOSABLE) ×1
GOWN STRL REUS W/TWL XL LVL3 (GOWN DISPOSABLE) ×2
HALTER HD/CHIN CERV TRACTION D (MISCELLANEOUS) ×2 IMPLANT
HEMOSTAT POWDER KIT SURGIFOAM (HEMOSTASIS) ×4 IMPLANT
KIT BASIN OR (CUSTOM PROCEDURE TRAY) ×2 IMPLANT
KIT ROOM TURNOVER OR (KITS) ×2 IMPLANT
NEEDLE SPNL 22GX3.5 QUINCKE BK (NEEDLE) ×2 IMPLANT
NS IRRIG 1000ML POUR BTL (IV SOLUTION) ×2 IMPLANT
OIL CARTRIDGE MAESTRO DRILL (MISCELLANEOUS) ×2
PACK LAMINECTOMY NEURO (CUSTOM PROCEDURE TRAY) ×2 IMPLANT
PATTIES SURGICAL .5 X1 (DISPOSABLE) ×2 IMPLANT
PLATE ANT CERV TI VIP 19MM (Plate) ×2 IMPLANT
PLATE ANT CERV TI VIP 21MM (Plate) ×2 IMPLANT
RUBBERBAND STERILE (MISCELLANEOUS) ×4 IMPLANT
SCREW SELF TAP ANG VIP 5.1X12 (Screw) ×4 IMPLANT
SCREW SELF TAP VARIABLE 4.6X12 (Screw) ×4 IMPLANT
SPACER CERVICAL FRGE 12X14X6-7 (Spacer) ×4 IMPLANT
SPONGE INTESTINAL PEANUT (DISPOSABLE) ×2 IMPLANT
SPONGE SURGIFOAM ABS GEL 100 (HEMOSTASIS) ×2 IMPLANT
STRIP CLOSURE SKIN 1/2X4 (GAUZE/BANDAGES/DRESSINGS) ×2 IMPLANT
SUT VIC AB 3-0 SH 8-18 (SUTURE) ×6 IMPLANT
TOWEL OR 17X24 6PK STRL BLUE (TOWEL DISPOSABLE) ×2 IMPLANT
TOWEL OR 17X26 10 PK STRL BLUE (TOWEL DISPOSABLE) ×2 IMPLANT
WATER STERILE IRR 1000ML POUR (IV SOLUTION) ×2 IMPLANT

## 2016-03-29 NOTE — Anesthesia Procedure Notes (Signed)
Procedure Name: Intubation Date/Time: 03/29/2016 8:56 AM Performed by: Clearnce Sorrel Pre-anesthesia Checklist: Patient identified, Emergency Drugs available, Suction available, Patient being monitored and Timeout performed Patient Re-evaluated:Patient Re-evaluated prior to inductionOxygen Delivery Method: Circle system utilized Preoxygenation: Pre-oxygenation with 100% oxygen Intubation Type: IV induction Ventilation: Oral airway inserted - appropriate to patient size Laryngoscope Size: Glidescope (limited neck ROM) Grade View: Grade I Tube type: Oral Tube size: 7.5 mm Number of attempts: 1 Airway Equipment and Method: Stylet and LTA kit utilized Placement Confirmation: ETT inserted through vocal cords under direct vision,  positive ETCO2,  CO2 detector and breath sounds checked- equal and bilateral Secured at: 23 cm Tube secured with: Tape Dental Injury: Teeth and Oropharynx as per pre-operative assessment

## 2016-03-29 NOTE — Evaluation (Signed)
Physical Therapy Evaluation Patient Details Name: [name] MRN: [id number] DOB: 04/04/1966 Today's Date: 03/29/2016   History of Present Illness  Patient is a 50 yo female s/p CERVICAL THREE-FOUR, CERVICAL FOUR-FIVE ANTERIOR CERVICAL DECOMPRESSION/DISCECTOMY/FUSION WITH REMOVAL OF PLATES AT D34-534 D34-534  Clinical Impression  Patient seen for mobility assessment and education s/p spinal surgery. At this time, patient mobilizing very well. Able to walk in hall with use of cane. Patient was even 'dancing' in the halls during activity showing no visible signs of balance deficits. At this time will defer all further assessment and education to OT therapist. No further acute PT needs. Will sign off.    Follow Up Recommendations No PT follow up;Supervision/Assistance - 24 hour    Equipment Recommendations  None recommended by PT    Recommendations for Other Services       Precautions / Restrictions Precautions Precautions: Cervical Precaution Comments: reviewed handout with patient verbally Required Braces or Orthoses: Cervical Brace Cervical Brace: Soft collar Restrictions Weight Bearing Restrictions: No      Mobility  Bed Mobility Overal bed mobility: Needs Assistance Bed Mobility: Sit to Supine       Sit to supine: Supervision   General bed mobility comments: no physical assist, increased time to perform  Transfers Overall transfer level: Modified independent Equipment used: Straight cane Transfers: Sit to/from Stand Sit to Stand: Modified independent (Device/Increase time)            Ambulation/Gait Ambulation/Gait assistance: Supervision Ambulation Distance (Feet): 210 Feet Assistive device: Straight cane Gait Pattern/deviations: Step-through pattern;Decreased stride length;Wide base of support Gait velocity: decreased Gait velocity interpretation: Below normal speed for age/gender General Gait Details: steady with ambulation, patient even able to  'dance' to music while ambulating in hall  Stairs            Wheelchair Mobility    Modified Rankin (Stroke Patients Only)       Balance Overall balance assessment: Modified Independent (patient able to dance in halls with no difficulty)                                           Pertinent Vitals/Pain Pain Assessment: 0-10 Pain Score: 7  Pain Location: neck (surgical site) Pain Descriptors / Indicators: Aching;Guarding;Sore Pain Intervention(s): Monitored during session;Repositioned;Relaxation;Ice applied    Home Living Family/patient expects to be discharged to:: Private residence Living Arrangements: Children Available Help at Discharge: Family Type of Home: Apartment Home Access: Level entry     Home Layout: Two level [place]: Cane - single point (right AFO (but can walk without it))      Prior Function Level of Independence: Needs assistance   Gait / Transfers Assistance Needed: uses cane for mobility and has a foot AFO.  ADL's / Homemaking Assistance Needed: has help around the home        Hand Dominance   Dominant Hand: Right    Extremity/Trunk Assessment   Upper Extremity Assessment: Defer to OT evaluation           Lower Extremity Assessment: Overall WFL for tasks assessed      Cervical / Trunk Assessment:  (s/p cervical sx )  Communication   Communication: No difficulties  Cognition Arousal/Alertness: Awake/alert Behavior During Therapy: WFL for tasks assessed/performed Overall Cognitive Status: Within Functional Limits for tasks assessed  General Comments General comments (skin integrity, edema, etc.): educated on car transfer technique, discussed mobility expectations and precautions    Exercises     Assessment/Plan    PT Assessment Patent does not need any further PT services  PT Problem List            PT Treatment Interventions      PT Goals (Current goals can be  found in the Care Plan section)  Acute Rehab PT Goals Patient Stated Goal: to go home PT Goal Formulation: All assessment and education complete, DC therapy (defer further needs to OT)    Frequency     Barriers to discharge        Co-evaluation               End of Session Equipment Utilized During Treatment: Cervical collar Activity Tolerance: Patient tolerated treatment well;No increased pain Patient left: in bed;with call bell/phone within reach;with family/visitor present Nurse Communication: Mobility status    Functional Assessment Tool Used: clinical judgement Functional Limitation: Mobility: Walking and moving around Mobility: Walking and Moving Around Current Status [id number]): At least 1 percent but less than 20 percent impaired, limited or restricted Mobility: Walking and Moving Around Goal Status [phone]): At least 1 percent but less than 20 percent impaired, limited or restricted Mobility: Walking and Moving Around Discharge Status [phone]): At least 1 percent but less than 20 percent impaired, limited or restricted    Time: [id number] PT Time Calculation (min) (ACUTE ONLY): 24 min   Charges:   PT Evaluation $PT Eval Moderate Complexity: 1 Procedure     PT G Codes:   PT G-Codes **NOT FOR INPATIENT CLASS** Functional Assessment Tool Used: clinical judgement Functional Limitation: Mobility: Walking and moving around Mobility: Walking and Moving Around Current Status [id number]): At least 1 percent but less than 20 percent impaired, limited or restricted Mobility: Walking and Moving Around Goal Status [phone]): At least 1 percent but less than 20 percent impaired, limited or restricted Mobility: Walking and Moving Around Discharge Status [phone]): At least 1 percent but less than 20 percent impaired, limited or restricted    Duncan Dull 03/29/2016, 5:24 PM Alben Deeds, [place] DPT  [phone]

## 2016-03-29 NOTE — Anesthesia Preprocedure Evaluation (Signed)
Anesthesia Evaluation  Patient identified by MRN, date of birth, ID band Patient awake    Reviewed: Allergy & Precautions, H&P , NPO status , Patient's Chart, lab work & pertinent test results  Airway Mallampati: II   Neck ROM: full    Dental   Pulmonary sleep apnea , former smoker,    breath sounds clear to auscultation       Cardiovascular hypertension, + Peripheral Vascular Disease   Rhythm:regular Rate:Normal     Neuro/Psych PSYCHIATRIC DISORDERS Anxiety Depression    GI/Hepatic GERD  ,  Endo/Other  Morbid obesity  Renal/GU      Musculoskeletal  (+) Arthritis ,   Abdominal   Peds  Hematology   Anesthesia Other Findings   Reproductive/Obstetrics                             Anesthesia Physical Anesthesia Plan  ASA: II  Anesthesia Plan: General   Post-op Pain Management:    Induction: Intravenous  Airway Management Planned: Oral ETT  Additional Equipment:   Intra-op Plan:   Post-operative Plan: Extubation in OR  Informed Consent: I have reviewed the patients History and Physical, chart, labs and discussed the procedure including the risks, benefits and alternatives for the proposed anesthesia with the patient or authorized representative who has indicated his/her understanding and acceptance.     Plan Discussed with: CRNA, Anesthesiologist and Surgeon  Anesthesia Plan Comments:         Anesthesia Quick Evaluation

## 2016-03-29 NOTE — Progress Notes (Signed)
Orthopedic Tech Progress Note Patient Details:  [name] 1965-08-02 [id number]  Ortho Devices Type of Ortho Device: Soft collar Ortho Device/Splint Interventions: Application   [place] Pink 03/29/2016, 12:35 PM

## 2016-03-29 NOTE — Anesthesia Postprocedure Evaluation (Signed)
Anesthesia Post Note  Patient: [name]  Procedure(s) Performed: Procedure(s) (LRB): CERVICAL THREE-FOUR, CERVICAL FOUR-FIVE ANTERIOR CERVICAL DECOMPRESSION/DISCECTOMY/FUSION WITH REMOVAL OF PLATES AT D34-534 D34-534 (N/A)  Patient location during evaluation: PACU Anesthesia Type: General Level of consciousness: awake and alert and patient cooperative Pain management: pain level controlled Vital Signs Assessment: post-procedure vital signs reviewed and stable Respiratory status: spontaneous breathing and respiratory function stable Cardiovascular status: stable Anesthetic complications: no    Last Vitals:  Vitals:   03/29/16 1236 03/29/16 1256  BP:    Pulse: (!) 58 (!) 55  Resp: 20 16  Temp:      Last Pain:  Vitals:   03/29/16 1256  TempSrc:   PainSc: 10-Worst pain ever                 Jamyla Ard S

## 2016-03-29 NOTE — Progress Notes (Signed)
RT came to give MDI. Patient is on her home CPAP. No O2 bleed in needed. Patient tolerating well.

## 2016-03-29 NOTE — Progress Notes (Signed)
Patient ID: [name], female   DOB: November 03, 1965, 50 y.o.   MRN: [id number] Voice normal. Drain working well. No weakness

## 2016-03-29 NOTE — Transfer of Care (Signed)
Immediate Anesthesia Transfer of Care Note  Patient: [name]  Procedure(s) Performed: Procedure(s) with comments: CERVICAL THREE-FOUR, CERVICAL FOUR-FIVE ANTERIOR CERVICAL DECOMPRESSION/DISCECTOMY/FUSION WITH REMOVAL OF PLATES AT D34-534 D34-534 (N/A) - C3-4 C5-6 ANTERIOR CERVICAL DECOMPRESSION/DISCECTOMY/FUSION WITH POSSIBLE REMOVAL OF PLATE AT D34-534 D34-534  Patient Location: PACU  Anesthesia Type:General  Level of Consciousness: awake, alert  and oriented  Airway & Oxygen Therapy: Patient Spontanous Breathing and Patient connected to face mask oxygen  Post-op Assessment: Report given to RN and Post -op Vital signs reviewed and stable  Post vital signs: Reviewed and stable  Last Vitals:  Vitals:   03/29/16 0753  BP: 125/85  Pulse: (!) 57  Resp: 20  Temp: 36.7 C    Last Pain:  Vitals:   03/29/16 0753  TempSrc: Oral      Patients Stated Pain Goal: 3 ([id number])  Complications: No apparent anesthesia complications

## 2016-03-30 MED FILL — Thrombin For Soln 5000 Unit: CUTANEOUS | Qty: 5000 | Status: AC

## 2016-03-30 NOTE — Evaluation (Signed)
Occupational Therapy Evaluation Patient Details Name: [name] MRN: [id number] DOB: 07-06-1965 Today's Date: 03/30/2016    History of Present Illness Patient is a 50 yo female s/p CERVICAL THREE-FOUR, CERVICAL FOUR-FIVE ANTERIOR CERVICAL DECOMPRESSION/DISCECTOMY/FUSION WITH REMOVAL OF PLATES AT D34-534 D34-534   Clinical Impression   Patient evaluated by Occupational Therapy with no further acute OT needs identified. All education has been completed and the patient has no further questions. See below for any follow-up Occupational Therapy or equipment needs. OT to sign off. Thank you for referral.      Follow Up Recommendations  No OT follow up    Equipment Recommendations  None recommended by OT    Recommendations for Other Services       Precautions / Restrictions Precautions Precautions: Cervical Precaution Comments: reviewed handout for ADLS Required Braces or Orthoses: Cervical Brace Cervical Brace: Soft collar      Mobility Bed Mobility Overal bed mobility: Modified Independent                Transfers Overall transfer level: Modified independent                    Balance                                            ADL Overall ADL's : Modified independent     Cervical precautions: Educated patient on don doff brace with return demonstration, educated on oral care using cups, washing face with cloth, never to wash directly on incision site, avoid neck rotation flexion and extension, positioning with pillows in chair for bil UE, sleeping positioning, avoiding pushing / pulling with bil UE, and fine motor exercises ( handout provided). Pt educated on need to notify doctor / RN of swallowing changes or choking. Educated on don doff brace with return demonstrate.                                     General ADL Comments: able to don doff brace using mirror        Vision Vision Assessment?: No apparent  visual deficits   Perception     Praxis      Pertinent Vitals/Pain Pain Assessment: Faces Faces Pain Scale: Hurts a little bit Pain Location: neck Pain Descriptors / Indicators: Operative site guarding Pain Intervention(s): Monitored during session;Premedicated before session     Hand Dominance Right   Extremity/Trunk Assessment Upper Extremity Assessment Upper Extremity Assessment: Overall WFL for tasks assessed   Lower Extremity Assessment Lower Extremity Assessment: Defer to PT evaluation   Cervical / Trunk Assessment Cervical / Trunk Assessment: Other exceptions Cervical / Trunk Exceptions: s/p surg   Communication Communication Communication: No difficulties   Cognition Arousal/Alertness: Awake/alert Behavior During Therapy: WFL for tasks assessed/performed Overall Cognitive Status: Within Functional Limits for tasks assessed                     General Comments       Exercises       Shoulder Instructions      Home Living Family/patient expects to be discharged to:: Private residence Living Arrangements: Children Available Help at Discharge: Family Type of Home: Apartment Home Access: Level entry     Home Layout: Two level Alternate Level Stairs-Number  of Steps: flight   Bathroom Shower/Tub: Teacher, early years/pre: Standard     Home Equipment: Kasandra Knudsen - single point          Prior Functioning/Environment Level of Independence: Needs assistance  Gait / Transfers Assistance Needed: uses cane              OT Problem List:     OT Treatment/Interventions:      OT Goals(Current goals can be found in the care plan section) Acute Rehab OT Goals Patient Stated Goal: to go home  OT Frequency:     Barriers to D/C:            Co-evaluation              End of Session Equipment Utilized During Treatment: Cervical collar Nurse Communication: Mobility status  Activity Tolerance: Patient tolerated treatment  well Patient left: in bed;with call bell/phone within reach   Time: 0752-0805 OT Time Calculation (min): 13 min Charges:  OT General Charges $OT Visit: 1 Procedure OT Evaluation $OT Eval Moderate Complexity: 1 Procedure G-Codes:    [name] 04/05/2016, 8:51 AM   [name]   OTR/L PagerIP:[phone] Office: [phone] .

## 2016-03-30 NOTE — H&P (Signed)
NAMEHILLARI, [name]            ACCOUNT NO.:  [id number]  MEDICAL RECORD NO.:  [id number]  LOCATION:                                 FACILITY:  PHYSICIAN:  Leeroy Cha, M.D.   DATE OF BIRTH:  Sep 03, 1965  DATE OF ADMISSION:  03/29/2016 DATE OF DISCHARGE:                             HISTORY & PHYSICAL   Room number pending.  HISTORY OF PRESENT ILLNESS:  [name] is a lady, who had been seen in my office because of neck pain with radiation to the shoulder, quite miserable up to the point that she ended up being seen in the emergency room and after she had a workup, she was sent to be seen by [place].  This lady many years ago underwent decompression and fusion at the cervical 4- 5 and 6-7.  Nevertheless, over the years, she is getting worse.  She is miserable.  She has difficulty moving her neck.  She has failed with conservative treatment.  PAST SURGICAL HISTORY:  She had hip surgery, foot surgery, surgery of the lumbar spine as well as cervical fusion.  ALLERGIES:  She is not allergic to any medication.  FAMILY HISTORY:  Positive for diabetes.  SOCIAL HISTORY:  The patient does not smoke.  She drinks socially.  REVIEW OF SYSTEMS:  Positive for anxiety, depression, weakness, neck pain, some high blood pressure.  PHYSICAL EXAMINATION:  HEAD, EAR, NOSE, AND THROAT:  Normal. NECK:  She has an anterior scar.  She is able to flex, but extension Gives he pain going _________ to the shoulders. CARDIOVASCULAR:  Clear. LUNGS:  Clear. ABDOMEN:  Normal. EXTREMITIES:  Normal. NEURO:  She is oriented x3 with normal cranial nerves.  I cannot find any weakness.  Reflexes are symmetrical.  Sensation is normal.  We did an outpatient cervical myelogram, which showed that she has a solid __FUSION__at______ the cervical 4-5, 6-7.  She has a degenerative disk disease at the level of cervical 3-4 and 5-6.  CLINICAL IMPRESSION:  Degenerative disk disease, cervical 3-4, 5-6. ____S/P FUSION  C4-5, 6-7_levels_____.  RECOMMENDATIONS:  I talked to the patient at length.  She wants to proceed with surgery because she is getting worse.  The procedure would be decompression at the level of cervical 3-4 and 5-6 with or without removal of the previous plate.  She is fully aware of the risk and benefit of the surgery including infection, CSF leak, weakness, paralysis, hematoma, no improvement whatsoever, need of further surgery.    ______________________________ Leeroy Cha, M.D.   ______________________________ Leeroy Cha, M.D.    EB/MEDQ  D:  03/28/2016  T:  03/29/2016  Job:  [id number]

## 2016-03-30 NOTE — Op Note (Signed)
[name], [name]            ACCOUNT NO.:  [id number]  MEDICAL RECORD NO.:  [id number]  LOCATION:                               FACILITY:  [facility]  PHYSICIAN:  Leeroy Cha, M.D.   DATE OF BIRTH:  [birth year]  DATE OF PROCEDURE: DATE OF DISCHARGE:  03/31/2016                              OPERATIVE REPORT   PREOPERATIVE DIAGNOSES:  Cervical 3, 4, 5, 6 spondylosis, radiculopathy, status post fusion, cervical 4, 5, 6, 7.  Morbid obesity.  POSTOPERATIVE DIAGNOSES:  Cervical 3, 4, 5, 6 spondylosis, radiculopathy, status post fusion, cervical 4, 5, 6, 7.  Morbid obesity.  PROCEDURE:  Removal of the C4-5 and C6-7 plate.  Cervical 3, 4, 5, 6 diskectomy, decompression of the spinal cord, foraminotomy, interbody fusion with allograft, plate from D34-534 and C5-C6, microscope.  SURGEON:  Leeroy Cha, M.D.  ASSISTANT:  Dr. Annette Stable.  CLINICAL HISTORY:  The patient underwent surgery in the past.  She did well, but lately, she had been complaining of neck pain with radiation to the shoulder.  She has failed with conservative treatment.  X-rays show solid fusion at the level of 4-5 and 6-7 with spondylosis at the lower cervical 3-4 and 5-6.  Surgery was advised.  The patient knew the risk and benefit of surgery.  DESCRIPTION OF PROCEDURE:  The patient was taken to the OR, and after intubation, it took [place] at least 45 minutes to position her head on the OR table.  We had to use a cervical traction with 6 pounds with a pillow between the shoulder blade and taped down the shoulder.  That was the only way we were able to see the neck.  Then, the area was cleaned with DuraPrep, and drapes were applied.  A longitudinal incision resecting the previous scar was made through the skin and subcutaneous tissue. The patient had a lot of fibrosis, and it took [place] at least half an hour to be able to dissect all the way down to the cervical spine.  Then, the plates at 4-5 and 6-7 were removed with parallel  dissection at the level of C3-C4 after we took an x-ray.  During the x-ray, we were only able to see just the upper part of C4.  There was no way to see below C4 secondary to her shoulder.  With the help of the microscope, we opened the anterior ligament.  The patient had quite a bit of degenerative changes at the level of C3-C4, and we drilled all the way posteriorly. Then, using the 1 mm Kerrison punch, we opened the posterior ligament, and decompression of both C4 and nerve root was achieved as well as the spinal cord.  Then, the same procedure was done at the level of 5-6. The patient again had quite a bit of fibrosis and degenerative disk disease and decompression of the spinal cord as well as the foramen to decompress both C6 and nerve root was achieved.  The endplates at those 2 levels were drilled and 2 allografts, 6 mm height, lordotic were inserted.  At the level of C3-C4, we used a plate using single screws avoiding the previous holes.  The same procedure was done at the level  of 5-6. We repeated the x-ray, and we were able to only see C3-C4.  The area was irrigated.  Hemostasis was achieved after 20 minutes.  Nevertheless, I left a drain into the area.  Then, the area was irrigated, and the wound was closed with different layer of Vicryl and Steri-Strip.          ______________________________ Leeroy Cha, M.D.     EB/MEDQ  D:  03/29/2016  T:  03/30/2016  Job:  [id number]

## 2016-03-30 NOTE — Op Note (Deleted)
  The note originally documented on this encounter has been moved the the encounter in which it belongs.  

## 2016-03-30 NOTE — Progress Notes (Signed)
Patient ID: [name], female   DOB: 10/31/1965, 50 y.o.   MRN: [id number] Eating, no weakness. Voice normal.  Drain with clear fluid, no headache. Plan to dc drain

## 2016-03-31 ENCOUNTER — Encounter (HOSPITAL_COMMUNITY): Payer: Self-pay | Admitting: Neurosurgery

## 2016-03-31 ENCOUNTER — Ambulatory Visit: Payer: Medicare Other | Admitting: Physical Medicine & Rehabilitation

## 2016-03-31 NOTE — Discharge Summary (Signed)
Physician Discharge Summary  Patient ID: [name] MRN: [id number] DOB/AGE: December 02, 1965 50 y.o.  Admit date: 03/29/2016 Discharge date: 03/31/2016  Admission Diagnoses:cervical spondylosis ,radicculopathies 34,67  Discharge Diagnoses:  Active Problems:   Cervical spondylosis with radiculopathy   Discharged Condition: no weakness but normal postop pain  Hospital Course:surgery  Consults: none  Significant Diagnostic Studies: cervical myelogram  Treatments: acdf 34, 56. Removal of 45,67 plates  Discharge Exam:  Blood pressure 135/86, pulse 94, temperature 98.5 F (36.9 C), temperature source Oral, resp. rate 18, weight 131.5 kg (290 lb), last menstrual period 02/25/2016, SpO2 96 %. Wound dry, no weakness. eating  Disposition: home. To see me in 2 weeks      Signed: Floyce Stakes 03/31/2016, 9:43 AM

## 2016-03-31 NOTE — Progress Notes (Signed)
RT NOTE:  Pt has home CPAP machine with her. She is able to manage. RT will be contacted if patient needs assistance.

## 2016-03-31 NOTE — Discharge Instructions (Signed)
Wound Care Leave incision open to air. You may shower. Do not scrub directly on incision.  Leave steri-strips on neck.  They will fall off themselves. Do not put any creams, lotions, or ointments on incision. Activity Walk each and every day, increasing distance each day. No lifting greater than 5 lbs.  Avoid excessive neck motion. No driving for 2 weeks; may ride as a passenger locally. Wear neck brace at all times except when showering.  If provided soft collar, may wear for comfort unless otherwise instructed. Diet Resume your normal diet.  Return to Work Will be discussed at you follow up appointment. Call Your Doctor If Any of These Occur Redness, drainage, or swelling at the wound.  Temperature greater than 101 degrees. Severe pain not relieved by pain medication. Increased difficulty swallowing. Incision starts to come apart. Follow Up Appt Call today for appointment in 3-4 weeks [phone]) or for problems.  If you have any hardware placed in your spine, you will need an x-ray before your appointment.

## 2016-03-31 NOTE — Progress Notes (Signed)
Pt doing well. Pt given D/C instructions with Rx's, verbal understanding was provided. Pt's incision is clean and dry with no sign of infection. Pt's IV and drain were removed prior to D/C. Pt D/C'd home via wheelchair @ 1010 per MD order. Pt is stable @ D/C and has no other needs at this time. Holli Humbles, RN

## 2016-04-01 ENCOUNTER — Encounter: Payer: Medicare Other | Attending: Physical Medicine & Rehabilitation | Admitting: Physical Medicine & Rehabilitation

## 2016-04-04 ENCOUNTER — Encounter (HOSPITAL_COMMUNITY): Payer: Self-pay

## 2016-04-04 ENCOUNTER — Emergency Department (HOSPITAL_COMMUNITY): Payer: Medicare Other

## 2016-04-04 ENCOUNTER — Inpatient Hospital Stay (HOSPITAL_COMMUNITY)
Admission: EM | Admit: 2016-04-04 | Discharge: 2016-04-07 | DRG: 176 | Disposition: A | Discharge status: Home / Self Care | Payer: Medicare Other | Attending: Internal Medicine | Admitting: Internal Medicine

## 2016-04-04 DIAGNOSIS — Z6841 Body Mass Index (BMI) 40.0 and over, adult: Secondary | ICD-10-CM | POA: Diagnosis not present

## 2016-04-04 DIAGNOSIS — Z981 Arthrodesis status: Secondary | ICD-10-CM

## 2016-04-04 DIAGNOSIS — M1990 Unspecified osteoarthritis, unspecified site: Secondary | ICD-10-CM | POA: Diagnosis present

## 2016-04-04 DIAGNOSIS — I2699 Other pulmonary embolism without acute cor pulmonale: Secondary | ICD-10-CM | POA: Diagnosis not present

## 2016-04-04 DIAGNOSIS — I10 Essential (primary) hypertension: Secondary | ICD-10-CM | POA: Diagnosis not present

## 2016-04-04 DIAGNOSIS — Z9989 Dependence on other enabling machines and devices: Secondary | ICD-10-CM

## 2016-04-04 DIAGNOSIS — R51 Headache: Secondary | ICD-10-CM | POA: Diagnosis not present

## 2016-04-04 DIAGNOSIS — I4581 Long QT syndrome: Secondary | ICD-10-CM | POA: Diagnosis not present

## 2016-04-04 DIAGNOSIS — T8189XA Other complications of procedures, not elsewhere classified, initial encounter: Secondary | ICD-10-CM | POA: Diagnosis not present

## 2016-04-04 DIAGNOSIS — G4733 Obstructive sleep apnea (adult) (pediatric): Secondary | ICD-10-CM

## 2016-04-04 DIAGNOSIS — K219 Gastro-esophageal reflux disease without esophagitis: Secondary | ICD-10-CM | POA: Diagnosis present

## 2016-04-04 DIAGNOSIS — Z86718 Personal history of other venous thrombosis and embolism: Secondary | ICD-10-CM

## 2016-04-04 DIAGNOSIS — D649 Anemia, unspecified: Secondary | ICD-10-CM | POA: Diagnosis present

## 2016-04-04 DIAGNOSIS — Z8249 Family history of ischemic heart disease and other diseases of the circulatory system: Secondary | ICD-10-CM

## 2016-04-04 DIAGNOSIS — Z79891 Long term (current) use of opiate analgesic: Secondary | ICD-10-CM

## 2016-04-04 DIAGNOSIS — E876 Hypokalemia: Secondary | ICD-10-CM | POA: Diagnosis not present

## 2016-04-04 DIAGNOSIS — R55 Syncope and collapse: Secondary | ICD-10-CM

## 2016-04-04 DIAGNOSIS — F419 Anxiety disorder, unspecified: Secondary | ICD-10-CM | POA: Diagnosis present

## 2016-04-04 DIAGNOSIS — Z86711 Personal history of pulmonary embolism: Secondary | ICD-10-CM

## 2016-04-04 DIAGNOSIS — F5101 Primary insomnia: Secondary | ICD-10-CM | POA: Diagnosis present

## 2016-04-04 DIAGNOSIS — M542 Cervicalgia: Secondary | ICD-10-CM | POA: Diagnosis not present

## 2016-04-04 DIAGNOSIS — Z7982 Long term (current) use of aspirin: Secondary | ICD-10-CM

## 2016-04-04 DIAGNOSIS — T8131XA Disruption of external operation (surgical) wound, not elsewhere classified, initial encounter: Secondary | ICD-10-CM | POA: Diagnosis not present

## 2016-04-04 DIAGNOSIS — Z7951 Long term (current) use of inhaled steroids: Secondary | ICD-10-CM

## 2016-04-04 DIAGNOSIS — Z96649 Presence of unspecified artificial hip joint: Secondary | ICD-10-CM | POA: Diagnosis present

## 2016-04-04 DIAGNOSIS — I739 Peripheral vascular disease, unspecified: Secondary | ICD-10-CM | POA: Diagnosis present

## 2016-04-04 DIAGNOSIS — Z79899 Other long term (current) drug therapy: Secondary | ICD-10-CM

## 2016-04-04 LAB — CBG MONITORING, ED: Glucose-Capillary: 102 mg/dL — ABNORMAL HIGH (ref 65–99)

## 2016-04-04 LAB — BASIC METABOLIC PANEL
Anion gap: 9 (ref 5–15)
BUN: 22 mg/dL — AB (ref 6–20)
CHLORIDE: 98 mmol/L — AB (ref 101–111)
CO2: 29 mmol/L (ref 22–32)
CREATININE: 0.95 mg/dL (ref 0.44–1.00)
Calcium: 9.6 mg/dL (ref 8.9–10.3)
GFR calc Af Amer: >60 mL/min (ref >60)
GFR calc non Af Amer: >60 mL/min (ref >60)
Glucose, Bld: 117 mg/dL — ABNORMAL HIGH (ref 65–99)
POTASSIUM: 4.2 mmol/L (ref 3.5–5.1)
Sodium: 136 mmol/L (ref 135–145)

## 2016-04-04 LAB — CBC
HEMATOCRIT: 39 % (ref 36.0–46.0)
Hemoglobin: 12.8 g/dL (ref 12.0–15.0)
MCH: 29.1 pg (ref 26.0–34.0)
MCHC: 32.8 g/dL (ref 30.0–36.0)
MCV: 88.6 fL (ref 78.0–100.0)
PLATELETS: 297 10*3/uL (ref 150–400)
RBC: 4.4 MIL/uL (ref 3.87–5.11)
RDW: 13.6 % (ref 11.5–15.5)
WBC: 13.1 10*3/uL — ABNORMAL HIGH (ref 4.0–10.5)

## 2016-04-04 LAB — APTT: APTT: 25 s (ref 24–36)

## 2016-04-04 LAB — URINALYSIS, ROUTINE W REFLEX MICROSCOPIC
Bilirubin Urine: NEGATIVE
GLUCOSE, UA: NEGATIVE mg/dL
HGB URINE DIPSTICK: NEGATIVE
Ketones, ur: NEGATIVE mg/dL
Leukocytes, UA: NEGATIVE
Nitrite: NEGATIVE
PH: 6.5 (ref 5.0–8.0)
PROTEIN: NEGATIVE mg/dL
Specific Gravity, Urine: 1.02 (ref 1.005–1.030)

## 2016-04-04 LAB — I-STAT BETA HCG BLOOD, ED (MC, WL, AP ONLY): I-stat hCG, quantitative: <5 m[IU]/mL (ref <5)

## 2016-04-04 LAB — PROTIME-INR
INR: 1.05
PROTHROMBIN TIME: 13.7 s (ref 11.4–15.2)

## 2016-04-04 LAB — BRAIN NATRIURETIC PEPTIDE: B NATRIURETIC PEPTIDE 5: 17.2 pg/mL (ref 0.0–100.0)

## 2016-04-04 MED ORDER — ACETAMINOPHEN 650 MG RE SUPP: 650.0000 mg | Freq: Four times a day (QID) | RECTAL | Status: DC | PRN

## 2016-04-04 MED ORDER — ONDANSETRON HCL 4 MG PO TABS: 4.0000 mg | ORAL_TABLET | Freq: Four times a day (QID) | ORAL | Status: DC | PRN

## 2016-04-04 MED ORDER — SPIRONOLACTONE 25 MG PO TABS
25.0000 mg | ORAL_TABLET | Freq: Every day | ORAL | Status: DC
  Administered 2016-04-05 – 2016-04-07 (×3): 25 mg via ORAL
  Filled 2016-04-04 (×3): qty 1

## 2016-04-04 MED ORDER — HEPARIN (PORCINE) IN NACL 100-0.45 UNIT/ML-% IJ SOLN
1400.0000 [IU]/h | INTRAMUSCULAR | Status: DC
  Administered 2016-04-04 – 2016-04-05 (×2): 1400 [IU]/h via INTRAVENOUS
  Filled 2016-04-04 (×2): qty 250

## 2016-04-04 MED ORDER — TRIAMTERENE-HCTZ 75-50 MG PO TABS
1.0000 | ORAL_TABLET | Freq: Every day | ORAL | Status: DC
  Administered 2016-04-05 – 2016-04-07 (×3): 1 via ORAL
  Filled 2016-04-04 (×3): qty 1

## 2016-04-04 MED ORDER — PREDNISONE 5 MG PO TABS
5.0000 mg | ORAL_TABLET | Freq: Every day | ORAL | Status: AC
  Administered 2016-04-05 (×2): 5 mg via ORAL
  Filled 2016-04-04 (×2): qty 1

## 2016-04-04 MED ORDER — ASPIRIN 81 MG PO CHEW
81.0000 mg | CHEWABLE_TABLET | Freq: Every day | ORAL | Status: DC
  Administered 2016-04-05 – 2016-04-06 (×2): 81 mg via ORAL
  Filled 2016-04-04 (×2): qty 1

## 2016-04-04 MED ORDER — ALBUTEROL SULFATE (2.5 MG/3ML) 0.083% IN NEBU: 2.5000 mg | INHALATION_SOLUTION | RESPIRATORY_TRACT | Status: DC | PRN

## 2016-04-04 MED ORDER — ONDANSETRON HCL 4 MG/2ML IJ SOLN: 4.0000 mg | Freq: Four times a day (QID) | INTRAMUSCULAR | Status: DC | PRN

## 2016-04-04 MED ORDER — MORPHINE SULFATE (PF) 2 MG/ML IV SOLN
2.0000 mg | Freq: Once | INTRAVENOUS | Status: AC
  Administered 2016-04-04: 2 mg via INTRAVENOUS
  Filled 2016-04-04: qty 1

## 2016-04-04 MED ORDER — ZOLPIDEM TARTRATE 5 MG PO TABS
5.0000 mg | ORAL_TABLET | Freq: Every evening | ORAL | Status: DC | PRN
  Administered 2016-04-05 – 2016-04-07 (×2): 5 mg via ORAL
  Filled 2016-04-04 (×2): qty 1

## 2016-04-04 MED ORDER — OXYCODONE-ACETAMINOPHEN 10-325 MG PO TABS
1.0000 | ORAL_TABLET | ORAL | Status: DC | PRN
Start: 2016-04-04 — End: 2016-04-05

## 2016-04-04 MED ORDER — SODIUM CHLORIDE 0.9 % IV BOLUS (SEPSIS)
500.0000 mL | Freq: Once | INTRAVENOUS | Status: AC
  Administered 2016-04-04: 500 mL via INTRAVENOUS

## 2016-04-04 MED ORDER — LORATADINE 10 MG PO TABS
10.0000 mg | ORAL_TABLET | Freq: Every day | ORAL | Status: DC
  Administered 2016-04-05 – 2016-04-07 (×3): 10 mg via ORAL
  Filled 2016-04-04 (×3): qty 1

## 2016-04-04 MED ORDER — PANTOPRAZOLE SODIUM 40 MG PO TBEC
40.0000 mg | DELAYED_RELEASE_TABLET | Freq: Every day | ORAL | Status: DC
  Administered 2016-04-05 – 2016-04-07 (×3): 40 mg via ORAL
  Filled 2016-04-04 (×3): qty 1

## 2016-04-04 MED ORDER — IOPAMIDOL (ISOVUE-370) INJECTION 76%
INTRAVENOUS | Status: AC
  Filled 2016-04-04: qty 100

## 2016-04-04 MED ORDER — ACETAMINOPHEN 325 MG PO TABS: 650.0000 mg | ORAL_TABLET | Freq: Four times a day (QID) | ORAL | Status: DC | PRN

## 2016-04-04 MED ORDER — FERROUS SULFATE 325 (65 FE) MG PO TABS
325.0000 mg | ORAL_TABLET | Freq: Every day | ORAL | Status: DC
  Administered 2016-04-05 – 2016-04-07 (×3): 325 mg via ORAL
  Filled 2016-04-04 (×3): qty 1

## 2016-04-04 MED ORDER — IOPAMIDOL (ISOVUE-370) INJECTION 76%
100.0000 mL | Freq: Once | INTRAVENOUS | Status: AC | PRN
  Administered 2016-04-04: 100 mL via INTRAVENOUS

## 2016-04-04 MED ORDER — LABETALOL HCL 200 MG PO TABS
200.0000 mg | ORAL_TABLET | Freq: Two times a day (BID) | ORAL | Status: DC
  Administered 2016-04-05 – 2016-04-07 (×6): 200 mg via ORAL
  Filled 2016-04-04 (×2): qty 2
  Filled 2016-04-04: qty 1
  Filled 2016-04-04: qty 2
  Filled 2016-04-04 (×3): qty 1
  Filled 2016-04-04: qty 2
  Filled 2016-04-04: qty 1
  Filled 2016-04-04 (×3): qty 2
  Filled 2016-04-04: qty 1

## 2016-04-04 MED ORDER — HYOSCYAMINE SULFATE 0.125 MG SL SUBL
0.1250 mg | SUBLINGUAL_TABLET | Freq: Two times a day (BID) | SUBLINGUAL | Status: DC | PRN
  Filled 2016-04-04: qty 1

## 2016-04-04 MED ORDER — ESCITALOPRAM OXALATE 10 MG PO TABS
20.0000 mg | ORAL_TABLET | Freq: Every day | ORAL | Status: DC
  Administered 2016-04-05 – 2016-04-07 (×3): 20 mg via ORAL
  Filled 2016-04-04 (×3): qty 2

## 2016-04-04 MED ORDER — ADULT MULTIVITAMIN W/MINERALS CH
1.0000 | ORAL_TABLET | Freq: Every day | ORAL | Status: DC
  Administered 2016-04-05 – 2016-04-07 (×3): 1 via ORAL
  Filled 2016-04-04 (×3): qty 1

## 2016-04-04 MED ORDER — POTASSIUM CHLORIDE CRYS ER 20 MEQ PO TBCR: 20.0000 meq | EXTENDED_RELEASE_TABLET | Freq: Every day | ORAL | Status: DC

## 2016-04-04 MED ORDER — MOMETASONE FURO-FORMOTEROL FUM 200-5 MCG/ACT IN AERO
2.0000 | INHALATION_SPRAY | Freq: Two times a day (BID) | RESPIRATORY_TRACT | Status: DC
  Administered 2016-04-05 – 2016-04-07 (×4): 2 via RESPIRATORY_TRACT
  Filled 2016-04-04 (×2): qty 8.8

## 2016-04-04 MED ORDER — SODIUM CHLORIDE 0.9 % IJ SOLN
INTRAMUSCULAR | Status: AC
  Administered 2016-04-04: 1 mL
  Filled 2016-04-04: qty 50

## 2016-04-04 MED ORDER — CYCLOBENZAPRINE HCL 10 MG PO TABS
5.0000 mg | ORAL_TABLET | Freq: Three times a day (TID) | ORAL | Status: DC | PRN
  Administered 2016-04-05 – 2016-04-07 (×3): 5 mg via ORAL
  Filled 2016-04-04 (×3): qty 1

## 2016-04-04 MED ORDER — HEPARIN BOLUS VIA INFUSION
2000.0000 [IU] | Freq: Once | INTRAVENOUS | Status: AC
  Administered 2016-04-04: 2000 [IU] via INTRAVENOUS
  Filled 2016-04-04: qty 2000

## 2016-04-04 NOTE — ED Triage Notes (Addendum)
Pt fell at the [employer] earlier today.  Pt had cervical fusion last week due to osteoarthritis.  Pt c/o pain in neck and R shoulder.  BP 123/86 P71 RR18 O2 95% on RA

## 2016-04-04 NOTE — ED Triage Notes (Signed)
[place] [employer] [place]. Patient states "when she woke up she yelled for help until someone heard her." Patient recently had a cervical fusion and is currently wearing a soft collar.

## 2016-04-04 NOTE — H&P (Signed)
History and Physical    OLIANA [name] [id number] DOB: 1965/05/26 DOA: 04/04/2016  Referring MD/NP/PA: Gay Filler PA-C PCP: [name], NP  Patient coming from: Home via  Chief Complaint: Syncope   HPI: [name] is a 50 y.o. female with medical history significant of DVT/PE, OSA on CPAP, GERD; who presents after having syncopal episode. Patient reports that she was getting out of [place] and remember feeling her legs shaking prior to passing out. The next thing she recalls is waking up on the ground calling for help. The episode was apparently unwitnessed. EMS was called out, but she declined transport. Subsequently, she went home, but began feeling shaky again as though she may pass out and called for EMS. Associated symptoms include shortness of breath she notes has been ongoing over the last 3 months. She was going to be evaluated with a echocardiogram by her cardiologist on this coming Thursday. She also notes that on 11/28, she underwent a cervical spinal fusion by Dr. Loel Dubonnet. Following the procedure she was not placed on any anticoagulation agents. Reports that she had been able to get up and around without much difficulty following the surgery and was not sedentary. Patient has a previous history of DVT and PE for which she had been on Eliquis up until 2007. Patient is sure why it was discontinued and never restarted. Denies any significant cough, chest pain, nausea, vomiting, abdominal pain, or history of bleeding.   ED Course: Upon admission patient was seen vital signs relatively within normal limits. Lab results revealed normal BNP. CT angiogram of the chest revealed bilateral pulmonary emboli. Patient was started on heparin drip per protocol.  Review of Systems: As per HPI otherwise 10 point review of systems negative.   Past Medical History:  Diagnosis Date  . Anemia   . Anxiety   . Arthritis    osteoarthritis  . Atypical chest  pain    negative Myoview in 2011 with no ischemi and normal LV function.   . Depression   . DVT (deep venous thrombosis) ([place])   . GERD (gastroesophageal reflux disease)    uses Nexium  . HTN (hypertension)   . Obesity   . OSA (obstructive sleep apnea)    uses C-PAP  . PE (pulmonary embolism)   . Peripheral vascular disease ([place])    blood clot in right leg  . Prolonged QT interval    gene positive  . Seizures ([place])    hasn't had any for 25 years    Past Surgical History:  Procedure Laterality Date  . ANTERIOR CERVICAL DECOMP/DISCECTOMY FUSION N/A 06/25/2013   Procedure: ANTERIOR CERVICAL DECOMPRESSION/DISCECTOMY FUSION 2 LEVELS LEVELS C FOUR/FIVE, SIX/SEVEN;  Surgeon: Floyce Stakes, MD;  Location: [facility];  Service: Neurosurgery;  Laterality: N/A;  . ANTERIOR CERVICAL DECOMP/DISCECTOMY FUSION N/A 03/29/2016   Procedure: CERVICAL THREE-FOUR, CERVICAL FOUR-FIVE ANTERIOR CERVICAL DECOMPRESSION/DISCECTOMY/FUSION WITH REMOVAL OF PLATES AT D34-534 D34-534;  Surgeon: Leeroy Cha, MD;  Location: [facility];  Service: Neurosurgery;  Laterality: N/A;  C3-4 C5-6 ANTERIOR CERVICAL DECOMPRESSION/DISCECTOMY/FUSION WITH POSSIBLE REMOVAL OF PLATE AT D34-534 D34-534  . CARDIAC CATHETERIZATION    . COLONOSCOPY    . DILATION AND CURETTAGE OF UTERUS    . ESOPHAGOGASTRODUODENOSCOPY (EGD) WITH PROPOFOL N/A 11/17/2015   Procedure: ESOPHAGOGASTRODUODENOSCOPY (EGD) WITH PROPOFOL;  Surgeon: Lollie Sails, MD;  Location: [facility];  Service: Endoscopy;  Laterality: N/A;  . left foot surgery    . left hand  surgery x 2    . LUMBAR DISC SURGERY    . right hand surgery for torn ligament    . TOTAL HIP ARTHROPLASTY       reports that she quit smoking about 14 years ago. She has never used smokeless tobacco. She reports that she drinks about 1.2 oz of alcohol per week . She reports that she does not use drugs.  No Known Allergies  Family History  Problem Relation Age of Onset  . Long QT syndrome Mother     . CAD Mother   . Diabetes Father   . CAD Father   . Diabetes Brother   . Breast cancer Maternal Aunt 70  . Breast cancer Paternal Aunt   . Breast cancer Maternal Aunt 40    Prior to Admission medications   Medication Sig Start Date End Date Taking? Authorizing Provider  albuterol (PROVENTIL HFA;VENTOLIN HFA) 108 (90 BASE) MCG/ACT inhaler Inhale 1-2 puffs into the lungs every 4 (four) hours as needed for wheezing or shortness of breath.   Yes Historical Provider, MD  aspirin 81 MG tablet Take 81 mg by mouth daily.     Yes Historical Provider, MD  cyclobenzaprine (FLEXERIL) 10 MG tablet Take 5 mg by mouth 3 (three) times daily as needed for muscle spasms.   Yes Historical Provider, MD  escitalopram (LEXAPRO) 20 MG tablet Take 20 mg by mouth daily.  10/03/13  Yes Historical Provider, MD  esomeprazole (NEXIUM) 40 MG capsule Take 1 capsule (40 mg total) by mouth at bedtime. [id number]  Yes Delora Fuel, MD  Ferrous Sulfate (SLOW RELEASE IRON PO) Take 1 tablet by mouth daily.    Yes Historical Provider, MD  fluocinonide (LIDEX) 0.05 % external solution Apply 1 application topically 2 (two) times daily. 12/30/11  Yes Historical Provider, MD  furosemide (LASIX) 20 MG tablet TAKE (1) TABLET BY MOUTH EVERY DAY 10/31/14  Yes Glean Hess, MD  Glucosamine-Chondroit-Vit C-Mn (GLUCOSAMINE 1500 COMPLEX) CAPS Take 1 each by mouth daily.   Yes Historical Provider, MD  hyoscyamine (LEVSIN SL) 0.125 MG SL tablet Take 1 tablet by mouth 2 (two) times daily as needed. For nausea 04/03/14  Yes Historical Provider, MD  labetalol (NORMODYNE) 200 MG tablet Take 1 tablet (200 mg total) by mouth 2 (two) times daily. Please keep 04/20/16 appointment for further refills 01/20/16  Yes Deboraha Sprang, MD  loratadine (CLARITIN) 10 MG tablet Take 10 mg by mouth daily.   Yes Historical Provider, MD  Multiple Vitamins-Minerals (MULTIVITAMIN WITH MINERALS) tablet Take 1 tablet by mouth daily.   Yes Historical Provider, MD  nabumetone  (RELAFEN) 750 MG tablet Take 1 tablet (750 mg total) by mouth 2 (two) times daily. 02/23/16  Yes Glean Hess, MD  Omega-3 Fatty Acids (FISH OIL) 1000 MG CAPS Take 1 capsule by mouth 3 (three) times daily.     Yes Historical Provider, MD  oxyCODONE-acetaminophen (PERCOCET) 10-325 MG tablet Take 1 tablet by mouth every 4 (four) hours as needed for pain.   Yes Historical Provider, MD  potassium chloride SA (K-DUR,KLOR-CON) 20 MEQ tablet Take 1 tablet (20 mEq total) by mouth daily. 04/08/14  Yes Thayer Headings, MD  predniSONE (STERAPRED UNI-PAK 21 TAB) 5 MG (21) TBPK tablet Take 5 mg by mouth as directed. 6 day   Yes Historical Provider, MD  spironolactone (ALDACTONE) 25 MG tablet Take 1 tablet (25 mg total) by mouth daily. 05/08/14  Yes Deboraha Sprang, MD  triamterene-hydrochlorothiazide (MAXZIDE) 75-50 MG  per tablet Take 1 tablet by mouth daily. 04/07/14  Yes Historical Provider, MD  zolpidem (AMBIEN) 10 MG tablet Take 10 mg by mouth at bedtime as needed for sleep.   Yes Historical Provider, MD  budesonide-formoterol (SYMBICORT) 160-4.5 MCG/ACT inhaler Inhale 2 puffs into the lungs 2 (two) times daily as needed (for shortness of breath).    Historical Provider, MD    Physical Exam:   Constitutional: NAD, calm, comfortable Vitals:   04/04/16 2101 04/04/16 2130 04/04/16 2200 04/04/16 2230  BP: 121/77 125/65 140/80 123/59  Pulse: 65 67 73 82  Resp: 12 21 11 23   Temp:      TempSrc:      SpO2: 100% 98% 98% 99%  Weight:      Height:       Eyes: PERRL, lids and conjunctivae normal ENMT: Mucous membranes are moist. Posterior pharynx clear of any exudate or lesions.Normal dentition.  Neck: normal, supple, no masses, no thyromegaly Respiratory: clear to auscultation bilaterally, no wheezing, no crackles. Normal respiratory effort. No accessory muscle use.  Cardiovascular: Regular rate and rhythm, no murmurs / rubs / gallops. No extremity edema. 2+ pedal pulses. No carotid bruits.  Abdomen: no  tenderness, no masses palpated. No hepatosplenomegaly. Bowel sounds positive.  Musculoskeletal: no clubbing / cyanosis. No joint deformity upper and lower extremities. Good ROM, no contractures. Normal muscle tone.  Skin: Surgical wounds healing well with no signs of erythema or redness on the anterior neck.  Neurologic: CN 2-12 grossly intact. Sensation intact, DTR normal. Strength 5/5 in all 4.  Psychiatric: Normal judgment and insight. Alert and oriented x 3. Normal mood.     Labs on Admission: I have personally reviewed following labs and imaging studies  CBC:  Recent Labs Lab 03/29/16 0744 04/04/16 1738  WBC 6.0 13.1*  HGB 11.0* 12.8  HCT 33.8* 39.0  MCV 87.6 88.6  PLT 214 [id number]   Basic Metabolic Panel:  Recent Labs Lab 03/29/16 0744 04/04/16 1738  NA 138 136  K 3.3* 4.2  CL 105 98*  CO2 25 29  GLUCOSE 99 117*  BUN 12 22*  CREATININE 0.94 0.95  CALCIUM 9.1 9.6   GFR: Estimated Creatinine Clearance: 94.5 mL/min (by C-G formula based on SCr of 0.95 mg/dL). Liver Function Tests: No results for input(s): AST, ALT, ALKPHOS, BILITOT, PROT, ALBUMIN in the last 168 hours. No results for input(s): LIPASE, AMYLASE in the last 168 hours. No results for input(s): AMMONIA in the last 168 hours. Coagulation Profile: No results for input(s): INR, PROTIME in the last 168 hours. Cardiac Enzymes: No results for input(s): CKTOTAL, CKMB, CKMBINDEX, TROPONINI in the last 168 hours. BNP (last 3 results) No results for input(s): PROBNP in the last 8760 hours. HbA1C: No results for input(s): HGBA1C in the last 72 hours. CBG:  Recent Labs Lab 04/04/16 1753  GLUCAP 102*   Lipid Profile: No results for input(s): CHOL, HDL, LDLCALC, TRIG, CHOLHDL, LDLDIRECT in the last 72 hours. Thyroid Function Tests: No results for input(s): TSH, T4TOTAL, FREET4, T3FREE, THYROIDAB in the last 72 hours. Anemia Panel: No results for input(s): VITAMINB12, FOLATE, FERRITIN, TIBC, IRON, RETICCTPCT  in the last 72 hours. Urine analysis:    Component Value Date/Time   COLORURINE YELLOW 04/04/2016 1813   APPEARANCEUR CLEAR 04/04/2016 1813   APPEARANCEUR Hazy 03/09/2014 1217   LABSPEC 1.020 04/04/2016 1813   LABSPEC 1.030 03/09/2014 1217   PHURINE 6.5 04/04/2016 1813   GLUCOSEU NEGATIVE 04/04/2016 1813   GLUCOSEU Negative 03/09/2014 1217  HGBUR NEGATIVE 04/04/2016 1813   BILIRUBINUR NEGATIVE 04/04/2016 1813   BILIRUBINUR 1 06/11/2015 1248   BILIRUBINUR Negative 03/09/2014 1217   KETONESUR NEGATIVE 04/04/2016 1813   PROTEINUR NEGATIVE 04/04/2016 1813   UROBILINOGEN 0.2 06/11/2015 1248   NITRITE NEGATIVE 04/04/2016 1813   LEUKOCYTESUR NEGATIVE 04/04/2016 1813   LEUKOCYTESUR Negative 03/09/2014 1217   Sepsis Labs: Recent Results (from the past 240 hour(s))  Surgical pcr screen     Status: None   Collection Time: 03/29/16  8:03 AM  Result Value Ref Range Status   MRSA, PCR NEGATIVE NEGATIVE Final   Staphylococcus aureus NEGATIVE NEGATIVE Final    Comment:        The Xpert SA Assay (FDA approved for NASAL specimens in patients over 38 years of age), is one component of a comprehensive surveillance program.  Test performance has been validated by [facility] for patients greater than or equal to 37 year old. It is not intended to diagnose infection nor to guide or monitor treatment.      Radiological Exams on Admission: Ct Head Wo Contrast  Result Date: 04/04/2016 CLINICAL DATA:  Syncope, head struck ground. Neck pain with recent cervical surgery. 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 EXAM: CT HEAD WITHOUT CONTRAST CT CERVICAL SPINE WITHOUT CONTRAST TECHNIQUE: Multidetector CT imaging of the head and cervical spine was performed following the standard protocol without intravenous contrast. Multiplanar CT image reconstructions of the cervical spine were also generated. COMPARISON:  CT cervical spine 12/01/2015 FINDINGS: CT HEAD FINDINGS Brain: No evidence of acute  infarction, hemorrhage, hydrocephalus, extra-axial collection or mass lesion/mass effect. Vascular: No hyperdense vessel or unexpected calcification. Skull: Normal. Negative for fracture or focal lesion. Sinuses/Orbits: Well-defined low-density within the left maxillary sinus may be mucous retention cysts or polyp. No acute inflammation or evidence of fracture. Other: None. CT CERVICAL SPINE FINDINGS Alignment: Mild straightening of normal lordosis, no jumped or perched facets. Skull base and vertebrae: No skullbase fracture. Anterior fusion C3-C4 with interbody spacer C4 screw extends into superior C5 vertebral body. Anterior fusion C5-C6 with interbody spacer. The C5-C6 interbody spacer is offset to the right, without encroachment on the vertebral canal. Ghost tracks at C4, C5, C6 and C7 from prior hardware. No acute fracture. Soft tissues and spinal canal: No evidence of spinal canal hematoma or prevertebral hematoma. Soft tissue thickening of the prevertebral soft tissue is likely sequela of recent surgery. Disc levels: Postsurgical change as described. Prior C4-C5 and C6-C7 fusion with removal of hardware. Upper chest: No acute abnormality. Other: None. IMPRESSION: 1.  No acute intracranial abnormality. 2. Postsurgical change of the cervical spine with ADCF C3-C4 and C5-C6. Interbody spacer at C5-C6 is offset to the right, without herniation or spinal canal compromise. No evidence of acute postsurgical complication. No cervical spine fracture. 3. Incidental mucous retention cyst or polyp in the left maxillary sinus. Electronically Signed   By: Jeb Levering M.D.   On: 04/04/2016 20:19   Ct Angio Chest Pe W/cm &/or Wo Cm  Result Date: 04/04/2016 CLINICAL DATA:  50 year old female with shortness of breath for several days. EXAM: CT ANGIOGRAPHY CHEST WITH CONTRAST TECHNIQUE: Multidetector CT imaging of the chest was performed using the standard protocol during bolus administration of intravenous contrast.  Multiplanar CT image reconstructions and MIPs were obtained to evaluate the vascular anatomy. CONTRAST:  100 cc intravenous Isovue 370 COMPARISON:  03/16/2016 and prior radiographs. 12/17/2015 and prior CTs FINDINGS: Cardiovascular: Bilateral pulmonary emboli are identified involving the majority of the  segmental arteries. Cardiomegaly is present. The RV-LV ratio is 0.75. There is no evidence of thoracic aortic aneurysm or definite dissection. No pericardial effusion is present. Mediastinum/Nodes: No enlarged mediastinal, hilar, or axillary lymph nodes. Thyroid gland, trachea, and esophagus demonstrate no significant findings. Lungs/Pleura: Lungs are clear. No pleural effusion or pneumothorax. Upper Abdomen: No acute abnormality. Musculoskeletal: No acute or suspicious abnormality. Review of the MIP images confirms the above findings. IMPRESSION: Bilateral pulmonary emboli.  No CT evidence of right heart strain. Cardiomegaly. Critical Value/emergent results were called by telephone at the time of interpretation on 04/04/2016 at 9:52 pm to [facility] , who verbally acknowledged these results. Electronically Signed   By: Margarette Canada M.D.   On: 04/04/2016 21:54   Ct Cervical Spine Wo Contrast  Result Date: 04/04/2016 CLINICAL DATA:  Syncope, head struck ground. Neck pain with recent cervical surgery. 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 EXAM: CT HEAD WITHOUT CONTRAST CT CERVICAL SPINE WITHOUT CONTRAST TECHNIQUE: Multidetector CT imaging of the head and cervical spine was performed following the standard protocol without intravenous contrast. Multiplanar CT image reconstructions of the cervical spine were also generated. COMPARISON:  CT cervical spine 12/01/2015 FINDINGS: CT HEAD FINDINGS Brain: No evidence of acute infarction, hemorrhage, hydrocephalus, extra-axial collection or mass lesion/mass effect. Vascular: No hyperdense vessel or unexpected calcification. Skull: Normal. Negative for fracture or focal  lesion. Sinuses/Orbits: Well-defined low-density within the left maxillary sinus may be mucous retention cysts or polyp. No acute inflammation or evidence of fracture. Other: None. CT CERVICAL SPINE FINDINGS Alignment: Mild straightening of normal lordosis, no jumped or perched facets. Skull base and vertebrae: No skullbase fracture. Anterior fusion C3-C4 with interbody spacer C4 screw extends into superior C5 vertebral body. Anterior fusion C5-C6 with interbody spacer. The C5-C6 interbody spacer is offset to the right, without encroachment on the vertebral canal. Ghost tracks at C4, C5, C6 and C7 from prior hardware. No acute fracture. Soft tissues and spinal canal: No evidence of spinal canal hematoma or prevertebral hematoma. Soft tissue thickening of the prevertebral soft tissue is likely sequela of recent surgery. Disc levels: Postsurgical change as described. Prior C4-C5 and C6-C7 fusion with removal of hardware. Upper chest: No acute abnormality. Other: None. IMPRESSION: 1.  No acute intracranial abnormality. 2. Postsurgical change of the cervical spine with ADCF C3-C4 and C5-C6. Interbody spacer at C5-C6 is offset to the right, without herniation or spinal canal compromise. No evidence of acute postsurgical complication. No cervical spine fracture. 3. Incidental mucous retention cyst or polyp in the left maxillary sinus. Electronically Signed   By: Jeb Levering M.D.   On: 04/04/2016 20:19    EKG: Independently reviewed. Sinus rhythm with short PR interval and QTC 487  Assessment/Plan Syncope secondary to bilateral pulmonary embolus: Acute. Fusion procedure on 11/28 - Admit to a telemetry bed   - Heparin per pharmacy  - Nasal cannula oxygen as needed for shortness of breath - Check echocardiogram and Doppler ultrasound of lower extremities  - Determine which anticoagulation agent is best suited for the patient  [place] substitution of Dulera for Symbicort - Albuterol nebs prn  shortness of breath   Status post cervical spinal fusion: Patient just recently had spinal fusion on 11/28 - Continue steriod taper, Flexeril prn muscle spasms, and oxycodone prn pain  Osteoarthritis - Did not restart Nabumetone due to possible increased risk of bleeding  History of prolonged QTC: Chronic. - Continue to monitor   Essential hypertension - Continue labetalol,  Maxzide, spironolactone - Did not continue Lasix  OSA on CPAP - RT to supply CPAP at night   History of DVT/PE - As seen above  Insomnia - Continue Ambien   GERD - Pharmacy substitution of Protonix to Nexium   DVT prophylaxis:  heparin  Code Status:  Full  Family Communication:  no family present at bedside  Disposition Plan:  Likely discharge home once medically stable  Consults called:  none  Admission status: Observation telemetry   Norval Morton MD [facility] Pager [phone]  If 7PM-7AM, please contact night-coverage [email or web address] Password [email or web address]  04/04/2016, 10:41 PM

## 2016-04-04 NOTE — ED Notes (Signed)
ED Provider at bedside. 

## 2016-04-04 NOTE — Progress Notes (Signed)
ANTICOAGULATION CONSULT NOTE - Initial Consult  Pharmacy Consult for heparin Indication: pulmonary embolus  No Known Allergies  Patient Measurements: Height: 5\' 4"  (162.6 cm) Weight: 285 lb (129.3 kg) IBW/kg (Calculated) : 54.7 Heparin Dosing Weight: 87kg  Vital Signs: Temp: 98.2 F (36.8 C) (12/04 1703) Temp Source: Oral (12/04 1703) BP: 125/65 (12/04 2130) Pulse Rate: 67 (12/04 2130)  Labs:  Recent Labs  04/04/16 1738  HGB 12.8  HCT 39.0  PLT 297  CREATININE 0.95    Estimated Creatinine Clearance: 94.5 mL/min (by C-G formula based on SCr of 0.95 mg/dL).   Medical History: Past Medical History:  Diagnosis Date  . Anemia   . Anxiety   . Arthritis    osteoarthritis  . Atypical chest pain    negative Myoview in 2011 with no ischemi and normal LV function.   . Depression   . DVT (deep venous thrombosis) ([place])   . GERD (gastroesophageal reflux disease)    uses Nexium  . HTN (hypertension)   . Obesity   . OSA (obstructive sleep apnea)    uses C-PAP  . PE (pulmonary embolism)   . Peripheral vascular disease ([place])    blood clot in right leg  . Prolonged QT interval    gene positive  . Seizures ([place])    hasn't had any for 25 years    Assessment: 10 YOF s/o syncopal episode at [employer].  She has h/o DVT and PE - not on anticoagulation.  She recently had spinal fusion on 11/28.  CTA reveals bilateral PE  Today, 04/04/2016:  Baseline INR and aPTT pending  CBC Hgb and Pltc WNL  Goal of Therapy:  Heparin level 0.3-0.7 units/ml Monitor platelets by anticoagulation protocol: Yes   Plan:   Heparin 2000 units (1/2 dose bolus for recent surgery) then heparin 1400 units/hr  Heparin level in 6h   Daily heparin level and CBC  Monitor for bleeding  Await final decision re: long-term anticoagulation  Doreene Eland, PharmD, BCPS.   Pager: [phone] 04/04/2016 10:25 PM

## 2016-04-04 NOTE — ED Provider Notes (Signed)
[facility] DEPT Provider Note   CSN: [id number] Arrival date & time: 04/04/16  1642     History   Chief Complaint Chief Complaint  Patient presents with  . Neck Pain  . Loss of Consciousness    HPI [name] is a 50 y.o. female.  [name] is a 50 y.o. female with h/o anemia, anxiety, arthritis, depression, DVT - left popliteal vein, PE, obesity, HTN, seizures, cervical spondylosis with radiculopathy presents to ED with complaint of syncopal episode. Patient reports she was getting out of her car [place] the post[place]office when she felt lightheaded, diaphoretic, and shaky legs. The next thing she remembers is waking up on the ground and calling for help. Episode was unwitnessed. She was evaluated at the scene by EMS and subsequently went home. However, at home she stated her legs started feeling shaky again and she called EMS and was transported to the ED. She also reports neck pain. Patient recently underwent cervical spinal fusion on 03/29/16 and is currently in a soft collar. She currently denies any fever, trouble swallowing, visual disturbances, CP, SOB, abdominal pain, N/V, dysuria, hematuria, rashes, numbness/weakness, or any other pain. She does endorse chronic lower extremity swelling; however, denies pain. She also states that she has had increasing SOB prior to her surgery and is scheduled to see her cardiologist on Thursday for an echocardiogram. She reports prior history of DVT and PE; however, is not currently on anti-coagulation therapy. She denies any h/o high cholesterol, diabetes, of cardiac hx. Family hx of CAD.       Past Medical History:  Diagnosis Date  . Anemia   . Anxiety   . Arthritis    osteoarthritis  . Atypical chest pain    negative Myoview in 2011 with no ischemi and normal LV function.   . Depression   . DVT (deep venous thrombosis) ([place])   . GERD (gastroesophageal reflux disease)    uses Nexium  . HTN (hypertension)   .  Obesity   . OSA (obstructive sleep apnea)    uses C-PAP  . PE (pulmonary embolism)   . Peripheral vascular disease ([place])    blood clot in right leg  . Prolonged QT interval    gene positive  . Seizures ([place])    hasn't had any for 25 years    Patient Active Problem List   Diagnosis Date Noted  . Pulmonary embolus ([place]) 04/04/2016  . Cervical spondylosis with radiculopathy 03/29/2016  . Difficulty hearing 10/05/2014  . 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 Deep vein thrombosis of lower extremity ([place]) 10/05/2014  . Edema extremities 10/05/2014  . Esophagitis, reflux 10/05/2014  . Degenerative arthritis of hip 10/05/2014  . Depression, major, single episode, in partial remission ([place]) 10/05/2014  . Pulmonary infarction ([place]) 10/05/2014  . Idiopathic insomnia 10/05/2014  . Cervical stenosis of spinal canal 06/25/2013  . Postural dizziness 06/22/2012  . Essential hypertension 06/22/2012  . Morbid obesity ([place]) 08/17/2010  . UNSPECIFIED ENDOCRINE DISORDER 05/15/2009  . CHEST PAIN UNSPECIFIED 01/08/2009  . DEPRESSION 08/21/2008  . OSTEOPOROSIS 08/21/2008  . LONG QT SYNDROME 08/21/2008    Past Surgical History:  Procedure Laterality Date  . ANTERIOR CERVICAL DECOMP/DISCECTOMY FUSION N/A 06/25/2013   Procedure: ANTERIOR CERVICAL DECOMPRESSION/DISCECTOMY FUSION 2 LEVELS LEVELS C FOUR/FIVE, SIX/SEVEN;  Surgeon: Floyce Stakes, MD;  Location: [facility];  Service: Neurosurgery;  Laterality: N/A;  . ANTERIOR CERVICAL DECOMP/DISCECTOMY FUSION N/A 03/29/2016   Procedure: CERVICAL THREE-FOUR, CERVICAL FOUR-FIVE ANTERIOR CERVICAL DECOMPRESSION/DISCECTOMY/FUSION  WITH REMOVAL OF PLATES AT D34-534 D34-534;  Surgeon: Leeroy Cha, MD;  Location: [facility];  Service: Neurosurgery;  Laterality: N/A;  C3-4 C5-6 ANTERIOR CERVICAL DECOMPRESSION/DISCECTOMY/FUSION WITH POSSIBLE REMOVAL OF PLATE AT D34-534 D34-534  . CARDIAC CATHETERIZATION    . COLONOSCOPY    . DILATION AND CURETTAGE OF UTERUS    .  ESOPHAGOGASTRODUODENOSCOPY (EGD) WITH PROPOFOL N/A 11/17/2015   Procedure: ESOPHAGOGASTRODUODENOSCOPY (EGD) WITH PROPOFOL;  Surgeon: Lollie Sails, MD;  Location: [facility] ENDOSCOPY;  Service: Endoscopy;  Laterality: N/A;  . left foot surgery    . left hand surgery x 2    . LUMBAR DISC SURGERY    . right hand surgery for torn ligament    . TOTAL HIP ARTHROPLASTY      OB History    No data available       Home Medications    Prior to Admission medications   Medication Sig Start Date End Date Taking? Authorizing Provider  albuterol (PROVENTIL HFA;VENTOLIN HFA) 108 (90 BASE) MCG/ACT inhaler Inhale 1-2 puffs into the lungs every 4 (four) hours as needed for wheezing or shortness of breath.   Yes Historical Provider, MD  aspirin 81 MG tablet Take 81 mg by mouth daily.     Yes Historical Provider, MD  cyclobenzaprine (FLEXERIL) 10 MG tablet Take 5 mg by mouth 3 (three) times daily as needed for muscle spasms.   Yes Historical Provider, MD  escitalopram (LEXAPRO) 20 MG tablet Take 20 mg by mouth daily.  10/03/13  Yes Historical Provider, MD  esomeprazole (NEXIUM) 40 MG capsule Take 1 capsule (40 mg total) by mouth at bedtime. [id number]  Yes Delora Fuel, MD  Ferrous Sulfate (SLOW RELEASE IRON PO) Take 1 tablet by mouth daily.    Yes Historical Provider, MD  fluocinonide (LIDEX) 0.05 % external solution Apply 1 application topically 2 (two) times daily. 12/30/11  Yes Historical Provider, MD  furosemide (LASIX) 20 MG tablet TAKE (1) TABLET BY MOUTH EVERY DAY 10/31/14  Yes Glean Hess, MD  Glucosamine-Chondroit-Vit C-Mn (GLUCOSAMINE 1500 COMPLEX) CAPS Take 1 each by mouth daily.   Yes Historical Provider, MD  hyoscyamine (LEVSIN SL) 0.125 MG SL tablet Take 1 tablet by mouth 2 (two) times daily as needed. For nausea 04/03/14  Yes Historical Provider, MD  labetalol (NORMODYNE) 200 MG tablet Take 1 tablet (200 mg total) by mouth 2 (two) times daily. Please keep 04/20/16 appointment for further refills  01/20/16  Yes Deboraha Sprang, MD  loratadine (CLARITIN) 10 MG tablet Take 10 mg by mouth daily.   Yes Historical Provider, MD  Multiple Vitamins-Minerals (MULTIVITAMIN WITH MINERALS) tablet Take 1 tablet by mouth daily.   Yes Historical Provider, MD  nabumetone (RELAFEN) 750 MG tablet Take 1 tablet (750 mg total) by mouth 2 (two) times daily. 02/23/16  Yes Glean Hess, MD  Omega-3 Fatty Acids (FISH OIL) 1000 MG CAPS Take 1 capsule by mouth 3 (three) times daily.     Yes Historical Provider, MD  oxyCODONE-acetaminophen (PERCOCET) 10-325 MG tablet Take 1 tablet by mouth every 4 (four) hours as needed for pain.   Yes Historical Provider, MD  potassium chloride SA (K-DUR,KLOR-CON) 20 MEQ tablet Take 1 tablet (20 mEq total) by mouth daily. 04/08/14  Yes Thayer Headings, MD  predniSONE (STERAPRED UNI-PAK 21 TAB) 5 MG (21) TBPK tablet Take 5 mg by mouth as directed. 6 day   Yes Historical Provider, MD  spironolactone (ALDACTONE) 25 MG tablet Take 1 tablet (25 mg total)  by mouth daily. 05/08/14  Yes Deboraha Sprang, MD  triamterene-hydrochlorothiazide (MAXZIDE) 75-50 MG per tablet Take 1 tablet by mouth daily. 04/07/14  Yes Historical Provider, MD  zolpidem (AMBIEN) 10 MG tablet Take 10 mg by mouth at bedtime as needed for sleep.   Yes Historical Provider, MD  budesonide-formoterol (SYMBICORT) 160-4.5 MCG/ACT inhaler Inhale 2 puffs into the lungs 2 (two) times daily as needed (for shortness of breath).    Historical Provider, MD    Family History Family History  Problem Relation Age of Onset  . Long QT syndrome Mother   . CAD Mother   . Diabetes Father   . CAD Father   . Diabetes Brother   . Breast cancer Maternal Aunt 70  . Breast cancer Paternal Aunt   . Breast cancer Maternal Aunt 33    Social History Social History  Substance Use Topics  . Smoking status: Former Smoker    Quit date: 08/16/2001  . Smokeless tobacco: Never Used  . Alcohol use 1.2 oz/week    2 Standard drinks or equivalent  per week     Comment: very rare     Allergies   Patient has no known allergies.   Review of Systems Review of Systems  Constitutional: Positive for diaphoresis. Negative for chills and fever.  HENT: Negative for trouble swallowing.   Eyes: Negative for visual disturbance.  Respiratory: Positive for shortness of breath.   Cardiovascular: Positive for leg swelling. Negative for chest pain.  Gastrointestinal: Negative for abdominal pain, diarrhea, nausea and vomiting.  Genitourinary: Negative for dysuria and hematuria.  Musculoskeletal: Positive for neck pain. Negative for arthralgias and myalgias.  Skin: Positive for wound. Negative for rash.  Neurological: Positive for syncope and light-headedness. Negative for weakness and numbness.     Physical Exam Updated Vital Signs BP 123/59   Pulse 82   Temp 98.2 F (36.8 C) (Oral)   Resp 23   Ht 5\' 4"  (1.626 m)   Wt 129.3 kg   LMP 03/28/2016   SpO2 99%   BMI 48.92 kg/m   Physical Exam  Constitutional: She appears well-developed and well-nourished. No distress.  HENT:  Head: Normocephalic and atraumatic. Head is without raccoon's eyes and without Battle's sign.  Mouth/Throat: Oropharynx is clear and moist. No oropharyngeal exudate.  Eyes: Conjunctivae and EOM are normal. Pupils are equal, round, and reactive to light. Right eye exhibits no discharge. Left eye exhibits no discharge. No scleral icterus.  Neck: Phonation normal.  Well appearing surgical scars to anterior neck.   Cardiovascular: Normal rate, regular rhythm, normal heart sounds and intact distal pulses.   No murmur heard. Pulmonary/Chest: Effort normal and breath sounds normal. No stridor. No respiratory distress. She has no wheezes. She has no rales.  Abdominal: Soft. Bowel sounds are normal. She exhibits no distension. There is no tenderness. There is no rigidity, no rebound, no guarding and no CVA tenderness.  Musculoskeletal:  No appreciable swelling to lower  extremities. TTP of left posterior calf.   Lymphadenopathy:    She has no cervical adenopathy.  Neurological: She is alert. She has normal strength. She is not disoriented. No sensory deficit. She exhibits normal muscle tone. Coordination normal. GCS eye subscore is 4. GCS verbal subscore is 5. GCS motor subscore is 6.  Patient moves all extremities with ease. Strength and sensation intact and symmetric in all extremities.   Skin: Skin is warm and dry. She is not diaphoretic.  Psychiatric: She has a normal mood and  affect. Her behavior is normal.     ED Treatments / Results  Labs (all labs ordered are listed, but only abnormal results are displayed) Labs Reviewed  BASIC METABOLIC PANEL - Abnormal; Notable for the following:       Result Value   Chloride 98 (*)    Glucose, Bld 117 (*)    BUN 22 (*)    All other components within normal limits  CBC - Abnormal; Notable for the following:    WBC 13.1 (*)    All other components within normal limits  CBG MONITORING, ED - Abnormal; Notable for the following:    Glucose-Capillary 102 (*)    All other components within normal limits  URINALYSIS, ROUTINE W REFLEX MICROSCOPIC (NOT AT [facility])  BRAIN NATRIURETIC PEPTIDE  PROTIME-INR  APTT  CBC  I-STAT BETA HCG BLOOD, ED ([facility], [facility], [facility] ONLY)    EKG  EKG Interpretation None       Radiology Ct Head Wo Contrast  Result Date: 04/04/2016 CLINICAL DATA:  Syncope, head struck ground. Neck pain with recent cervical surgery. 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 EXAM: CT HEAD WITHOUT CONTRAST CT CERVICAL SPINE WITHOUT CONTRAST TECHNIQUE: Multidetector CT imaging of the head and cervical spine was performed following the standard protocol without intravenous contrast. Multiplanar CT image reconstructions of the cervical spine were also generated. COMPARISON:  CT cervical spine 12/01/2015 FINDINGS: CT HEAD FINDINGS Brain: No evidence of acute infarction, hemorrhage, hydrocephalus, extra-axial  collection or mass lesion/mass effect. Vascular: No hyperdense vessel or unexpected calcification. Skull: Normal. Negative for fracture or focal lesion. Sinuses/Orbits: Well-defined low-density within the left maxillary sinus may be mucous retention cysts or polyp. No acute inflammation or evidence of fracture. Other: None. CT CERVICAL SPINE FINDINGS Alignment: Mild straightening of normal lordosis, no jumped or perched facets. Skull base and vertebrae: No skullbase fracture. Anterior fusion C3-C4 with interbody spacer C4 screw extends into superior C5 vertebral body. Anterior fusion C5-C6 with interbody spacer. The C5-C6 interbody spacer is offset to the right, without encroachment on the vertebral canal. Ghost tracks at C4, C5, C6 and C7 from prior hardware. No acute fracture. Soft tissues and spinal canal: No evidence of spinal canal hematoma or prevertebral hematoma. Soft tissue thickening of the prevertebral soft tissue is likely sequela of recent surgery. Disc levels: Postsurgical change as described. Prior C4-C5 and C6-C7 fusion with removal of hardware. Upper chest: No acute abnormality. Other: None. IMPRESSION: 1.  No acute intracranial abnormality. 2. Postsurgical change of the cervical spine with ADCF C3-C4 and C5-C6. Interbody spacer at C5-C6 is offset to the right, without herniation or spinal canal compromise. No evidence of acute postsurgical complication. No cervical spine fracture. 3. Incidental mucous retention cyst or polyp in the left maxillary sinus. Electronically Signed   By: Jeb Levering M.D.   On: 04/04/2016 20:19   Ct Angio Chest Pe W/cm &/or Wo Cm  Result Date: 04/04/2016 CLINICAL DATA:  50 year old female with shortness of breath for several days. EXAM: CT ANGIOGRAPHY CHEST WITH CONTRAST TECHNIQUE: Multidetector CT imaging of the chest was performed using the standard protocol during bolus administration of intravenous contrast. Multiplanar CT image reconstructions and MIPs were  obtained to evaluate the vascular anatomy. CONTRAST:  100 cc intravenous Isovue 370 COMPARISON:  03/16/2016 and prior radiographs. 12/17/2015 and prior CTs FINDINGS: Cardiovascular: Bilateral pulmonary emboli are identified involving the majority of the segmental arteries. Cardiomegaly is present. The RV-LV ratio is 0.75. There is no evidence of thoracic aortic aneurysm  or definite dissection. No pericardial effusion is present. Mediastinum/Nodes: No enlarged mediastinal, hilar, or axillary lymph nodes. Thyroid gland, trachea, and esophagus demonstrate no significant findings. Lungs/Pleura: Lungs are clear. No pleural effusion or pneumothorax. Upper Abdomen: No acute abnormality. Musculoskeletal: No acute or suspicious abnormality. Review of the MIP images confirms the above findings. IMPRESSION: Bilateral pulmonary emboli.  No CT evidence of right heart strain. Cardiomegaly. Critical Value/emergent results were called by telephone at the time of interpretation on 04/04/2016 at 9:52 pm to [facility] , who verbally acknowledged these results. Electronically Signed   By: Margarette Canada M.D.   On: 04/04/2016 21:54   Ct Cervical Spine Wo Contrast  Result Date: 04/04/2016 CLINICAL DATA:  Syncope, head struck ground. Neck pain with recent cervical surgery. 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 EXAM: CT HEAD WITHOUT CONTRAST CT CERVICAL SPINE WITHOUT CONTRAST TECHNIQUE: Multidetector CT imaging of the head and cervical spine was performed following the standard protocol without intravenous contrast. Multiplanar CT image reconstructions of the cervical spine were also generated. COMPARISON:  CT cervical spine 12/01/2015 FINDINGS: CT HEAD FINDINGS Brain: No evidence of acute infarction, hemorrhage, hydrocephalus, extra-axial collection or mass lesion/mass effect. Vascular: No hyperdense vessel or unexpected calcification. Skull: Normal. Negative for fracture or focal lesion. Sinuses/Orbits: Well-defined low-density  within the left maxillary sinus may be mucous retention cysts or polyp. No acute inflammation or evidence of fracture. Other: None. CT CERVICAL SPINE FINDINGS Alignment: Mild straightening of normal lordosis, no jumped or perched facets. Skull base and vertebrae: No skullbase fracture. Anterior fusion C3-C4 with interbody spacer C4 screw extends into superior C5 vertebral body. Anterior fusion C5-C6 with interbody spacer. The C5-C6 interbody spacer is offset to the right, without encroachment on the vertebral canal. Ghost tracks at C4, C5, C6 and C7 from prior hardware. No acute fracture. Soft tissues and spinal canal: No evidence of spinal canal hematoma or prevertebral hematoma. Soft tissue thickening of the prevertebral soft tissue is likely sequela of recent surgery. Disc levels: Postsurgical change as described. Prior C4-C5 and C6-C7 fusion with removal of hardware. Upper chest: No acute abnormality. Other: None. IMPRESSION: 1.  No acute intracranial abnormality. 2. Postsurgical change of the cervical spine with ADCF C3-C4 and C5-C6. Interbody spacer at C5-C6 is offset to the right, without herniation or spinal canal compromise. No evidence of acute postsurgical complication. No cervical spine fracture. 3. Incidental mucous retention cyst or polyp in the left maxillary sinus. Electronically Signed   By: Jeb Levering M.D.   On: 04/04/2016 20:19    Procedures Procedures (including critical care time)  Medications Ordered in ED Medications  iopamidol (ISOVUE-370) 76 % injection (not administered)  morphine 2 MG/ML injection 2 mg (not administered)  heparin ADULT infusion 100 units/mL (25000 units/275mL sodium chloride 0.45%) (not administered)  heparin bolus via infusion 2,000 Units (not administered)  sodium chloride 0.9 % bolus 500 mL (0 mLs Intravenous Stopped 04/04/16 2156)  sodium chloride 0.9 % injection (1 mL  Given by Other 04/04/16 2104)  iopamidol (ISOVUE-370) 76 % injection 100 mL (100  mLs Intravenous Contrast Given 04/04/16 2114)     Initial Impression / Assessment and Plan / ED Course  I have reviewed the triage vital signs and the nursing notes.  Pertinent labs & imaging results that were available during my care of the patient were reviewed by me and considered in my medical decision making (see chart for details).  Clinical Course as of Apr 04 2244  Mon Apr 04, 2016  2218 Reviewed CT Angio Chest PE W/Cm &/Or Wo Cm [AM]  2218 Reviewed CT Head Wo Contrast [AM]  2218 reviewed CT Cervical Spine Wo Contrast [AM]    Clinical Course User Index [AM] Roxanna Mew, PA-C    Patient presents to ED with complaint of syncopal episode today. She felt lightheaded and had shaky legs prior to event. She recently underwent spinal fusion surgery on 11/28. H/o DVT and PE in past. Not on anti-coagulation therapy. Patient is afebrile and non-toxic appearing in NAD. VSS. Physical exam unremarkable. Heart RRR. Lungs CTABL. Abdomen, soft, non-tender, non-distended. Well healing surgical scars noted to anterior neck. Will check basic labs. Will CT head and neck given unwitnessed syncopal event and waking up on ground to r/o intracranial bleed or disruption of recent spinal surgery. Given h/o PE/DVT and recent surgery as well as increased shortness of breath will CTA to r/o PE as etiology.   BMP grossly unremarkable. U/A negative for UTI. BNP nml. CBC shows slightly elevated leukocytosis may be secondary to recent surgery. EKG shows sinus rhythm, short PR interval and non-specific t abnormalities, no evidence of right heart strain. CT head negative for intracranial hemorrhage, acute infarction, or mass lesion. CT neck shows no acute post-surgical complications. CTA remarkable for b/l PE without evidence of right heart strain. Patient discussed with Dr. Ralene Bathe, who also evaluated patient. Will consult neurosurgery regarding anti-coagulation.   10:20 PM: Spoke with Dr. Arnoldo Morale of neurosurgery,  greatly appreciate his time and input. Agrees with anti-coagulation. Have hospitalist consult Dr. Kristeen Mans in AM for evaluation while in hospital. Heparin per pharmacy consult placed. Consult to hospitalist placed.   10:41 PM: Spoke with Dr. Tamala Julian of [facility], greatly appreciate his time and input. Agree to admit patient for further management of b/l PE. Thank you for your continued care of this patient.   Final Clinical Impressions(s) / ED Diagnoses   Final diagnoses:  Other acute pulmonary embolism without acute cor pulmonale (HCC)  Syncope, unspecified syncope type    New Prescriptions New Prescriptions   No medications on file     Roxanna Mew, PA-C 04/04/16 2245    Quintella Reichert, MD 04/05/16 [phone]

## 2016-04-05 DIAGNOSIS — Z7982 Long term (current) use of aspirin: Secondary | ICD-10-CM | POA: Diagnosis not present

## 2016-04-05 DIAGNOSIS — Z86711 Personal history of pulmonary embolism: Secondary | ICD-10-CM | POA: Diagnosis not present

## 2016-04-05 DIAGNOSIS — I2699 Other pulmonary embolism without acute cor pulmonale: Secondary | ICD-10-CM | POA: Diagnosis not present

## 2016-04-05 DIAGNOSIS — E876 Hypokalemia: Secondary | ICD-10-CM | POA: Diagnosis not present

## 2016-04-05 DIAGNOSIS — Z86718 Personal history of other venous thrombosis and embolism: Secondary | ICD-10-CM | POA: Diagnosis not present

## 2016-04-05 DIAGNOSIS — I739 Peripheral vascular disease, unspecified: Secondary | ICD-10-CM | POA: Diagnosis present

## 2016-04-05 DIAGNOSIS — R55 Syncope and collapse: Secondary | ICD-10-CM | POA: Diagnosis present

## 2016-04-05 DIAGNOSIS — G4733 Obstructive sleep apnea (adult) (pediatric): Secondary | ICD-10-CM | POA: Diagnosis not present

## 2016-04-05 DIAGNOSIS — Z6841 Body Mass Index (BMI) 40.0 and over, adult: Secondary | ICD-10-CM | POA: Diagnosis not present

## 2016-04-05 DIAGNOSIS — Z79899 Other long term (current) drug therapy: Secondary | ICD-10-CM | POA: Diagnosis not present

## 2016-04-05 DIAGNOSIS — I4581 Long QT syndrome: Secondary | ICD-10-CM | POA: Diagnosis present

## 2016-04-05 DIAGNOSIS — Z9989 Dependence on other enabling machines and devices: Secondary | ICD-10-CM | POA: Diagnosis not present

## 2016-04-05 DIAGNOSIS — D649 Anemia, unspecified: Secondary | ICD-10-CM | POA: Diagnosis present

## 2016-04-05 DIAGNOSIS — Z981 Arthrodesis status: Secondary | ICD-10-CM | POA: Diagnosis not present

## 2016-04-05 DIAGNOSIS — Z79891 Long term (current) use of opiate analgesic: Secondary | ICD-10-CM | POA: Diagnosis not present

## 2016-04-05 DIAGNOSIS — F419 Anxiety disorder, unspecified: Secondary | ICD-10-CM | POA: Diagnosis present

## 2016-04-05 DIAGNOSIS — I10 Essential (primary) hypertension: Secondary | ICD-10-CM | POA: Diagnosis not present

## 2016-04-05 DIAGNOSIS — M1990 Unspecified osteoarthritis, unspecified site: Secondary | ICD-10-CM | POA: Diagnosis present

## 2016-04-05 DIAGNOSIS — K219 Gastro-esophageal reflux disease without esophagitis: Secondary | ICD-10-CM | POA: Diagnosis present

## 2016-04-05 DIAGNOSIS — F5101 Primary insomnia: Secondary | ICD-10-CM | POA: Diagnosis not present

## 2016-04-05 DIAGNOSIS — Z7951 Long term (current) use of inhaled steroids: Secondary | ICD-10-CM | POA: Diagnosis not present

## 2016-04-05 DIAGNOSIS — Z96649 Presence of unspecified artificial hip joint: Secondary | ICD-10-CM | POA: Diagnosis present

## 2016-04-05 DIAGNOSIS — Z8249 Family history of ischemic heart disease and other diseases of the circulatory system: Secondary | ICD-10-CM | POA: Diagnosis not present

## 2016-04-05 LAB — BASIC METABOLIC PANEL
Anion gap: 9 (ref 5–15)
BUN: 20 mg/dL (ref 6–20)
CO2: 27 mmol/L (ref 22–32)
CREATININE: 0.92 mg/dL (ref 0.44–1.00)
Calcium: 8.7 mg/dL — ABNORMAL LOW (ref 8.9–10.3)
Chloride: 99 mmol/L — ABNORMAL LOW (ref 101–111)
GFR calc Af Amer: >60 mL/min (ref >60)
GLUCOSE: 122 mg/dL — AB (ref 65–99)
POTASSIUM: 3.4 mmol/L — AB (ref 3.5–5.1)
Sodium: 135 mmol/L (ref 135–145)

## 2016-04-05 LAB — CBC
HEMATOCRIT: 35.7 % — AB (ref 36.0–46.0)
HEMOGLOBIN: 11.5 g/dL — AB (ref 12.0–15.0)
MCH: 28.6 pg (ref 26.0–34.0)
MCHC: 32.2 g/dL (ref 30.0–36.0)
MCV: 88.8 fL (ref 78.0–100.0)
Platelets: 278 10*3/uL (ref 150–400)
RBC: 4.02 MIL/uL (ref 3.87–5.11)
RDW: 13.9 % (ref 11.5–15.5)
WBC: 11.2 10*3/uL — ABNORMAL HIGH (ref 4.0–10.5)

## 2016-04-05 LAB — APTT: aPTT: 64 seconds — ABNORMAL HIGH (ref 24–36)

## 2016-04-05 LAB — TROPONIN I

## 2016-04-05 LAB — HEPARIN LEVEL (UNFRACTIONATED)
HEPARIN UNFRACTIONATED: 0.49 [IU]/mL (ref 0.30–0.70)
Heparin Unfractionated: 0.57 IU/mL (ref 0.30–0.70)
Heparin Unfractionated: 0.72 IU/mL — ABNORMAL HIGH (ref 0.30–0.70)

## 2016-04-05 MED ORDER — POTASSIUM CHLORIDE CRYS ER 20 MEQ PO TBCR
20.0000 meq | EXTENDED_RELEASE_TABLET | Freq: Every day | ORAL | Status: DC
Start: 2016-04-05 — End: 2016-04-07
  Administered 2016-04-05 – 2016-04-07 (×3): 20 meq via ORAL
  Filled 2016-04-05 (×3): qty 1

## 2016-04-05 MED ORDER — OXYCODONE-ACETAMINOPHEN 5-325 MG PO TABS
1.0000 | ORAL_TABLET | ORAL | Status: DC | PRN
  Administered 2016-04-05 – 2016-04-07 (×6): 1 via ORAL
  Filled 2016-04-05 (×6): qty 1

## 2016-04-05 MED ORDER — MUSCLE RUB 10-15 % EX CREA
TOPICAL_CREAM | CUTANEOUS | Status: DC | PRN
  Filled 2016-04-05: qty 85

## 2016-04-05 MED ORDER — OXYCODONE HCL 5 MG PO TABS
5.0000 mg | ORAL_TABLET | ORAL | Status: DC | PRN
  Administered 2016-04-05 (×2): 5 mg via ORAL
  Filled 2016-04-05 (×2): qty 1

## 2016-04-05 MED ORDER — ALUM & MAG HYDROXIDE-SIMETH 200-200-20 MG/5ML PO SUSP
15.0000 mL | ORAL | Status: DC | PRN
  Administered 2016-04-05: 15 mL via ORAL
  Filled 2016-04-05: qty 30

## 2016-04-05 MED ORDER — HEPARIN (PORCINE) IN NACL 100-0.45 UNIT/ML-% IJ SOLN
1200.0000 [IU]/h | INTRAMUSCULAR | Status: AC
  Administered 2016-04-06: 1200 [IU]/h via INTRAVENOUS
  Filled 2016-04-05: qty 250

## 2016-04-05 NOTE — Procedures (Signed)
Placed patient on CPAP for the night.  Patient is tolerating well at this time. 

## 2016-04-05 NOTE — Progress Notes (Signed)
ANTICOAGULATION CONSULT NOTE - f/u Consult  Pharmacy Consult for heparin Indication: pulmonary embolus  No Known Allergies  Patient Measurements: Height: 5\' 4"  (162.6 cm) Weight: 285 lb (129.3 kg) IBW/kg (Calculated) : 54.7 Heparin Dosing Weight: 87kg  Vital Signs: Temp: 98.1 F (36.7 C) (12/05 0010) Temp Source: Oral (12/05 0010) BP: 120/52 (12/05 0010) Pulse Rate: 77 (12/05 0010)  Labs:  Recent Labs  04/04/16 1738 04/04/16 2235 04/05/16 0010 04/05/16 0428  HGB 12.8  --   --  11.5*  HCT 39.0  --   --  35.7*  PLT 297  --   --  278  APTT  --  25  --   --   LABPROT  --  13.7  --   --   INR  --  1.05  --   --   HEPARINUNFRC  --   --   --  0.57  CREATININE 0.95  --   --  0.92  TROPONINI  --   --  <0.03  --     Estimated Creatinine Clearance: 97.6 mL/min (by C-G formula based on SCr of 0.92 mg/dL).   Medical History: Past Medical History:  Diagnosis Date  . Anemia   . Anxiety   . Arthritis    osteoarthritis  . Atypical chest pain    negative Myoview in 2011 with no ischemi and normal LV function.   . Depression   . DVT (deep venous thrombosis) ([place])   . GERD (gastroesophageal reflux disease)    uses Nexium  . HTN (hypertension)   . Obesity   . OSA (obstructive sleep apnea)    uses C-PAP  . PE (pulmonary embolism)   . Peripheral vascular disease ([place])    blood clot in right leg  . Prolonged QT interval    gene positive  . Seizures ([place])    hasn't had any for 25 years    Assessment: 57 YOF s/o syncopal episode at [employer].  She has h/o DVT and PE - not on anticoagulation.  She recently had spinal fusion on 11/28.  CTA reveals bilateral PE  12/4  Baseline INR and aPTT pending  CBC Hgb and Pltc WNL Today, 12/5  0428 HL=0.57 , no infusion issues or bleeding per RN  Goal of Therapy:  Heparin level 0.3-0.7 units/ml Monitor platelets by anticoagulation protocol: Yes   Plan:   Continue  Heparin drip at 1400 units/hr  Recheck HL at  1pm  Daily heparin level and CBC  Monitor for bleeding  Await final decision re: long-term anticoagulation   Dorrene German 04/05/2016 5:12 AM

## 2016-04-05 NOTE — Progress Notes (Signed)
ANTICOAGULATION CONSULT NOTE  Pharmacy Consult for heparin Indication: pulmonary embolus  No Known Allergies  Patient Measurements: Height: 5\' 4"  (162.6 cm) Weight: 285 lb (129.3 kg) IBW/kg (Calculated) : 54.7 Heparin Dosing Weight: 87kg  Vital Signs: Temp: 98.2 F (36.8 C) (12/05 0537) Temp Source: Axillary (12/05 0537) BP: 118/61 (12/05 0537) Pulse Rate: 71 (12/05 0537)  Labs:  Recent Labs  04/04/16 1738 04/04/16 2235 04/05/16 0010 04/05/16 0428  HGB 12.8  --   --  11.5*  HCT 39.0  --   --  35.7*  PLT 297  --   --  278  APTT  --  25  --  64*  LABPROT  --  13.7  --   --   INR  --  1.05  --   --   HEPARINUNFRC  --   --   --  0.57  CREATININE 0.95  --   --  0.92  TROPONINI  --   --  <0.03  --     Estimated Creatinine Clearance: 97.6 mL/min (by C-G formula based on SCr of 0.92 mg/dL).  Assessment: 21 YOF s/o syncopal episode at [employer].  She has h/o DVT and PE - not on anticoagulation.  She recently had spinal fusion on 11/28.  CTA reveals bilateral PE  Today, 04/05/2016:  Baseline INR and aPTT pending  CBC Hgb and Pltc WNL  Confirmatory heparin level slightly elevated on 1400 units/hr  Goal of Therapy:  Heparin level 0.3-0.7 units/ml Monitor platelets by anticoagulation protocol: Yes   Plan:   Reduce heparin to 1200 units/hr  Recheck heparin level in 6 hrs  Daily heparin level and CBC  Monitor for bleeding  Await final decision re: long-term anticoagulation  Reuel Boom, PharmD, BCPS Pager: [phone] 04/05/2016, 2:52 PM

## 2016-04-05 NOTE — Progress Notes (Signed)
Pt has Medicare and Medicaid can use Eliquis, Xarelto or Lovenox.

## 2016-04-05 NOTE — Plan of Care (Signed)
Problem: Pain Managment: Goal: General experience of comfort will improve Outcome: Progressing Continue with pain management.

## 2016-04-05 NOTE — Progress Notes (Addendum)
PROGRESS NOTE                                                                                                                                                                                                             Patient Demographics:    [name], is a 50 y.o. female, DOB - [birth year], [id number]  Admit date - 04/04/2016   Admitting Physician Norval Morton, MD  Outpatient Primary MD for the patient is [name], NP  LOS - 0  Outpatient Specialists:Dr. Joya Salm  Chief Complaint  Patient presents with  . Neck Pain  . Loss of Consciousness       Brief Narrative   50 year old female with prior history of DVT/ PE, OSA on CPAP, GERD, recent cervical spinal fusion who was brought to the ED after a syncopal episode. Patient found to have acute PE and admitted to hospitalist service.   Subjective:   Complains of pain in her neck (unchanged). Stable on monitor overnight.   Assessment  & Plan :    Principal Problem:   Acute bilateral Pulmonary embolus (HCC) Likely cause for her syncope. Risk factor for PE likely recent surgery on 11/28. Continue telemetry monitoring. IV heparin. Check Doppler lower extremity for DVT. Check 2-D echo to rule out right heart strain. -Patient given options for anticoagulation.. She is inclined towards Lovenox vs NOAC but wants to review price options. -Needs anticoagulation for at least 6 months.   Active Problems:  Shortness of breath Continue albuterol nebs when necessary. Dulera twice a day.  Status post cervical spinal fusion Continue steroid taper, when necessary oxycodone and Flexeril when necessary for muscle spasms. Consult Dr. Joya Salm if needed. PT evaluation. CT cervical spine done on admission given acute syncope showed post surgical changes without acute injury.  Hypokalemia Replenished    OSA on CPAP  GERD Continue PPI     1.    Code Status :  Full code  Family Communication  : None at bedside  Disposition Plan  : Home pending workup and stable for the next 84 hours on telemetry  Barriers For Discharge : Improving symptoms, pending 2-D echo and lower extremity Doppler  Consults  :  None  Procedures  : CT angiogram of the chest  DVT Prophylaxis  :  IV heparin  Lab Results  Component Value  Date   PLT 278 04/05/2016    Antibiotics  :    Anti-infectives    None        Objective:   Vitals:   04/05/16 0537 04/05/16 1000 04/05/16 1032 04/05/16 1323  BP: 118/61  117/67 125/69  Pulse: 71  73 76  Resp: 18   18  Temp: 98.2 F (36.8 C)   97.9 F (36.6 C)  TempSrc: Axillary   Oral  SpO2: 94% 93%  97%  Weight:      Height:        Wt Readings from Last 3 Encounters:  04/05/16 129.3 kg (285 lb)  03/29/16 131.5 kg (290 lb)  03/14/16 131.5 kg (290 lb)     Intake/Output Summary (Last 24 hours) at 04/05/16 1414 Last data filed at 04/05/16 0600  Gross per 24 hour  Intake           459.47 ml  Output                0 ml  Net           459.47 ml     Physical Exam  Gen: not in distress HEENT:Has cervical collar, no pallor, moist mucosa, supple neck Chest: clear b/l, no added sounds CVS: N S1&S2, no murmurs, rubs or gallop GI: soft, NT, ND,  Musculoskeletal: warm, no edema CNS: Alert and oriented, non focal    Data Review:    CBC  Recent Labs Lab 04/04/16 1738 04/05/16 0428  WBC 13.1* 11.2*  HGB 12.8 11.5*  HCT 39.0 35.7*  PLT 297 278  MCV 88.6 88.8  MCH 29.1 28.6  MCHC 32.8 32.2  RDW 13.6 13.9    Chemistries   Recent Labs Lab 04/04/16 1738 04/05/16 0428  NA 136 135  K 4.2 3.4*  CL 98* 99*  CO2 29 27  GLUCOSE 117* 122*  BUN 22* 20  CREATININE 0.95 0.92  CALCIUM 9.6 8.7*   ------------------------------------------------------------------------------------------------------------------ No results for input(s): CHOL, HDL, LDLCALC, TRIG, CHOLHDL, LDLDIRECT in the last 72  hours.  No results found for: HGBA1C ------------------------------------------------------------------------------------------------------------------ No results for input(s): TSH, T4TOTAL, T3FREE, THYROIDAB in the last 72 hours.  Invalid input(s): FREET3 ------------------------------------------------------------------------------------------------------------------ No results for input(s): VITAMINB12, FOLATE, FERRITIN, TIBC, IRON, RETICCTPCT in the last 72 hours.  Coagulation profile  Recent Labs Lab 04/04/16 2235  INR 1.05    No results for input(s): DDIMER in the last 72 hours.  Cardiac Enzymes  Recent Labs Lab 04/05/16 0010  TROPONINI <0.03   ------------------------------------------------------------------------------------------------------------------    Component Value Date/Time   BNP 17.2 04/04/2016 1738    Inpatient Medications  Scheduled Meds: . aspirin  81 mg Oral Daily  . escitalopram  20 mg Oral Daily  . ferrous sulfate  325 mg Oral Daily  . labetalol  200 mg Oral BID  . loratadine  10 mg Oral Daily  . mometasone-formoterol  2 puff Inhalation BID  . multivitamin with minerals  1 tablet Oral Daily  . pantoprazole  40 mg Oral Daily  . potassium chloride SA  20 mEq Oral Daily  . spironolactone  25 mg Oral Daily  . triamterene-hydrochlorothiazide  1 tablet Oral Daily   Continuous Infusions: . heparin 1,400 Units/hr (04/05/16 1319)   PRN Meds:.acetaminophen **OR** acetaminophen, albuterol, alum & mag hydroxide-simeth, cyclobenzaprine, hyoscyamine, ondansetron **OR** ondansetron (ZOFRAN) IV, oxyCODONE-acetaminophen **AND** oxyCODONE, zolpidem  Micro Results Recent Results (from the past 240 hour(s))  Surgical pcr screen     Status: None  Collection Time: 03/29/16  8:03 AM  Result Value Ref Range Status   MRSA, PCR NEGATIVE NEGATIVE Final   Staphylococcus aureus NEGATIVE NEGATIVE Final    Comment:        The Xpert SA Assay (FDA approved for  NASAL specimens in patients over 50 years of age), is one component of a comprehensive surveillance program.  Test performance has been validated by [facility] for patients greater than or equal to 59 year old. It is not intended to diagnose infection nor to guide or monitor treatment.     Radiology Reports Dg Chest 2 View  Result Date: 03/16/2016 CLINICAL DATA:  Short of breath EXAM: CHEST  2 VIEW COMPARISON:  11/17/2015 FINDINGS: The heart size and mediastinal contours are within normal limits. Both lungs are clear. The visualized skeletal structures are unremarkable. IMPRESSION: No active cardiopulmonary disease. Electronically Signed   By: Franchot Gallo M.D.   On: 03/16/2016 13:46   Dg Cervical Spine 2-3 Views  Result Date: 03/29/2016 CLINICAL DATA:  C3-4 and C4-5 anterior fusion EXAM: CERVICAL SPINE - 2-3 VIEW COMPARISON:  CT C-spine of 12/01/2015 FINDINGS: The mid to lower cervical vertebrae are not well seen due to overlapping shoulders obscuring detail. A needle is present anteriorly which appears to be directed toward the C3-4 interspace. A second portable cross-table lateral view shows anterior fusion at C3-4 and C5-6, with the lower cervical spine again obscured by overlapping shoulders. IMPRESSION: Anterior fusion plate at 075-GRM and 075-GRM levels, with the lower cervical spine obscured by overlying soft tissue. Electronically Signed   By: Ivar Drape M.D.   On: 03/29/2016 11:42   Ct Head Wo Contrast  Result Date: 04/04/2016 CLINICAL DATA:  Syncope, head struck ground. Neck pain with recent cervical surgery. 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 EXAM: CT HEAD WITHOUT CONTRAST CT CERVICAL SPINE WITHOUT CONTRAST TECHNIQUE: Multidetector CT imaging of the head and cervical spine was performed following the standard protocol without intravenous contrast. Multiplanar CT image reconstructions of the cervical spine were also generated. COMPARISON:  CT cervical spine 12/01/2015  FINDINGS: CT HEAD FINDINGS Brain: No evidence of acute infarction, hemorrhage, hydrocephalus, extra-axial collection or mass lesion/mass effect. Vascular: No hyperdense vessel or unexpected calcification. Skull: Normal. Negative for fracture or focal lesion. Sinuses/Orbits: Well-defined low-density within the left maxillary sinus may be mucous retention cysts or polyp. No acute inflammation or evidence of fracture. Other: None. CT CERVICAL SPINE FINDINGS Alignment: Mild straightening of normal lordosis, no jumped or perched facets. Skull base and vertebrae: No skullbase fracture. Anterior fusion C3-C4 with interbody spacer C4 screw extends into superior C5 vertebral body. Anterior fusion C5-C6 with interbody spacer. The C5-C6 interbody spacer is offset to the right, without encroachment on the vertebral canal. Ghost tracks at C4, C5, C6 and C7 from prior hardware. No acute fracture. Soft tissues and spinal canal: No evidence of spinal canal hematoma or prevertebral hematoma. Soft tissue thickening of the prevertebral soft tissue is likely sequela of recent surgery. Disc levels: Postsurgical change as described. Prior C4-C5 and C6-C7 fusion with removal of hardware. Upper chest: No acute abnormality. Other: None. IMPRESSION: 1.  No acute intracranial abnormality. 2. Postsurgical change of the cervical spine with ADCF C3-C4 and C5-C6. Interbody spacer at C5-C6 is offset to the right, without herniation or spinal canal compromise. No evidence of acute postsurgical complication. No cervical spine fracture. 3. Incidental mucous retention cyst or polyp in the left maxillary sinus. Electronically Signed   By: Fonnie Birkenhead.D.  On: 04/04/2016 20:19   Ct Angio Chest Pe W/cm &/or Wo Cm  Result Date: 04/04/2016 CLINICAL DATA:  50 year old female with shortness of breath for several days. EXAM: CT ANGIOGRAPHY CHEST WITH CONTRAST TECHNIQUE: Multidetector CT imaging of the chest was performed using the standard protocol  during bolus administration of intravenous contrast. Multiplanar CT image reconstructions and MIPs were obtained to evaluate the vascular anatomy. CONTRAST:  100 cc intravenous Isovue 370 COMPARISON:  03/16/2016 and prior radiographs. 12/17/2015 and prior CTs FINDINGS: Cardiovascular: Bilateral pulmonary emboli are identified involving the majority of the segmental arteries. Cardiomegaly is present. The RV-LV ratio is 0.75. There is no evidence of thoracic aortic aneurysm or definite dissection. No pericardial effusion is present. Mediastinum/Nodes: No enlarged mediastinal, hilar, or axillary lymph nodes. Thyroid gland, trachea, and esophagus demonstrate no significant findings. Lungs/Pleura: Lungs are clear. No pleural effusion or pneumothorax. Upper Abdomen: No acute abnormality. Musculoskeletal: No acute or suspicious abnormality. Review of the MIP images confirms the above findings. IMPRESSION: Bilateral pulmonary emboli.  No CT evidence of right heart strain. Cardiomegaly. Critical Value/emergent results were called by telephone at the time of interpretation on 04/04/2016 at 9:52 pm to [facility] , who verbally acknowledged these results. Electronically Signed   By: Margarette Canada M.D.   On: 04/04/2016 21:54   Ct Cervical Spine Wo Contrast  Result Date: 04/04/2016 CLINICAL DATA:  Syncope, head struck ground. Neck pain with recent cervical surgery. 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 EXAM: CT HEAD WITHOUT CONTRAST CT CERVICAL SPINE WITHOUT CONTRAST TECHNIQUE: Multidetector CT imaging of the head and cervical spine was performed following the standard protocol without intravenous contrast. Multiplanar CT image reconstructions of the cervical spine were also generated. COMPARISON:  CT cervical spine 12/01/2015 FINDINGS: CT HEAD FINDINGS Brain: No evidence of acute infarction, hemorrhage, hydrocephalus, extra-axial collection or mass lesion/mass effect. Vascular: No hyperdense vessel or unexpected  calcification. Skull: Normal. Negative for fracture or focal lesion. Sinuses/Orbits: Well-defined low-density within the left maxillary sinus may be mucous retention cysts or polyp. No acute inflammation or evidence of fracture. Other: None. CT CERVICAL SPINE FINDINGS Alignment: Mild straightening of normal lordosis, no jumped or perched facets. Skull base and vertebrae: No skullbase fracture. Anterior fusion C3-C4 with interbody spacer C4 screw extends into superior C5 vertebral body. Anterior fusion C5-C6 with interbody spacer. The C5-C6 interbody spacer is offset to the right, without encroachment on the vertebral canal. Ghost tracks at C4, C5, C6 and C7 from prior hardware. No acute fracture. Soft tissues and spinal canal: No evidence of spinal canal hematoma or prevertebral hematoma. Soft tissue thickening of the prevertebral soft tissue is likely sequela of recent surgery. Disc levels: Postsurgical change as described. Prior C4-C5 and C6-C7 fusion with removal of hardware. Upper chest: No acute abnormality. Other: None. IMPRESSION: 1.  No acute intracranial abnormality. 2. Postsurgical change of the cervical spine with ADCF C3-C4 and C5-C6. Interbody spacer at C5-C6 is offset to the right, without herniation or spinal canal compromise. No evidence of acute postsurgical complication. No cervical spine fracture. 3. Incidental mucous retention cyst or polyp in the left maxillary sinus. Electronically Signed   By: Jeb Levering M.D.   On: 04/04/2016 20:19    Time Spent in minutes  25   Louellen Molder M.D on 04/05/2016 at 2:14 PM  Between 7am to 7pm - Pager - [phone]  After 7pm go to [email or web address] - password [facility]  [facility] -  Office  [phone]

## 2016-04-05 NOTE — Progress Notes (Signed)
Patient ID: [name], female   DOB: 21-Sep-1965, 50 y.o.   MRN: [id number] Came to see her.stable, no SOB. Wound dry. No headache. She is to call me to see me in 1 - 2 weeks

## 2016-04-05 NOTE — Progress Notes (Signed)
ANTICOAGULATION CONSULT NOTE  Pharmacy Consult for heparin Indication: pulmonary embolus  No Known Allergies  Patient Measurements: Height: 5\' 4"  (162.6 cm) Weight: 285 lb (129.3 kg) IBW/kg (Calculated) : 54.7 Heparin Dosing Weight: 87kg  Vital Signs: Temp: 97.9 F (36.6 C) (12/05 1323) Temp Source: Oral (12/05 1323) BP: 125/69 (12/05 1323) Pulse Rate: 79 (12/05 2051)  Labs:  Recent Labs  04/04/16 1738 04/04/16 2235 04/05/16 0010 04/05/16 0428 04/05/16 1307 04/05/16 2044  HGB 12.8  --   --  11.5*  --   --   HCT 39.0  --   --  35.7*  --   --   PLT 297  --   --  278  --   --   APTT  --  25  --  64*  --   --   LABPROT  --  13.7  --   --   --   --   INR  --  1.05  --   --   --   --   HEPARINUNFRC  --   --   --  0.57 0.72* 0.49  CREATININE 0.95  --   --  0.92  --   --   TROPONINI  --   --  <0.03  --   --   --     Estimated Creatinine Clearance: 97.6 mL/min (by C-G formula based on SCr of 0.92 mg/dL).  Assessment: 12 YOF s/o syncopal episode at [employer].  She has h/o DVT and PE - not on anticoagulation.  She recently had spinal fusion on 11/28.  CTA reveals bilateral PE  Today, 04/05/2016:  Baseline INR and aPTT WNL  CBC Hgb and Pltc WNL  PM heparin level therapeutic  Goal of Therapy:  Heparin level 0.3-0.7 units/ml Monitor platelets by anticoagulation protocol: Yes   Plan:   continue heparin at 1200 units/hr  Recheck heparin level with am labs  Daily heparin level and CBC  Monitor for bleeding  Await final decision re: long-term anticoagulation  Dolly Rias RPh 04/05/2016, 9:11 PM Pager [phone]

## 2016-04-06 ENCOUNTER — Inpatient Hospital Stay (HOSPITAL_COMMUNITY): Payer: Medicare Other

## 2016-04-06 DIAGNOSIS — Z86711 Personal history of pulmonary embolism: Secondary | ICD-10-CM

## 2016-04-06 DIAGNOSIS — I2699 Other pulmonary embolism without acute cor pulmonale: Secondary | ICD-10-CM

## 2016-04-06 LAB — HEPARIN LEVEL (UNFRACTIONATED): Heparin Unfractionated: 0.5 IU/mL (ref 0.30–0.70)

## 2016-04-06 LAB — CBC
HEMATOCRIT: 34.8 % — AB (ref 36.0–46.0)
Hemoglobin: 11.1 g/dL — ABNORMAL LOW (ref 12.0–15.0)
MCH: 28.2 pg (ref 26.0–34.0)
MCHC: 31.9 g/dL (ref 30.0–36.0)
MCV: 88.5 fL (ref 78.0–100.0)
PLATELETS: 278 10*3/uL (ref 150–400)
RBC: 3.93 MIL/uL (ref 3.87–5.11)
RDW: 13.7 % (ref 11.5–15.5)
WBC: 10.5 10*3/uL (ref 4.0–10.5)

## 2016-04-06 LAB — ECHOCARDIOGRAM COMPLETE
Height: 64 in
Weight: 4560 oz

## 2016-04-06 MED ORDER — ENOXAPARIN (LOVENOX) PATIENT EDUCATION KIT
PACK | Freq: Once | Status: AC
  Administered 2016-04-06: 20:00:00
  Filled 2016-04-06: qty 1

## 2016-04-06 MED ORDER — APIXABAN 5 MG PO TABS
10.0000 mg | ORAL_TABLET | Freq: Two times a day (BID) | ORAL | Status: DC
  Administered 2016-04-06: 10 mg via ORAL
  Filled 2016-04-06: qty 2

## 2016-04-06 MED ORDER — ENOXAPARIN SODIUM 150 MG/ML ~~LOC~~ SOLN
1.0000 mg/kg | Freq: Two times a day (BID) | SUBCUTANEOUS | Status: DC
  Administered 2016-04-07 (×2): 130 mg via SUBCUTANEOUS
  Filled 2016-04-06 (×2): qty 0.86

## 2016-04-06 MED ORDER — APIXABAN 5 MG PO TABS: 5.0000 mg | ORAL_TABLET | Freq: Two times a day (BID) | ORAL | Status: DC

## 2016-04-06 NOTE — Progress Notes (Signed)
*  PRELIMINARY RESULTS* Vascular Ultrasound Lower extremity venous duplex has been completed.  Preliminary findings: No evidence of DVT or baker's cyst.  Landry Mellow, RDMS, RVT  04/06/2016, 10:35 AM

## 2016-04-06 NOTE — Progress Notes (Signed)
  Echocardiogram 2D Echocardiogram has been performed.  Darlina Sicilian M 04/06/2016, 3:51 PM

## 2016-04-06 NOTE — Progress Notes (Addendum)
ANTICOAGULATION CONSULT NOTE  Pharmacy Consult for heparin --> apixaban --> lovenox Indication: pulmonary embolus  No Known Allergies  Patient Measurements: Height: 5\' 4"  (162.6 cm) Weight: 285 lb (129.3 kg) IBW/kg (Calculated) : 54.7 Heparin Dosing Weight: 87kg  Vital Signs: Temp: 97.8 F (36.6 C) (12/06 0537) Temp Source: Oral (12/06 0537) BP: 121/62 (12/06 0537) Pulse Rate: 74 (12/06 0537)  Labs:  Recent Labs  04/04/16 1738 04/04/16 2235 04/05/16 0010  04/05/16 0428 04/05/16 1307 04/05/16 2044 04/06/16 0342  HGB 12.8  --   --   --  11.5*  --   --  11.1*  HCT 39.0  --   --   --  35.7*  --   --  34.8*  PLT 297  --   --   --  278  --   --  278  APTT  --  25  --   --  64*  --   --   --   LABPROT  --  13.7  --   --   --   --   --   --   INR  --  1.05  --   --   --   --   --   --   HEPARINUNFRC  --   --   --   < > 0.57 0.72* 0.49 0.50  CREATININE 0.95  --   --   --  0.92  --   --   --   TROPONINI  --   --  <0.03  --   --   --   --   --   < > = values in this interval not displayed.  Estimated Creatinine Clearance: 97.6 mL/min (by C-G formula based on SCr of 0.92 mg/dL).  Assessment: 61 YOF who presented to [facility] ED after syncopal episode at post  office.  She has h/o DVT and PE - not on anticoagulation PTA.  She recently had spinal fusion on 11/28.  CTa reveals bilateral PE  Today, 04/06/2016:  AM heparin level = 0.5, therapeutic on heparin infusion at 1200 units/hr  CBC: Hgb slightly low, but stable. Pltc WNL  No bleeding or infusion issues per nursing  Goal of Therapy:  Heparin level 0.3-0.7 units/ml Monitor platelets by anticoagulation protocol: Yes   Plan:   Continue heparin infusion at 1200 units/hr  Daily heparin level and CBC while on heparin infusion  Monitor for s/sx of bleeding  Await final decision re: long-term anticoagulation   Lindell Spar, PharmD, BCPS Pager: [phone] 04/06/2016 7:33 AM    Addendum: Asked to transition patient from  IV heparin to Apixaban.  Lower extremity venous duplex negative for DVT. SCr 0.92 (04/05/16) Weight = 129.3 kg   Plan:  -D/C IV heparin infusion at 1200.  -Start Apixaban at 1200 at dose of 10 mg PO BID x 7 days, then 5mg  PO BID. -Monitor closely for s/sx of bleeding. -Pharmacy to provide education prior to discharge.    Lindell Spar, PharmD, BCPS Pager: [phone] 04/06/2016 11:35 AM    Addendum: Upon discussion with patient, she became very nauseous after taking the Apixaban. Discussed with MD-asked to transition patient to Enoxaparin.   Dose of Apixaban 10mg  PO was given at 1211 today CrCl ~ 98 ml/min   Plan: Start lovenox 1 mg/kg (130mg ) SQ q12h 12/7 at 0000 (~ 12 hours after Apixaban dose). D/C aspirin while on full dose anticoagulation per MD. Monitor renal function, CBC. Monitor closely for s/sx of bleeding.  Lindell Spar, PharmD, BCPS Pager: [phone] 04/06/2016 5:05 PM

## 2016-04-06 NOTE — Progress Notes (Signed)
PROGRESS NOTE    TEMPE RAZZAK  [id number] DOB: 19-Mar-1966 DOA: 04/04/2016 PCP: [name], NP    Brief Narrative:  50 year old female with prior history of DVT/ PE, OSA on CPAP, GERD, recent cervical spinal fusion who was brought to the ED after a syncopal episode. Patient found to have acute PE and admitted to hospitalist service.  Assessment & Plan:   Principal Problem:   Pulmonary embolus (HCC) Active Problems:   Essential hypertension   Idiopathic insomnia   OSA on CPAP   Status post cervical spinal fusion   Principal Problem:   Acute bilateral Pulmonary embolus (HCC) -Suspect is cause for her presenting syncope.  -Risk factor for PE likely recent surgery on 11/28.  -Of note, patient also had LLE DVT in 2015. Chart reviewed -Patient also reports brother being diagnosed with DVT in his 52's -Will possibly require long-term anticoagulation and evaluation by Hematologist for ?heriditary hypercoagulable state -LE dopplers neg for DVT -2d echo done, pending results -Patient has elected to continue with therapeutic lovenox on discharge -Needs anticoagulation for at least 6 months, possibly longer -discussed case with Dr. Joya Salm, who states patient can take therapeutic anticoagulation  Active Problems: Shortness of breath Continue albuterol nebs when necessary. Dulera twice a day. -Stable at present. Denies sob  Status post cervical spinal fusion Continue steroid taper, when necessary oxycodone and Flexeril when necessary for muscle spasms.. CT cervical spine done on admission given acute syncope showed post surgical changes without acute injury. -discussed case with Dr. Joya Salm who states he will follow up with patient in 10 days  Hypokalemia Replaced Repeat BMET in AM    OSA on CPAP Stable at this time  GERD Continue PPI -Stable this AM  DVT prophylaxis: Lovenox Code Status: Full Family Communication: Pt in room, family not at  bedside Disposition Plan: Likely home 12/7  Consultants:     Procedures:     Antimicrobials: Anti-infectives    None       Subjective: Eager to go home  Objective: Vitals:   04/05/16 2318 04/06/16 0537 04/06/16 0913 04/06/16 1404  BP:  121/62  116/61  Pulse: 84 74  75  Resp: 18 18  17   Temp:  97.8 F (36.6 C)  98 F (36.7 C)  TempSrc:  Oral  Oral  SpO2: 95% 100% 97% 99%  Weight:      Height:        Intake/Output Summary (Last 24 hours) at 04/06/16 1819 Last data filed at 04/06/16 0600  Gross per 24 hour  Intake            299.6 ml  Output                0 ml  Net            299.6 ml   Filed Weights   04/04/16 1704 04/05/16 0010  Weight: 129.3 kg (285 lb) 129.3 kg (285 lb)    Examination:  General exam: Appears calm and comfortable  Respiratory system: Clear to auscultation. Respiratory effort normal. Cardiovascular system: S1 & S2 heard, RRR Gastrointestinal system: Abdomen is nondistended, soft and nontender. No organomegaly or masses felt. Normal bowel sounds heard. Central nervous system: Alert and oriented. No focal neurological deficits. Extremities: Symmetric 5 x 5 power. Skin: No rashes, lesions Psychiatry: Judgement and insight appear normal. Mood & affect appropriate.   Data Reviewed: I have personally reviewed following labs and imaging studies  CBC:  Recent Labs Lab 04/04/16 1738  04/05/16 0428 04/06/16 0342  WBC 13.1* 11.2* 10.5  HGB 12.8 11.5* 11.1*  HCT 39.0 35.7* 34.8*  MCV 88.6 88.8 88.5  PLT 297 278 0000000   Basic Metabolic Panel:  Recent Labs Lab 04/04/16 1738 04/05/16 0428  NA 136 135  K 4.2 3.4*  CL 98* 99*  CO2 29 27  GLUCOSE 117* 122*  BUN 22* 20  CREATININE 0.95 0.92  CALCIUM 9.6 8.7*   GFR: Estimated Creatinine Clearance: 97.6 mL/min (by C-G formula based on SCr of 0.92 mg/dL). Liver Function Tests: No results for input(s): AST, ALT, ALKPHOS, BILITOT, PROT, ALBUMIN in the last 168 hours. No results for  input(s): LIPASE, AMYLASE in the last 168 hours. No results for input(s): AMMONIA in the last 168 hours. Coagulation Profile:  Recent Labs Lab 04/04/16 2235  INR 1.05   Cardiac Enzymes:  Recent Labs Lab 04/05/16 0010  TROPONINI <0.03   BNP (last 3 results) No results for input(s): PROBNP in the last 8760 hours. HbA1C: No results for input(s): HGBA1C in the last 72 hours. CBG:  Recent Labs Lab 04/04/16 1753  GLUCAP 102*   Lipid Profile: No results for input(s): CHOL, HDL, LDLCALC, TRIG, CHOLHDL, LDLDIRECT in the last 72 hours. Thyroid Function Tests: No results for input(s): TSH, T4TOTAL, FREET4, T3FREE, THYROIDAB in the last 72 hours. Anemia Panel: No results for input(s): VITAMINB12, FOLATE, FERRITIN, TIBC, IRON, RETICCTPCT in the last 72 hours. Sepsis Labs: No results for input(s): PROCALCITON, LATICACIDVEN in the last 168 hours.  Recent Results (from the past 240 hour(s))  Surgical pcr screen     Status: None   Collection Time: 03/29/16  8:03 AM  Result Value Ref Range Status   MRSA, PCR NEGATIVE NEGATIVE Final   Staphylococcus aureus NEGATIVE NEGATIVE Final    Comment:        The Xpert SA Assay (FDA approved for NASAL specimens in patients over 64 years of age), is one component of a comprehensive surveillance program.  Test performance has been validated by [facility] for patients greater than or equal to 47 year old. It is not intended to diagnose infection nor to guide or monitor treatment.      Radiology Studies: Ct Head Wo Contrast  Result Date: 04/04/2016 CLINICAL DATA:  Syncope, head struck ground. Neck pain with recent cervical surgery. 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 EXAM: CT HEAD WITHOUT CONTRAST CT CERVICAL SPINE WITHOUT CONTRAST TECHNIQUE: Multidetector CT imaging of the head and cervical spine was performed following the standard protocol without intravenous contrast. Multiplanar CT image reconstructions of the cervical spine  were also generated. COMPARISON:  CT cervical spine 12/01/2015 FINDINGS: CT HEAD FINDINGS Brain: No evidence of acute infarction, hemorrhage, hydrocephalus, extra-axial collection or mass lesion/mass effect. Vascular: No hyperdense vessel or unexpected calcification. Skull: Normal. Negative for fracture or focal lesion. Sinuses/Orbits: Well-defined low-density within the left maxillary sinus may be mucous retention cysts or polyp. No acute inflammation or evidence of fracture. Other: None. CT CERVICAL SPINE FINDINGS Alignment: Mild straightening of normal lordosis, no jumped or perched facets. Skull base and vertebrae: No skullbase fracture. Anterior fusion C3-C4 with interbody spacer C4 screw extends into superior C5 vertebral body. Anterior fusion C5-C6 with interbody spacer. The C5-C6 interbody spacer is offset to the right, without encroachment on the vertebral canal. Ghost tracks at C4, C5, C6 and C7 from prior hardware. No acute fracture. Soft tissues and spinal canal: No evidence of spinal canal hematoma or prevertebral hematoma. Soft tissue thickening of the  prevertebral soft tissue is likely sequela of recent surgery. Disc levels: Postsurgical change as described. Prior C4-C5 and C6-C7 fusion with removal of hardware. Upper chest: No acute abnormality. Other: None. IMPRESSION: 1.  No acute intracranial abnormality. 2. Postsurgical change of the cervical spine with ADCF C3-C4 and C5-C6. Interbody spacer at C5-C6 is offset to the right, without herniation or spinal canal compromise. No evidence of acute postsurgical complication. No cervical spine fracture. 3. Incidental mucous retention cyst or polyp in the left maxillary sinus. Electronically Signed   By: Jeb Levering M.D.   On: 04/04/2016 20:19   Ct Angio Chest Pe W/cm &/or Wo Cm  Result Date: 04/04/2016 CLINICAL DATA:  50 year old female with shortness of breath for several days. EXAM: CT ANGIOGRAPHY CHEST WITH CONTRAST TECHNIQUE: Multidetector CT  imaging of the chest was performed using the standard protocol during bolus administration of intravenous contrast. Multiplanar CT image reconstructions and MIPs were obtained to evaluate the vascular anatomy. CONTRAST:  100 cc intravenous Isovue 370 COMPARISON:  03/16/2016 and prior radiographs. 12/17/2015 and prior CTs FINDINGS: Cardiovascular: Bilateral pulmonary emboli are identified involving the majority of the segmental arteries. Cardiomegaly is present. The RV-LV ratio is 0.75. There is no evidence of thoracic aortic aneurysm or definite dissection. No pericardial effusion is present. Mediastinum/Nodes: No enlarged mediastinal, hilar, or axillary lymph nodes. Thyroid gland, trachea, and esophagus demonstrate no significant findings. Lungs/Pleura: Lungs are clear. No pleural effusion or pneumothorax. Upper Abdomen: No acute abnormality. Musculoskeletal: No acute or suspicious abnormality. Review of the MIP images confirms the above findings. IMPRESSION: Bilateral pulmonary emboli.  No CT evidence of right heart strain. Cardiomegaly. Critical Value/emergent results were called by telephone at the time of interpretation on 04/04/2016 at 9:52 pm to [facility] , who verbally acknowledged these results. Electronically Signed   By: Margarette Canada M.D.   On: 04/04/2016 21:54   Ct Cervical Spine Wo Contrast  Result Date: 04/04/2016 CLINICAL DATA:  Syncope, head struck ground. Neck pain with recent cervical surgery. 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 EXAM: CT HEAD WITHOUT CONTRAST CT CERVICAL SPINE WITHOUT CONTRAST TECHNIQUE: Multidetector CT imaging of the head and cervical spine was performed following the standard protocol without intravenous contrast. Multiplanar CT image reconstructions of the cervical spine were also generated. COMPARISON:  CT cervical spine 12/01/2015 FINDINGS: CT HEAD FINDINGS Brain: No evidence of acute infarction, hemorrhage, hydrocephalus, extra-axial collection or mass  lesion/mass effect. Vascular: No hyperdense vessel or unexpected calcification. Skull: Normal. Negative for fracture or focal lesion. Sinuses/Orbits: Well-defined low-density within the left maxillary sinus may be mucous retention cysts or polyp. No acute inflammation or evidence of fracture. Other: None. CT CERVICAL SPINE FINDINGS Alignment: Mild straightening of normal lordosis, no jumped or perched facets. Skull base and vertebrae: No skullbase fracture. Anterior fusion C3-C4 with interbody spacer C4 screw extends into superior C5 vertebral body. Anterior fusion C5-C6 with interbody spacer. The C5-C6 interbody spacer is offset to the right, without encroachment on the vertebral canal. Ghost tracks at C4, C5, C6 and C7 from prior hardware. No acute fracture. Soft tissues and spinal canal: No evidence of spinal canal hematoma or prevertebral hematoma. Soft tissue thickening of the prevertebral soft tissue is likely sequela of recent surgery. Disc levels: Postsurgical change as described. Prior C4-C5 and C6-C7 fusion with removal of hardware. Upper chest: No acute abnormality. Other: None. IMPRESSION: 1.  No acute intracranial abnormality. 2. Postsurgical change of the cervical spine with ADCF C3-C4 and C5-C6. Interbody spacer at C5-C6  is offset to the right, without herniation or spinal canal compromise. No evidence of acute postsurgical complication. No cervical spine fracture. 3. Incidental mucous retention cyst or polyp in the left maxillary sinus. Electronically Signed   By: Jeb Levering M.D.   On: 04/04/2016 20:19    Scheduled Meds: . [START ON 04/07/2016] enoxaparin (LOVENOX) injection  1 mg/kg Subcutaneous Q12H  . enoxaparin   Does not apply Once  . escitalopram  20 mg Oral Daily  . ferrous sulfate  325 mg Oral Daily  . labetalol  200 mg Oral BID  . loratadine  10 mg Oral Daily  . mometasone-formoterol  2 puff Inhalation BID  . multivitamin with minerals  1 tablet Oral Daily  . pantoprazole   40 mg Oral Daily  . potassium chloride SA  20 mEq Oral Daily  . spironolactone  25 mg Oral Daily  . triamterene-hydrochlorothiazide  1 tablet Oral Daily   Continuous Infusions:   LOS: 1 day   CHIU, Orpah Melter, MD [facility] Pager [phone]  If 7PM-7AM, please contact night-coverage [email or web address] Password [facility] 04/06/2016, 6:19 PM

## 2016-04-07 ENCOUNTER — Other Ambulatory Visit (HOSPITAL_COMMUNITY): Payer: Medicare Other

## 2016-04-07 LAB — CBC
HCT: 37.5 % (ref 36.0–46.0)
HEMOGLOBIN: 12 g/dL (ref 12.0–15.0)
MCH: 28.3 pg (ref 26.0–34.0)
MCHC: 32 g/dL (ref 30.0–36.0)
MCV: 88.4 fL (ref 78.0–100.0)
Platelets: 317 10*3/uL (ref 150–400)
RBC: 4.24 MIL/uL (ref 3.87–5.11)
RDW: 13.7 % (ref 11.5–15.5)
WBC: 10.1 10*3/uL (ref 4.0–10.5)

## 2016-04-07 LAB — BASIC METABOLIC PANEL
Anion gap: 7 (ref 5–15)
BUN: 19 mg/dL (ref 6–20)
CHLORIDE: 99 mmol/L — AB (ref 101–111)
CO2: 31 mmol/L (ref 22–32)
CREATININE: 0.99 mg/dL (ref 0.44–1.00)
Calcium: 9.7 mg/dL (ref 8.9–10.3)
GFR calc Af Amer: >60 mL/min (ref >60)
GFR calc non Af Amer: >60 mL/min (ref >60)
GLUCOSE: 113 mg/dL — AB (ref 65–99)
POTASSIUM: 4.3 mmol/L (ref 3.5–5.1)
SODIUM: 137 mmol/L (ref 135–145)

## 2016-04-07 MED ORDER — ENOXAPARIN SODIUM 150 MG/ML ~~LOC~~ SOLN: 1.0000 mg/kg | Freq: Two times a day (BID) | SUBCUTANEOUS | Status: DC

## 2016-04-07 NOTE — Discharge Summary (Signed)
Physician Discharge Summary  [name] [id number] DOB: 09/20/1965 DOA: 04/04/2016  PCP: Sonia Pali, NP  Admit date: 04/04/2016 Discharge date: 04/07/2016  Admitted From: Home Disposition:  Home  Recommendations for Outpatient Follow-up:  1. Follow up with PCP in 2-3 weeks 2. Consider outpatient referral to Hematologist secondary to recurrent thromboembolism 3. Will likely require long term anticoagulation past 6 months 4. Follow up with Dr. Joya Salm in 10 days  Discharge Condition:Stable CODE STATUS:Full Diet recommendation: Heart healthy   Brief/Interim Summary: 50 year old female with prior history of DVT/ PE, OSA on CPAP, GERD, recent cervical spinal fusion who was brought to the ED after a syncopal episode. Patient found to have acute PE and admitted to hospitalist service.  Principal Problem: Acute bilateral Pulmonary embolus ([place]) -Suspect is cause for her presenting syncope.  -Risk factor for PE likely recent surgery on 11/28.  -Of note, patient also had LLE DVT in 2015. Chart reviewed -Patient also reports brother being diagnosed with DVT in his 22's -Will possibly require long-term anticoagulation and evaluation by Hematologist for ?heriditary hypercoagulable state -LE dopplers neg for DVT -2d echo done, unremarkable -Patient has elected to continue with therapeutic lovenox on discharge -Needs anticoagulation for at least 6 months, possibly longer -discussed case with Dr. Joya Salm, who states patient can take therapeutic anticoagulation  Active Problems: Shortness of breath Continue albuterol nebs when necessary. Dulera twice a day. -Stable at present. Denies sob  Status post cervical spinal fusion Continue steroid taper, when necessary oxycodone and Flexeril when necessary for muscle spasms.. CT cervical spine done on admission given acute syncope showed post surgical changes without acute injury. -discussed case with Dr. Joya Salm who states he will  follow up with patient in 10 days  Hypokalemia Replaced Repeat BMET in AM  OSA on CPAP Stable at this time  GERD Continue PPI -Stable this AM  Discharge Diagnoses:  Principal Problem:   Pulmonary embolus ([place]) Active Problems:   Essential hypertension   Idiopathic insomnia   OSA on CPAP   Status post cervical spinal fusion    Discharge Instructions     Medication List    STOP taking these medications   aspirin 81 MG tablet   predniSONE 5 MG (21) Tbpk tablet Commonly known as:  STERAPRED UNI-PAK 21 TAB     TAKE these medications   albuterol 108 (90 Base) MCG/ACT inhaler Commonly known as:  PROVENTIL HFA;VENTOLIN HFA Inhale 1-2 puffs into the lungs every 4 (four) hours as needed for wheezing or shortness of breath.   budesonide-formoterol 160-4.5 MCG/ACT inhaler Commonly known as:  SYMBICORT Inhale 2 puffs into the lungs 2 (two) times daily as needed (for shortness of breath).   cyclobenzaprine 10 MG tablet Commonly known as:  FLEXERIL Take 5 mg by mouth 3 (three) times daily as needed for muscle spasms.   enoxaparin 150 MG/ML injection Commonly known as:  LOVENOX Inject 0.86 mLs (130 mg total) into the skin every 12 (twelve) hours. Please dispense one month supply. Zero refills. Additional refills per primary physician   escitalopram 20 MG tablet Commonly known as:  LEXAPRO Take 20 mg by mouth daily.   esomeprazole 40 MG capsule Commonly known as:  NEXIUM Take 1 capsule (40 mg total) by mouth at bedtime.   Fish Oil 1000 MG Caps Take 1 capsule by mouth 3 (three) times daily.   fluocinonide 0.05 % external solution Commonly known as:  LIDEX Apply 1 application topically 2 (two) times daily.   furosemide 20 MG  tablet Commonly known as:  LASIX TAKE (1) TABLET BY MOUTH EVERY DAY   GLUCOSAMINE 1500 COMPLEX Caps Take 1 each by mouth daily.   hyoscyamine 0.125 MG SL tablet Commonly known as:  LEVSIN SL Take 1 tablet by mouth 2 (two) times  daily as needed. For nausea   labetalol 200 MG tablet Commonly known as:  NORMODYNE Take 1 tablet (200 mg total) by mouth 2 (two) times daily. Please keep 04/20/16 appointment for further refills   loratadine 10 MG tablet Commonly known as:  CLARITIN Take 10 mg by mouth daily.   multivitamin with minerals tablet Take 1 tablet by mouth daily.   nabumetone 750 MG tablet Commonly known as:  RELAFEN Take 1 tablet (750 mg total) by mouth 2 (two) times daily.   oxyCODONE-acetaminophen 10-325 MG tablet Commonly known as:  PERCOCET Take 1 tablet by mouth every 4 (four) hours as needed for pain.   potassium chloride SA 20 MEQ tablet Commonly known as:  K-DUR,KLOR-CON Take 1 tablet (20 mEq total) by mouth daily.   SLOW RELEASE IRON PO Take 1 tablet by mouth daily.   spironolactone 25 MG tablet Commonly known as:  ALDACTONE Take 1 tablet (25 mg total) by mouth daily.   triamterene-hydrochlorothiazide 75-50 MG tablet Commonly known as:  MAXZIDE Take 1 tablet by mouth daily.   zolpidem 10 MG tablet Commonly known as:  AMBIEN Take 10 mg by mouth at bedtime as needed for sleep.      Follow-up Information    Sonia Pali, NP. Schedule an appointment as soon as possible for a visit in 2 week(s).   Specialty:  Nurse Practitioner Contact information: [place] [id number] [phone]          No Known Allergies  Consultations:  Discussed with Dr. Joya Salm  Procedures/Studies: Dg Chest 2 View  Result Date: 03/16/2016 CLINICAL DATA:  Short of breath EXAM: CHEST  2 VIEW COMPARISON:  11/17/2015 FINDINGS: The heart size and mediastinal contours are within normal limits. Both lungs are clear. The visualized skeletal structures are unremarkable. IMPRESSION: No active cardiopulmonary disease. Electronically Signed   By: Franchot Gallo M.D.   On: 03/16/2016 13:46   Dg Cervical Spine 2-3 Views  Result Date: 03/29/2016 CLINICAL DATA:  C3-4 and C4-5 anterior fusion  EXAM: CERVICAL SPINE - 2-3 VIEW COMPARISON:  CT C-spine of 12/01/2015 FINDINGS: The mid to lower cervical vertebrae are not well seen due to overlapping shoulders obscuring detail. A needle is present anteriorly which appears to be directed toward the C3-4 interspace. A second portable cross-table lateral view shows anterior fusion at C3-4 and C5-6, with the lower cervical spine again obscured by overlapping shoulders. IMPRESSION: Anterior fusion plate at 075-GRM and 075-GRM levels, with the lower cervical spine obscured by overlying soft tissue. Electronically Signed   By: Ivar Drape M.D.   On: 03/29/2016 11:42   Ct Head Wo Contrast  Result Date: 04/04/2016 CLINICAL DATA:  Syncope, head struck ground. Neck pain with recent cervical surgery. 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 EXAM: CT HEAD WITHOUT CONTRAST CT CERVICAL SPINE WITHOUT CONTRAST TECHNIQUE: Multidetector CT imaging of the head and cervical spine was performed following the standard protocol without intravenous contrast. Multiplanar CT image reconstructions of the cervical spine were also generated. COMPARISON:  CT cervical spine 12/01/2015 FINDINGS: CT HEAD FINDINGS Brain: No evidence of acute infarction, hemorrhage, hydrocephalus, extra-axial collection or mass lesion/mass effect. Vascular: No hyperdense vessel or unexpected calcification. Skull: Normal. Negative  for fracture or focal lesion. Sinuses/Orbits: Well-defined low-density within the left maxillary sinus may be mucous retention cysts or polyp. No acute inflammation or evidence of fracture. Other: None. CT CERVICAL SPINE FINDINGS Alignment: Mild straightening of normal lordosis, no jumped or perched facets. Skull base and vertebrae: No skullbase fracture. Anterior fusion C3-C4 with interbody spacer C4 screw extends into superior C5 vertebral body. Anterior fusion C5-C6 with interbody spacer. The C5-C6 interbody spacer is offset to the right, without encroachment on the vertebral canal.  Ghost tracks at C4, C5, C6 and C7 from prior hardware. No acute fracture. Soft tissues and spinal canal: No evidence of spinal canal hematoma or prevertebral hematoma. Soft tissue thickening of the prevertebral soft tissue is likely sequela of recent surgery. Disc levels: Postsurgical change as described. Prior C4-C5 and C6-C7 fusion with removal of hardware. Upper chest: No acute abnormality. Other: None. IMPRESSION: 1.  No acute intracranial abnormality. 2. Postsurgical change of the cervical spine with ADCF C3-C4 and C5-C6. Interbody spacer at C5-C6 is offset to the right, without herniation or spinal canal compromise. No evidence of acute postsurgical complication. No cervical spine fracture. 3. Incidental mucous retention cyst or polyp in the left maxillary sinus. Electronically Signed   By: Jeb Levering M.D.   On: 04/04/2016 20:19   Ct Angio Chest Pe W/cm &/or Wo Cm  Result Date: 04/04/2016 CLINICAL DATA:  50 year old female with shortness of breath for several days. EXAM: CT ANGIOGRAPHY CHEST WITH CONTRAST TECHNIQUE: Multidetector CT imaging of the chest was performed using the standard protocol during bolus administration of intravenous contrast. Multiplanar CT image reconstructions and MIPs were obtained to evaluate the vascular anatomy. CONTRAST:  100 cc intravenous Isovue 370 COMPARISON:  03/16/2016 and prior radiographs. 12/17/2015 and prior CTs FINDINGS: Cardiovascular: Bilateral pulmonary emboli are identified involving the majority of the segmental arteries. Cardiomegaly is present. The RV-LV ratio is 0.75. There is no evidence of thoracic aortic aneurysm or definite dissection. No pericardial effusion is present. Mediastinum/Nodes: No enlarged mediastinal, hilar, or axillary lymph nodes. Thyroid gland, trachea, and esophagus demonstrate no significant findings. Lungs/Pleura: Lungs are clear. No pleural effusion or pneumothorax. Upper Abdomen: No acute abnormality. Musculoskeletal: No acute or  suspicious abnormality. Review of the MIP images confirms the above findings. IMPRESSION: Bilateral pulmonary emboli.  No CT evidence of right heart strain. Cardiomegaly. Critical Value/emergent results were called by telephone at the time of interpretation on 04/04/2016 at 9:52 pm to [facility] , who verbally acknowledged these results. Electronically Signed   By: Margarette Canada M.D.   On: 04/04/2016 21:54   Ct Cervical Spine Wo Contrast  Result Date: 04/04/2016 CLINICAL DATA:  Syncope, head struck ground. Neck pain with recent cervical surgery. 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 EXAM: CT HEAD WITHOUT CONTRAST CT CERVICAL SPINE WITHOUT CONTRAST TECHNIQUE: Multidetector CT imaging of the head and cervical spine was performed following the standard protocol without intravenous contrast. Multiplanar CT image reconstructions of the cervical spine were also generated. COMPARISON:  CT cervical spine 12/01/2015 FINDINGS: CT HEAD FINDINGS Brain: No evidence of acute infarction, hemorrhage, hydrocephalus, extra-axial collection or mass lesion/mass effect. Vascular: No hyperdense vessel or unexpected calcification. Skull: Normal. Negative for fracture or focal lesion. Sinuses/Orbits: Well-defined low-density within the left maxillary sinus may be mucous retention cysts or polyp. No acute inflammation or evidence of fracture. Other: None. CT CERVICAL SPINE FINDINGS Alignment: Mild straightening of normal lordosis, no jumped or perched facets. Skull base and vertebrae: No skullbase fracture. Anterior fusion C3-C4  with interbody spacer C4 screw extends into superior C5 vertebral body. Anterior fusion C5-C6 with interbody spacer. The C5-C6 interbody spacer is offset to the right, without encroachment on the vertebral canal. Ghost tracks at C4, C5, C6 and C7 from prior hardware. No acute fracture. Soft tissues and spinal canal: No evidence of spinal canal hematoma or prevertebral hematoma. Soft tissue thickening of the  prevertebral soft tissue is likely sequela of recent surgery. Disc levels: Postsurgical change as described. Prior C4-C5 and C6-C7 fusion with removal of hardware. Upper chest: No acute abnormality. Other: None. IMPRESSION: 1.  No acute intracranial abnormality. 2. Postsurgical change of the cervical spine with ADCF C3-C4 and C5-C6. Interbody spacer at C5-C6 is offset to the right, without herniation or spinal canal compromise. No evidence of acute postsurgical complication. No cervical spine fracture. 3. Incidental mucous retention cyst or polyp in the left maxillary sinus. Electronically Signed   By: Jeb Levering M.D.   On: 04/04/2016 20:19    Subjective: Eager to go home  Discharge Exam: Vitals:   04/07/16 0554 04/07/16 1159  BP: 113/70 133/73  Pulse: 81 83  Resp: 17 18  Temp: 98.3 F (36.8 C) 97.4 F (36.3 C)   Vitals:   04/06/16 2217 04/07/16 0554 04/07/16 0926 04/07/16 1159  BP:  113/70  133/73  Pulse:  81  83  Resp: 18 17  18   Temp:  98.3 F (36.8 C)  97.4 F (36.3 C)  TempSrc:  Oral  Oral  SpO2:  97% 93% 98%  Weight:      Height:        General: Pt is alert, awake, not in acute distress Cardiovascular: RRR, S1/S2 +, no rubs, no gallops Respiratory: CTA bilaterally, no wheezing, no rhonchi Abdominal: Soft, NT, ND, bowel sounds + Extremities: no edema, no cyanosis   The results of significant diagnostics from this hospitalization (including imaging, microbiology, ancillary and laboratory) are listed below for reference.     Microbiology: Recent Results (from the past 240 hour(s))  Surgical pcr screen     Status: None   Collection Time: 03/29/16  8:03 AM  Result Value Ref Range Status   MRSA, PCR NEGATIVE NEGATIVE Final   Staphylococcus aureus NEGATIVE NEGATIVE Final    Comment:        The Xpert SA Assay (FDA approved for NASAL specimens in patients over 69 years of age), is one component of a comprehensive surveillance program.  Test performance  has been validated by [facility] for patients greater than or equal to 85 year old. It is not intended to diagnose infection nor to guide or monitor treatment.      Labs: BNP (last 3 results)  Recent Labs  04/04/16 1738  BNP 123XX123   Basic Metabolic Panel:  Recent Labs Lab 04/04/16 1738 04/05/16 0428 04/07/16 0400  NA 136 135 137  K 4.2 3.4* 4.3  CL 98* 99* 99*  CO2 29 27 31   GLUCOSE 117* 122* 113*  BUN 22* 20 19  CREATININE 0.95 0.92 0.99  CALCIUM 9.6 8.7* 9.7   Liver Function Tests: No results for input(s): AST, ALT, ALKPHOS, BILITOT, PROT, ALBUMIN in the last 168 hours. No results for input(s): LIPASE, AMYLASE in the last 168 hours. No results for input(s): AMMONIA in the last 168 hours. CBC:  Recent Labs Lab 04/04/16 1738 04/05/16 0428 04/06/16 0342 04/07/16 0400  WBC 13.1* 11.2* 10.5 10.1  HGB 12.8 11.5* 11.1* 12.0  HCT 39.0 35.7* 34.8* 37.5  MCV  88.6 88.8 88.5 88.4  PLT 297 278 278 317   Cardiac Enzymes:  Recent Labs Lab 04/05/16 0010  TROPONINI <0.03   BNP: Invalid input(s): POCBNP CBG:  Recent Labs Lab 04/04/16 1753  GLUCAP 102*   D-Dimer No results for input(s): DDIMER in the last 72 hours. Hgb A1c No results for input(s): HGBA1C in the last 72 hours. Lipid Profile No results for input(s): CHOL, HDL, LDLCALC, TRIG, CHOLHDL, LDLDIRECT in the last 72 hours. Thyroid function studies No results for input(s): TSH, T4TOTAL, T3FREE, THYROIDAB in the last 72 hours.  Invalid input(s): FREET3 Anemia work up No results for input(s): VITAMINB12, FOLATE, FERRITIN, TIBC, IRON, RETICCTPCT in the last 72 hours. Urinalysis    Component Value Date/Time   COLORURINE YELLOW 04/04/2016 1813   APPEARANCEUR CLEAR 04/04/2016 1813   APPEARANCEUR Hazy 03/09/2014 1217   LABSPEC 1.020 04/04/2016 1813   LABSPEC 1.030 03/09/2014 1217   PHURINE 6.5 04/04/2016 1813   GLUCOSEU NEGATIVE 04/04/2016 1813   GLUCOSEU Negative 03/09/2014 1217   HGBUR  NEGATIVE 04/04/2016 1813   BILIRUBINUR NEGATIVE 04/04/2016 1813   BILIRUBINUR 1 06/11/2015 1248   BILIRUBINUR Negative 03/09/2014 1217   KETONESUR NEGATIVE 04/04/2016 1813   PROTEINUR NEGATIVE 04/04/2016 1813   UROBILINOGEN 0.2 06/11/2015 1248   NITRITE NEGATIVE 04/04/2016 1813   LEUKOCYTESUR NEGATIVE 04/04/2016 1813   LEUKOCYTESUR Negative 03/09/2014 1217   Sepsis Labs Invalid input(s): PROCALCITONIN,  WBC,  LACTICIDVEN Microbiology Recent Results (from the past 240 hour(s))  Surgical pcr screen     Status: None   Collection Time: 03/29/16  8:03 AM  Result Value Ref Range Status   MRSA, PCR NEGATIVE NEGATIVE Final   Staphylococcus aureus NEGATIVE NEGATIVE Final    Comment:        The Xpert SA Assay (FDA approved for NASAL specimens in patients over 16 years of age), is one component of a comprehensive surveillance program.  Test performance has been validated by [facility] for patients greater than or equal to 34 year old. It is not intended to diagnose infection nor to guide or monitor treatment.      SIGNED:   Donne Hazel, MD  [facility] 04/07/2016, 2:59 PM  If 7PM-7AM, please contact night-coverage [email or web address] Password [email or web address]

## 2016-04-13 DIAGNOSIS — I739 Peripheral vascular disease, unspecified: Secondary | ICD-10-CM | POA: Diagnosis not present

## 2016-04-13 DIAGNOSIS — I2699 Other pulmonary embolism without acute cor pulmonale: Secondary | ICD-10-CM | POA: Diagnosis not present

## 2016-04-13 DIAGNOSIS — M1990 Unspecified osteoarthritis, unspecified site: Secondary | ICD-10-CM | POA: Diagnosis not present

## 2016-04-13 DIAGNOSIS — I10 Essential (primary) hypertension: Secondary | ICD-10-CM | POA: Diagnosis not present

## 2016-04-13 DIAGNOSIS — Z4789 Encounter for other orthopedic aftercare: Secondary | ICD-10-CM | POA: Diagnosis not present

## 2016-04-13 DIAGNOSIS — M4722 Other spondylosis with radiculopathy, cervical region: Secondary | ICD-10-CM | POA: Diagnosis not present

## 2016-04-19 DIAGNOSIS — M4722 Other spondylosis with radiculopathy, cervical region: Secondary | ICD-10-CM | POA: Diagnosis not present

## 2016-04-19 DIAGNOSIS — I739 Peripheral vascular disease, unspecified: Secondary | ICD-10-CM | POA: Diagnosis not present

## 2016-04-19 DIAGNOSIS — K219 Gastro-esophageal reflux disease without esophagitis: Secondary | ICD-10-CM | POA: Diagnosis not present

## 2016-04-19 DIAGNOSIS — M1990 Unspecified osteoarthritis, unspecified site: Secondary | ICD-10-CM | POA: Diagnosis not present

## 2016-04-19 DIAGNOSIS — M542 Cervicalgia: Secondary | ICD-10-CM | POA: Diagnosis not present

## 2016-04-19 DIAGNOSIS — Z4789 Encounter for other orthopedic aftercare: Secondary | ICD-10-CM | POA: Diagnosis not present

## 2016-04-19 DIAGNOSIS — I2699 Other pulmonary embolism without acute cor pulmonale: Secondary | ICD-10-CM | POA: Diagnosis not present

## 2016-04-19 DIAGNOSIS — I10 Essential (primary) hypertension: Secondary | ICD-10-CM | POA: Diagnosis not present

## 2016-04-19 DIAGNOSIS — Z6841 Body Mass Index (BMI) 40.0 and over, adult: Secondary | ICD-10-CM | POA: Diagnosis not present

## 2016-04-20 ENCOUNTER — Ambulatory Visit: Payer: Medicare Other | Admitting: Internal Medicine

## 2016-04-21 DIAGNOSIS — Z4789 Encounter for other orthopedic aftercare: Secondary | ICD-10-CM | POA: Diagnosis not present

## 2016-04-21 DIAGNOSIS — M1990 Unspecified osteoarthritis, unspecified site: Secondary | ICD-10-CM | POA: Diagnosis not present

## 2016-04-21 DIAGNOSIS — I2699 Other pulmonary embolism without acute cor pulmonale: Secondary | ICD-10-CM | POA: Diagnosis not present

## 2016-04-21 DIAGNOSIS — I739 Peripheral vascular disease, unspecified: Secondary | ICD-10-CM | POA: Diagnosis not present

## 2016-04-21 DIAGNOSIS — M4722 Other spondylosis with radiculopathy, cervical region: Secondary | ICD-10-CM | POA: Diagnosis not present

## 2016-04-21 DIAGNOSIS — I10 Essential (primary) hypertension: Secondary | ICD-10-CM | POA: Diagnosis not present

## 2016-04-26 DIAGNOSIS — I10 Essential (primary) hypertension: Secondary | ICD-10-CM | POA: Diagnosis not present

## 2016-04-26 DIAGNOSIS — Z4789 Encounter for other orthopedic aftercare: Secondary | ICD-10-CM | POA: Diagnosis not present

## 2016-04-26 DIAGNOSIS — M1990 Unspecified osteoarthritis, unspecified site: Secondary | ICD-10-CM | POA: Diagnosis not present

## 2016-04-26 DIAGNOSIS — M4722 Other spondylosis with radiculopathy, cervical region: Secondary | ICD-10-CM | POA: Diagnosis not present

## 2016-04-26 DIAGNOSIS — I2699 Other pulmonary embolism without acute cor pulmonale: Secondary | ICD-10-CM | POA: Diagnosis not present

## 2016-04-26 DIAGNOSIS — I739 Peripheral vascular disease, unspecified: Secondary | ICD-10-CM | POA: Diagnosis not present

## 2016-05-10 DIAGNOSIS — M1990 Unspecified osteoarthritis, unspecified site: Secondary | ICD-10-CM | POA: Diagnosis not present

## 2016-05-10 DIAGNOSIS — I739 Peripheral vascular disease, unspecified: Secondary | ICD-10-CM | POA: Diagnosis not present

## 2016-05-10 DIAGNOSIS — Z4789 Encounter for other orthopedic aftercare: Secondary | ICD-10-CM | POA: Diagnosis not present

## 2016-05-10 DIAGNOSIS — M4722 Other spondylosis with radiculopathy, cervical region: Secondary | ICD-10-CM | POA: Diagnosis not present

## 2016-05-10 DIAGNOSIS — I10 Essential (primary) hypertension: Secondary | ICD-10-CM | POA: Diagnosis not present

## 2016-05-10 DIAGNOSIS — I2699 Other pulmonary embolism without acute cor pulmonale: Secondary | ICD-10-CM | POA: Diagnosis not present

## 2016-05-16 ENCOUNTER — Other Ambulatory Visit: Payer: Self-pay | Admitting: *Deleted

## 2016-05-16 MED ORDER — LABETALOL HCL 200 MG PO TABS: 200.0000 mg | ORAL_TABLET | Freq: Two times a day (BID) | ORAL | 0 refills | Status: DC

## 2016-05-17 DIAGNOSIS — M4722 Other spondylosis with radiculopathy, cervical region: Secondary | ICD-10-CM | POA: Diagnosis not present

## 2016-05-17 DIAGNOSIS — Z4789 Encounter for other orthopedic aftercare: Secondary | ICD-10-CM | POA: Diagnosis not present

## 2016-05-17 DIAGNOSIS — M1990 Unspecified osteoarthritis, unspecified site: Secondary | ICD-10-CM | POA: Diagnosis not present

## 2016-05-17 DIAGNOSIS — I10 Essential (primary) hypertension: Secondary | ICD-10-CM | POA: Diagnosis not present

## 2016-05-17 DIAGNOSIS — I739 Peripheral vascular disease, unspecified: Secondary | ICD-10-CM | POA: Diagnosis not present

## 2016-05-17 DIAGNOSIS — I2699 Other pulmonary embolism without acute cor pulmonale: Secondary | ICD-10-CM | POA: Diagnosis not present

## 2016-05-20 DIAGNOSIS — M5412 Radiculopathy, cervical region: Secondary | ICD-10-CM | POA: Diagnosis not present

## 2016-05-20 DIAGNOSIS — Z6841 Body Mass Index (BMI) 40.0 and over, adult: Secondary | ICD-10-CM | POA: Diagnosis not present

## 2016-06-01 DIAGNOSIS — M1990 Unspecified osteoarthritis, unspecified site: Secondary | ICD-10-CM | POA: Diagnosis not present

## 2016-06-01 DIAGNOSIS — I10 Essential (primary) hypertension: Secondary | ICD-10-CM | POA: Diagnosis not present

## 2016-06-01 DIAGNOSIS — I2699 Other pulmonary embolism without acute cor pulmonale: Secondary | ICD-10-CM | POA: Diagnosis not present

## 2016-06-01 DIAGNOSIS — I739 Peripheral vascular disease, unspecified: Secondary | ICD-10-CM | POA: Diagnosis not present

## 2016-06-01 DIAGNOSIS — Z4789 Encounter for other orthopedic aftercare: Secondary | ICD-10-CM | POA: Diagnosis not present

## 2016-06-01 DIAGNOSIS — M4722 Other spondylosis with radiculopathy, cervical region: Secondary | ICD-10-CM | POA: Diagnosis not present

## 2016-06-13 ENCOUNTER — Encounter: Payer: Self-pay | Admitting: Internal Medicine

## 2016-06-17 ENCOUNTER — Ambulatory Visit: Payer: Medicare Other | Admitting: Internal Medicine

## 2016-06-21 ENCOUNTER — Ambulatory Visit: Payer: Medicare Other | Admitting: Internal Medicine

## 2016-06-22 ENCOUNTER — Encounter: Payer: Self-pay | Admitting: Internal Medicine

## 2016-06-24 DIAGNOSIS — I2699 Other pulmonary embolism without acute cor pulmonale: Secondary | ICD-10-CM | POA: Diagnosis not present

## 2016-07-10 NOTE — Progress Notes (Deleted)
Cardiology Office Note Date:  07/10/2016  Patient ID:  [name], [name] Dec 04, 1965, MRN [id number] PCP:  [name], NP  Cardiologist:  Dr. Caryl Comes   Chief Complaint: CP, SOB  History of Present Illness: [name] is a 51 y.o. 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 seizure d/o, she states this is when she was found with long QT, OSA she reports complaint with CPAP, orthostasis with ocassional positional lightheadedness, no near syncope or syncopal events, morbid obesity, chronic neck pain  She last saw Dr. Caryl Comes April 2016, at that time, he mentions hx of atypical CP/SOB with a negative w/u in 2011, he discontinued her Maxide with hx of orthostais and concerns of hypokalemia given long QT, though was doing well.  She was subsequently seen by myself in Nov 2017 with c/o getting SOB easier with exertion.  She denied exertional CP, palpitations, no rest SOB, no symptoms of PND or orthopnea.  She feels SOB when bending to put her shoes on, this also causes discomfort in her upper abdomen, lower chest that she states has been found to be secondary to a sliding hernia.  We did a BMET and CXR that were unrevealing, an echo was ordered but not completed.  She was having escalating neck pain and underwent Cspine surgery late November, early December suffered a syncopal event and found with acute b/l PE, with neg head CT, echo done during that hospital stay with EF 55-60%, no WMA, normal RV systolic function, grade I DD.  She was discharged on lovernox to f/u with her PMD< and hematology evaluation out patient.  *** symptoms *** hematology eval? *** long term a/c *** bleeding *** syncope, orthostatic?  Past Medical History:  Diagnosis Date  . Anemia   . Anxiety   . Arthritis    osteoarthritis  . Atypical chest pain    negative Myoview in 2011 with no ischemi and normal LV function.   . Depression   . DVT (deep venous  thrombosis) ([place])   . GERD (gastroesophageal reflux disease)    uses Nexium  . HTN (hypertension)   . Obesity   . OSA (obstructive sleep apnea)    uses C-PAP  . PE (pulmonary embolism)   . Peripheral vascular disease ([place])    blood clot in right leg  . Prolonged QT interval    gene positive  . Seizures ([place])    hasn't had any for 25 years    Past Surgical History:  Procedure Laterality Date  . ANTERIOR CERVICAL DECOMP/DISCECTOMY FUSION N/A 06/25/2013   Procedure: ANTERIOR CERVICAL DECOMPRESSION/DISCECTOMY FUSION 2 LEVELS LEVELS C FOUR/FIVE, SIX/SEVEN;  Surgeon: Floyce Stakes, MD;  Location: [facility];  Service: Neurosurgery;  Laterality: N/A;  . ANTERIOR CERVICAL DECOMP/DISCECTOMY FUSION N/A 03/29/2016   Procedure: CERVICAL THREE-FOUR, CERVICAL FOUR-FIVE ANTERIOR CERVICAL DECOMPRESSION/DISCECTOMY/FUSION WITH REMOVAL OF PLATES AT D9-8 P3-8;  Surgeon: Leeroy Cha, MD;  Location: [place];  Service: Neurosurgery;  Laterality: N/A;  C3-4 C5-6 ANTERIOR CERVICAL DECOMPRESSION/DISCECTOMY/FUSION WITH POSSIBLE REMOVAL OF PLATE AT S5-0 N3-9  . CARDIAC CATHETERIZATION    . COLONOSCOPY    . DILATION AND CURETTAGE OF UTERUS    . ESOPHAGOGASTRODUODENOSCOPY (EGD) WITH PROPOFOL N/A 11/17/2015   Procedure: ESOPHAGOGASTRODUODENOSCOPY (EGD) WITH PROPOFOL;  Surgeon: Lollie Sails, MD;  Location: [facility] ENDOSCOPY;  Service: Endoscopy;  Laterality: N/A;  . left foot surgery    . left hand surgery x 2    . LUMBAR  [place] SURGERY    . right hand surgery for torn ligament    . TOTAL HIP ARTHROPLASTY      Current Outpatient Prescriptions  Medication Sig Dispense Refill  . albuterol (PROVENTIL HFA;VENTOLIN HFA) 108 (90 BASE) MCG/ACT inhaler Inhale 1-2 puffs into the lungs every 4 (four) hours as needed for wheezing or shortness of breath.    . budesonide-formoterol (SYMBICORT) 160-4.5 MCG/ACT inhaler Inhale 2 puffs into the lungs 2 (two) times daily as needed (for shortness of breath).    .  cyclobenzaprine (FLEXERIL) 10 MG tablet Take 5 mg by mouth 3 (three) times daily as needed for muscle spasms.    Marland Kitchen enoxaparin (LOVENOX) 150 MG/ML injection Inject 0.86 mLs (130 mg total) into the skin every 12 (twelve) hours. Please dispense one month supply. Zero refills. Additional refills per primary physician 0 Syringe   . escitalopram (LEXAPRO) 20 MG tablet Take 20 mg by mouth daily.     Marland Kitchen esomeprazole (NEXIUM) 40 MG capsule Take 1 capsule (40 mg total) by mouth at bedtime. 10 capsule 0  . Ferrous Sulfate (SLOW RELEASE IRON PO) Take 1 tablet by mouth daily.     . fluocinonide (LIDEX) 0.05 % external solution Apply 1 application topically 2 (two) times daily.    . furosemide (LASIX) 20 MG tablet TAKE (1) TABLET BY MOUTH EVERY DAY 30 tablet 5  . Glucosamine-Chondroit-Vit C-Mn (GLUCOSAMINE 1500 COMPLEX) CAPS Take 1 each by mouth daily.    . hyoscyamine (LEVSIN SL) 0.125 MG SL tablet Take 1 tablet by mouth 2 (two) times daily as needed. For nausea    . labetalol (NORMODYNE) 200 MG tablet Take 1 tablet (200 mg total) by mouth 2 (two) times daily. Please keep 04/20/16 appointment for further refills 60 tablet 0  . loratadine (CLARITIN) 10 MG tablet Take 10 mg by mouth daily.    . Multiple Vitamins-Minerals (MULTIVITAMIN WITH MINERALS) tablet Take 1 tablet by mouth daily.    . nabumetone (RELAFEN) 750 MG tablet Take 1 tablet (750 mg total) by mouth 2 (two) times daily. 60 tablet 5  . Omega-3 Fatty Acids (FISH OIL) 1000 MG CAPS Take 1 capsule by mouth 3 (three) times daily.      Marland Kitchen oxyCODONE-acetaminophen (PERCOCET) 10-325 MG tablet Take 1 tablet by mouth every 4 (four) hours as needed for pain.    . potassium chloride SA (K-DUR,KLOR-CON) 20 MEQ tablet Take 1 tablet (20 mEq total) by mouth daily. 30 tablet 5  . spironolactone (ALDACTONE) 25 MG tablet Take 1 tablet (25 mg total) by mouth daily. 30 tablet 5  . triamterene-hydrochlorothiazide (MAXZIDE) 75-50 MG per tablet Take 1 tablet by mouth daily.      Marland Kitchen zolpidem (AMBIEN) 10 MG tablet Take 10 mg by mouth at bedtime as needed for sleep.     No current facility-administered medications for this visit.     Allergies:   Patient has no known allergies.   Social History:  The patient  reports that she quit smoking about 14 years ago. She has never used smokeless tobacco. She reports that she drinks about 1.2 oz of alcohol per week . She reports that she does not use drugs.   Family History:  The patient's family history includes Breast cancer in her paternal aunt; Breast cancer (age of onset: 5) in her maternal aunt; Breast cancer (age of onset: 95) in her maternal aunt; CAD in her father and mother; Diabetes in her brother and father; Long QT syndrome in her mother.  ROS:  Please see the history of present illness.  All other systems are reviewed and otherwise negative.   PHYSICAL EXAM:  VS:  There were no vitals taken for this visit. BMI: There is no height or weight on file to calculate BMI. O2sat on RA is 98% Well nourished, well developed, in no acute distress  HEENT: normocephalic, atraumatic  Neck: no JVD, carotid bruits or masses Cardiac: *** RRR no significant murmurs, no rubs, or gallops Lungs:  ***CTA b/l, no wheezing, rhonchi or rales  Abd: soft, nontender MS: no deformity or atrophy Ext: no edema, *** no calf tenderness or palpable masses to either LE, no skin hanges Skin: warm and dry, no rash Neuro:  No gross deficits appreciated Psych: euthymic mood, full affect   EKG:  04/04/17, SR, short PR 148ms, QRS 145ms, QTc 474ms  04/06/16: TTE Study Conclusions - Left ventricle: The cavity size was normal. Wall thickness was   normal. Systolic function was normal. The estimated ejection   fraction was in the range of 55% to 60%. Wall motion was normal;   there were no regional wall motion abnormalities. Doppler   parameters are consistent with abnormal left ventricular   relaxation (grade 1 diastolic dysfunction). - Mitral  valve: There was mild regurgitation. - Pulmonary arteries: Systolic pressure was mildly increased. PA   peak pressure: 36 mm Hg (S).  12/17/15: CT chest/abdomen w/contrast  IMPRESSION: 1. Negative CT the chest. The pulmonary arteries are faintly opacified with no central abnormality noted 2. Negative CT the abdomen. No explanation for the patient's left upper quadrant pain is seen  Recent Labs: 03/14/2016: Magnesium 2.0 04/04/2016: B Natriuretic Peptide 17.2 04/07/2016: BUN 19; Creatinine, Ser 0.99; Hemoglobin 12.0; Platelets 317; Potassium 4.3; Sodium 137  No results found for requested labs within last 8760 hours.   CrCl cannot be calculated (Patient's most recent lab result is older than the maximum 21 days allowed.).   Wt Readings from Last 3 Encounters:  04/05/16 285 lb (129.3 kg)  03/29/16 290 lb (131.5 kg)  03/14/16 290 lb (131.5 kg)     Other studies reviewed: Additional studies/records reviewed today include: summarized above   ASSESSMENT AND PLAN:  1. Long QT syndrome     *** Stable, no syncope, on BB  2. Recurrent PE     This event post-op after C-spine surgery, initial event was s/p LE trauma     ***      3. Orthostatic dizziness     ***She is cautioned on standing slowly after sitting for any prolonged period, keeping adequately hydrated, and to sit/lay down immediately if needed with symptoms, she is aware .       Disposition: ***   Current medicines are reviewed at length with the patient today.  The patient did not have any concerns regarding medicines.  Haywood Lasso, PA-C 07/10/2016 4:46 PM     [address] [place] [address] [phone] (office)  [phone] (fax)

## 2016-07-12 ENCOUNTER — Ambulatory Visit: Payer: Medicare Other | Admitting: Physician Assistant

## 2016-07-22 DIAGNOSIS — M5412 Radiculopathy, cervical region: Secondary | ICD-10-CM | POA: Diagnosis not present

## 2016-07-22 DIAGNOSIS — Z6841 Body Mass Index (BMI) 40.0 and over, adult: Secondary | ICD-10-CM | POA: Diagnosis not present

## 2016-08-02 ENCOUNTER — Other Ambulatory Visit: Payer: Self-pay | Admitting: *Deleted

## 2016-08-02 MED ORDER — LABETALOL HCL 200 MG PO TABS: 200.0000 mg | ORAL_TABLET | Freq: Two times a day (BID) | ORAL | 1 refills | Status: DC

## 2016-08-08 ENCOUNTER — Ambulatory Visit: Payer: Medicare Other | Admitting: Physician Assistant

## 2016-09-21 NOTE — Progress Notes (Deleted)
Cardiology Office Note Date:  09/21/2016  Patient ID:  [name] 04/23/1966, MRN [id number] PCP:  [name], NP  Cardiologist:  Dr. Caryl Comes   Chief Complaint: CP, SOB  History of Present Illness: [name] is a 51 y.o. female with history of long, anxiety/ QT syndrome on Inderal, DVT/PE in 20115, and again in Dec 2017, anxiety/depression, HTN, ? seizure d/o, she states this is when she was found with long QT, OSA she reports complaint with CPAP, orthostasis with ocassional positional lightheadedness, no near syncope or syncopal events, morbid obesity, chronic neck pain  She last saw Dr. Caryl Comes April 2016, at that time, he mentions hx of atypical CP/SOB with a negative w/u in 2011, he discontinued her Maxide with hx of orthostais and concerns of hypokalemia given long QT, though was doing well.  She reports today coming in because she feels like she is getting SOB easier with exertion.  She denies exertional CP, palpitations, no rest SOB, no symptoms of PND or orthopnea.  She feels SOB when bending to put her shoes on, this also causes discomfort in her upper abdomen, lower chest that she states has been found to be secondary to a sliding hernia.  She was last seen by EP service in Nov 50m 2017 by myself. At that time she mentioned some exertional intolerances/SOB, suspected to be deconditioning having stopped her usual activities 2/2 severe neck pain.   She has an echo ordered but not done, undergoing C-spine surgery 03/29/17.  04/04/16 she was admitted to [facility] s/p syncopal episode found with acute b/l PE, during this stay she did get an echo, normal LVEF, grade I DD.  She was started on heparin >> lovenox on discharge with recommendation for hematological eval out patient.  *** symptoms *** a/c ??? *** hematological eval? *** any other syncope?? *** syncopal symptoms? *** orthostatic symptoms?   Past Medical History:  Diagnosis Date  . Anemia   . Anxiety   .  Arthritis    osteoarthritis  . Atypical chest pain    negative Myoview in 2011 with no ischemi and normal LV function.   . Depression   . DVT (deep venous thrombosis) ([place])   . GERD (gastroesophageal reflux disease)    uses Nexium  . HTN (hypertension)   . Obesity   . OSA (obstructive sleep apnea)    uses C-PAP  . PE (pulmonary embolism)   . Peripheral vascular disease ([place])    blood clot in right leg  . Prolonged QT interval    gene positive  . Seizures ([place])    hasn't had any for 25 years    Past Surgical History:  Procedure Laterality Date  . ANTERIOR CERVICAL DECOMP/DISCECTOMY FUSION N/A 06/25/2013   Procedure: ANTERIOR CERVICAL DECOMPRESSION/DISCECTOMY FUSION 2 LEVELS LEVELS C FOUR/FIVE, SIX/SEVEN;  Surgeon: Floyce Stakes, MD;  Location: [facility];  Service: Neurosurgery;  Laterality: N/A;  . ANTERIOR CERVICAL DECOMP/DISCECTOMY FUSION N/A 03/29/2016   Procedure: CERVICAL THREE-FOUR, CERVICAL FOUR-FIVE ANTERIOR CERVICAL DECOMPRESSION/DISCECTOMY/FUSION WITH REMOVAL OF PLATES AT F0-9 N2-3;  Surgeon: Leeroy Cha, MD;  Location: [place];  Service: Neurosurgery;  Laterality: N/A;  C3-4 C5-6 ANTERIOR CERVICAL DECOMPRESSION/DISCECTOMY/FUSION WITH POSSIBLE REMOVAL OF PLATE AT F5-7 D2-2  . CARDIAC CATHETERIZATION    . COLONOSCOPY    . DILATION AND CURETTAGE OF UTERUS    . ESOPHAGOGASTRODUODENOSCOPY (EGD) WITH PROPOFOL N/A 11/17/2015   Procedure: ESOPHAGOGASTRODUODENOSCOPY (EGD) WITH PROPOFOL;  Surgeon: Lollie Sails, MD;  Location: [facility]  ENDOSCOPY;  Service: Endoscopy;  Laterality: N/A;  . left foot surgery    . left hand surgery x 2    . LUMBAR DISC SURGERY    . right hand surgery for torn ligament    . TOTAL HIP ARTHROPLASTY      Current Outpatient Prescriptions  Medication Sig Dispense Refill  . albuterol (PROVENTIL HFA;VENTOLIN HFA) 108 (90 BASE) MCG/ACT inhaler Inhale 1-2 puffs into the lungs every 4 (four) hours as needed for wheezing or shortness of breath.    .  budesonide-formoterol (SYMBICORT) 160-4.5 MCG/ACT inhaler Inhale 2 puffs into the lungs 2 (two) times daily as needed (for shortness of breath).    . cyclobenzaprine (FLEXERIL) 10 MG tablet Take 5 mg by mouth 3 (three) times daily as needed for muscle spasms.    Marland Kitchen enoxaparin (LOVENOX) 150 MG/ML injection Inject 0.86 mLs (130 mg total) into the skin every 12 (twelve) hours. Please dispense one month supply. Zero refills. Additional refills per primary physician 0 Syringe   . escitalopram (LEXAPRO) 20 MG tablet Take 20 mg by mouth daily.     Marland Kitchen esomeprazole (NEXIUM) 40 MG capsule Take 1 capsule (40 mg total) by mouth at bedtime. 10 capsule 0  . Ferrous Sulfate (SLOW RELEASE IRON PO) Take 1 tablet by mouth daily.     . fluocinonide (LIDEX) 0.05 % external solution Apply 1 application topically 2 (two) times daily.    . furosemide (LASIX) 20 MG tablet TAKE (1) TABLET BY MOUTH EVERY DAY 30 tablet 5  . Glucosamine-Chondroit-Vit C-Mn (GLUCOSAMINE 1500 COMPLEX) CAPS Take 1 each by mouth daily.    . hyoscyamine (LEVSIN SL) 0.125 MG SL tablet Take 1 tablet by mouth 2 (two) times daily as needed. For nausea    . labetalol (NORMODYNE) 200 MG tablet Take 1 tablet (200 mg total) by mouth 2 (two) times daily. 180 tablet 1  . loratadine (CLARITIN) 10 MG tablet Take 10 mg by mouth daily.    . Multiple Vitamins-Minerals (MULTIVITAMIN WITH MINERALS) tablet Take 1 tablet by mouth daily.    . nabumetone (RELAFEN) 750 MG tablet Take 1 tablet (750 mg total) by mouth 2 (two) times daily. 60 tablet 5  . Omega-3 Fatty Acids (FISH OIL) 1000 MG CAPS Take 1 capsule by mouth 3 (three) times daily.      Marland Kitchen oxyCODONE-acetaminophen (PERCOCET) 10-325 MG tablet Take 1 tablet by mouth every 4 (four) hours as needed for pain.    . potassium chloride SA (K-DUR,KLOR-CON) 20 MEQ tablet Take 1 tablet (20 mEq total) by mouth daily. 30 tablet 5  . spironolactone (ALDACTONE) 25 MG tablet Take 1 tablet (25 mg total) by mouth daily. 30 tablet 5    . triamterene-hydrochlorothiazide (MAXZIDE) 75-50 MG per tablet Take 1 tablet by mouth daily.    Marland Kitchen zolpidem (AMBIEN) 10 MG tablet Take 10 mg by mouth at bedtime as needed for sleep.     No current facility-administered medications for this visit.     Allergies:   Patient has no known allergies.   Social History:  The patient  reports that she quit smoking about 15 years ago. She has never used smokeless tobacco. She reports that she drinks about 1.2 oz of alcohol per week . She reports that she does not use drugs.   Family History:  The patient's family history includes Breast cancer in her paternal aunt; Breast cancer (age of onset: 97) in her maternal aunt; Breast cancer (age of onset: 63) in her maternal aunt;  CAD in her father and mother; Diabetes in her brother and father; Long QT syndrome in her mother.  ROS:  Please see the history of present illness.  All other systems are reviewed and otherwise negative.   PHYSICAL EXAM:  VS:  There were no vitals taken for this visit. BMI: There is no height or weight on file to calculate BMI. O2sat on RA is 98% Well nourished, well developed, in no acute distress  HEENT: normocephalic, atraumatic  Neck: no JVD, carotid bruits or masses Cardiac: *** RRR no significant murmurs, no rubs, or gallops Lungs:  *** CTA b/l, no wheezing, rhonchi or rales  Abd: soft, nontender MS: no deformity or atrophy Ext: *** no edema, no calf tenderness or palpable masses to either LE, no skin hanges Skin: warm and dry, no rash Neuro:  No gross deficits appreciated Psych: euthymic mood, full affect   EKG: 04/04/17 SR 75bpm,  PR 137ms, QRS 11ms, QTc 472ms, diffuse nonspecific T changes  Done11/14/17 SR, nonspecific changes, no ischemic looking changes, and appears unchanged from previous, QTc 49ms, PR 102, QRS 106ms  04/06/16: TTE Study Conclusions - Left ventricle: The cavity size was normal. Wall thickness was   normal. Systolic function was normal. The  estimated ejection   fraction was in the range of 55% to 60%. Wall motion was normal;   there were no regional wall motion abnormalities. Doppler   parameters are consistent with abnormal left ventricular   relaxation (grade 1 diastolic dysfunction). - Mitral valve: There was mild regurgitation. - Pulmonary arteries: Systolic pressure was mildly increased. PA   peak pressure: 36 mm Hg (S).   12/17/15: CT chest/abdomen w/contrast  IMPRESSION: 1. Negative CT the chest. The pulmonary arteries are faintly opacified with no central abnormality noted 2. Negative CT the abdomen. No explanation for the patient's left upper quadrant pain is seen  Recent Labs: 03/14/2016: Magnesium 2.0 04/04/2016: B Natriuretic Peptide 17.2 04/07/2016: BUN 19; Creatinine, Ser 0.99; Hemoglobin 12.0; Platelets 317; Potassium 4.3; Sodium 137  No results found for requested labs within last 8760 hours.   CrCl cannot be calculated (Patient's most recent lab result is older than the maximum 21 days allowed.).   Wt Readings from Last 3 Encounters:  04/05/16 285 lb (129.3 kg)  03/29/16 290 lb (131.5 kg)  03/14/16 290 lb (131.5 kg)     Other studies reviewed: Additional studies/records reviewed today include: summarized above   ASSESSMENT AND PLAN:  1. Long QT syndrome     Stable, no syncope, on BB  *** syncope??      3. Orthostatic dizziness     *** She is cautioned on standing slowly after sitting for any prolonged period, keeping adequately hydrated, and to sit/lay down immediately if needed with symptoms, she is aware .  3. Recurrent PE     ***       Disposition:   Current medicines are reviewed at length with the patient today.  The patient did not have any concerns regarding medicines.  Sonia Lasso, PA-C 09/21/2016 10:18 AM     [facility] [address] [place] [address] [phone] (office)  [phone] (fax)

## 2016-09-22 ENCOUNTER — Ambulatory Visit: Payer: Medicare Other | Admitting: Physician Assistant

## 2016-09-23 ENCOUNTER — Encounter: Payer: Self-pay | Admitting: Physician Assistant

## 2016-10-21 DIAGNOSIS — Z9181 History of falling: Secondary | ICD-10-CM | POA: Diagnosis not present

## 2016-10-21 DIAGNOSIS — M79605 Pain in left leg: Secondary | ICD-10-CM | POA: Diagnosis not present

## 2016-10-21 DIAGNOSIS — S70369A Insect bite (nonvenomous), unspecified thigh, initial encounter: Secondary | ICD-10-CM | POA: Diagnosis not present

## 2016-10-21 DIAGNOSIS — Z6841 Body Mass Index (BMI) 40.0 and over, adult: Secondary | ICD-10-CM | POA: Diagnosis not present

## 2016-11-02 NOTE — Progress Notes (Deleted)
Cardiology Office Note Date:  11/02/2016  Patient ID:  [name] 08-Jan-1966, MRN [id number] PCP:  Sonia Pali, NP  Cardiologist:  Dr. Caryl Comes   Chief Complaint: CP, SOB  History of Present Illness: [name] is a 51 y.o. 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 seizure d/o, she states this is when she was found with long QT, OSA she reports complaint with CPAP, orthostasis with ocassional positional lightheadedness, no near syncope or syncopal events, morbid obesity, chronic neck pain  She last saw Dr. Caryl Comes April 2016, at that time, he mentions hx of atypical CP/SOB with a negative w/u in 2011, he discontinued her Maxide with hx of orthostais and concerns of hypokalemia given long QT, though was doing well.    She was last seen by myself in Nov 13,2017, at that visit she had c/o a unusual DOE, she had CXR was negative and echo was planned.  Note mentions discussion regarding hx of DVT/PE years prior though this was provoked by LE trauma and she had not had any recurrent injury or travel, a fairly recent CT had not suggested PE, exam did not suggest DVT.  She underwent C-spine surgery a few weeks later without noted complications discharged 03/31/17.  On 04/04/17 admitted after a syncopal episode amd c/o SOB, she was found with an acute b/l PE discharged 04/07/17 on lovenox to f/u w/PMD anf referral to heme.  Her echo noted LVEF 55-60%, no RV strain was described. No significant VHD.  *** a/c? *** heme/ *** bleeding? *** symptoms *** syncope *** orthostatic? *** BB, QTc  Past Medical History:  Diagnosis Date  . Anemia   . Anxiety   . Arthritis    osteoarthritis  . Atypical chest pain    negative Myoview in 2011 with no ischemi and normal LV function.   . Depression   . DVT (deep venous thrombosis) ([place])   . GERD (gastroesophageal reflux disease)    uses Nexium  . HTN (hypertension)   . Obesity   . OSA  (obstructive sleep apnea)    uses C-PAP  . PE (pulmonary embolism)   . Peripheral vascular disease ([place])    blood clot in right leg  . Prolonged QT interval    gene positive  . Seizures ([place])    hasn't had any for 25 years    Past Surgical History:  Procedure Laterality Date  . ANTERIOR CERVICAL DECOMP/DISCECTOMY FUSION N/A 06/25/2013   Procedure: ANTERIOR CERVICAL DECOMPRESSION/DISCECTOMY FUSION 2 LEVELS LEVELS C FOUR/FIVE, SIX/SEVEN;  Surgeon: Floyce Stakes, MD;  Location: [facility];  Service: Neurosurgery;  Laterality: N/A;  . ANTERIOR CERVICAL DECOMP/DISCECTOMY FUSION N/A 03/29/2016   Procedure: CERVICAL THREE-FOUR, CERVICAL FOUR-FIVE ANTERIOR CERVICAL DECOMPRESSION/DISCECTOMY/FUSION WITH REMOVAL OF PLATES AT G3-8 V5-6;  Surgeon: Leeroy Cha, MD;  Location: [place];  Service: Neurosurgery;  Laterality: N/A;  C3-4 C5-6 ANTERIOR CERVICAL DECOMPRESSION/DISCECTOMY/FUSION WITH POSSIBLE REMOVAL OF PLATE AT E3-3 I9-5  . CARDIAC CATHETERIZATION    . COLONOSCOPY    . DILATION AND CURETTAGE OF UTERUS    . ESOPHAGOGASTRODUODENOSCOPY (EGD) WITH PROPOFOL N/A 11/17/2015   Procedure: ESOPHAGOGASTRODUODENOSCOPY (EGD) WITH PROPOFOL;  Surgeon: Lollie Sails, MD;  Location: [facility] ENDOSCOPY;  Service: Endoscopy;  Laterality: N/A;  . left foot surgery    . left hand surgery x 2    . LUMBAR DISC SURGERY    . right hand surgery for torn ligament    . TOTAL  HIP ARTHROPLASTY      Current Outpatient Prescriptions  Medication Sig Dispense Refill  . albuterol (PROVENTIL HFA;VENTOLIN HFA) 108 (90 BASE) MCG/ACT inhaler Inhale 1-2 puffs into the lungs every 4 (four) hours as needed for wheezing or shortness of breath.    . budesonide-formoterol (SYMBICORT) 160-4.5 MCG/ACT inhaler Inhale 2 puffs into the lungs 2 (two) times daily as needed (for shortness of breath).    . cyclobenzaprine (FLEXERIL) 10 MG tablet Take 5 mg by mouth 3 (three) times daily as needed for muscle spasms.    Marland Kitchen enoxaparin  (LOVENOX) 150 MG/ML injection Inject 0.86 mLs (130 mg total) into the skin every 12 (twelve) hours. Please dispense one month supply. Zero refills. Additional refills per primary physician 0 Syringe   . escitalopram (LEXAPRO) 20 MG tablet Take 20 mg by mouth daily.     Marland Kitchen esomeprazole (NEXIUM) 40 MG capsule Take 1 capsule (40 mg total) by mouth at bedtime. 10 capsule 0  . Ferrous Sulfate (SLOW RELEASE IRON PO) Take 1 tablet by mouth daily.     . fluocinonide (LIDEX) 0.05 % external solution Apply 1 application topically 2 (two) times daily.    . furosemide (LASIX) 20 MG tablet TAKE (1) TABLET BY MOUTH EVERY DAY 30 tablet 5  . Glucosamine-Chondroit-Vit C-Mn (GLUCOSAMINE 1500 COMPLEX) CAPS Take 1 each by mouth daily.    . hyoscyamine (LEVSIN SL) 0.125 MG SL tablet Take 1 tablet by mouth 2 (two) times daily as needed. For nausea    . labetalol (NORMODYNE) 200 MG tablet Take 1 tablet (200 mg total) by mouth 2 (two) times daily. 180 tablet 1  . loratadine (CLARITIN) 10 MG tablet Take 10 mg by mouth daily.    . Multiple Vitamins-Minerals (MULTIVITAMIN WITH MINERALS) tablet Take 1 tablet by mouth daily.    . nabumetone (RELAFEN) 750 MG tablet Take 1 tablet (750 mg total) by mouth 2 (two) times daily. 60 tablet 5  . Omega-3 Fatty Acids (FISH OIL) 1000 MG CAPS Take 1 capsule by mouth 3 (three) times daily.      Marland Kitchen oxyCODONE-acetaminophen (PERCOCET) 10-325 MG tablet Take 1 tablet by mouth every 4 (four) hours as needed for pain.    . potassium chloride SA (K-DUR,KLOR-CON) 20 MEQ tablet Take 1 tablet (20 mEq total) by mouth daily. 30 tablet 5  . spironolactone (ALDACTONE) 25 MG tablet Take 1 tablet (25 mg total) by mouth daily. 30 tablet 5  . triamterene-hydrochlorothiazide (MAXZIDE) 75-50 MG per tablet Take 1 tablet by mouth daily.    Marland Kitchen zolpidem (AMBIEN) 10 MG tablet Take 10 mg by mouth at bedtime as needed for sleep.     No current facility-administered medications for this visit.     Allergies:   Patient  has no known allergies.   Social History:  The patient  reports that she quit smoking about 15 years ago. She has never used smokeless tobacco. She reports that she drinks about 1.2 oz of alcohol per week . She reports that she does not use drugs.   Family History:  The patient's family history includes Breast cancer in her paternal aunt; Breast cancer (age of onset: 81) in her maternal aunt; Breast cancer (age of onset: 57) in her maternal aunt; CAD in her father and mother; Diabetes in her brother and father; Long QT syndrome in her mother.  ROS:  Please see the history of present illness.  All other systems are reviewed and otherwise negative.   PHYSICAL EXAM:  VS:  There were no vitals taken for this visit. BMI: There is no height or weight on file to calculate BMI. O2sat on RA is 98% Well nourished, well developed, in no acute distress  HEENT: normocephalic, atraumatic  Neck: no JVD, carotid bruits or masses Cardiac: *** RRR no significant murmurs, no rubs, or gallops Lungs:  *** CTA b/l, no wheezing, rhonchi or rales  Abd: soft, nontender MS: no deformity or atrophy Ext: ***no edema, no calf tenderness or palpable masses to either LE, no skin hanges Skin: warm and dry, no rash Neuro:  No gross deficits appreciated Psych: euthymic mood, full affect   EKG: ***  12/17/15: CT chest/abdomen w/contrast  IMPRESSION: 1. Negative CT the chest. The pulmonary arteries are faintly opacified with no central abnormality noted 2. Negative CT the abdomen. No explanation for the patient's left upper quadrant pain is seen  Recent Labs: 03/14/2016: Magnesium 2.0 04/04/2016: B Natriuretic Peptide 17.2 04/07/2016: BUN 19; Creatinine, Ser 0.99; Hemoglobin 12.0; Platelets 317; Potassium 4.3; Sodium 137  No results found for requested labs within last 8760 hours.   CrCl cannot be calculated (Patient's most recent lab result is older than the maximum 21 days allowed.).   Wt Readings from Last 3  Encounters:  04/05/16 285 lb (129.3 kg)  03/29/16 290 lb (131.5 kg)  03/14/16 290 lb (131.5 kg)     Other studies reviewed: Additional studies/records reviewed today include: summarized above   ASSESSMENT AND PLAN:  1. Long QT syndrome     ***Stable, no syncope, on BB  2. Orthostatic dizziness     ***She is cautioned on standing slowly after sitting for any prolonged period, keeping adequately hydrated, and to sit/lay down immediately if needed with symptoms, she is aware .  3. Recurrent PE     ***       Disposition: ***  Current medicines are reviewed at length with the patient today.  The patient did not have any concerns regarding medicines.  Haywood Lasso, PA-C 11/02/2016 10:35 AM     [facility] [address] [place] [address] [phone] (office)  [phone] (fax)

## 2016-11-03 ENCOUNTER — Ambulatory Visit: Payer: Medicare Other | Admitting: Physician Assistant

## 2016-11-22 NOTE — Progress Notes (Signed)
Cardiology Office Note Date:  11/23/2016  Patient ID:  [name], [name] 10-Nov-1965, MRN [id number] PCP:  [name], NP  Cardiologist:  Dr. Caryl Comes   Chief Complaint:  PMD referred for a/c recommendations  History of Present Illness: [name] is a 51 y.o. female with history of long QT syndrome on Inderal, DVT/PE, anxiety/depression, HTN, ? seizure d/o, she states this is when she was found with long QT, OSA she reports complaint with CPAP, orthostasis with ocassional positional lightheadedness, no near syncope or syncopal events, morbid obesity, chronic neck pain  She last saw Dr. Caryl Comes April 2016, at that time, he mentions hx of atypical CP/SOB with a negative w/u in 2011, he discontinued her Maxide with hx of orthostais and concerns of hypokalemia given long QT, though was doing well.    She was last seen by myself in Nov 13,2017, at that visit she had c/o a unusual DOE, she had CXR was negative and echo was planned.  Note mentions discussion regarding hx of DVT/PE years prior though this was provoked by LE trauma and she had not had any recurrent injury or travel, a fairly recent CT had not suggested PE, exam did not suggest DVT.  She underwent C-spine surgery a few weeks later without noted complications discharged 03/31/16.  On 04/04/16 admitted after a syncopal episode and c/o SOB, she was found with an acute b/l PE discharged 04/07/16 on lovenox to f/u w/PMD anf referral to heme.  Her echo noted LVEF 55-60%, no RV strain was described. No significant VHD.  Syncopal event that led to her hospital stay sounds clearly of orthostasis, got out of the care, felt dizzy like she does from time to time upon standing, felt weak, and lightheaded though thought she could get inside to lean against the wall, once insde fainted.  No CP, + SOB on/off for days, no palpitations, + diaphoresis.  She comes today states her PMD started her on xarelto after her hospital stay though afterwards  developed at first minimal/intermittent blood in her urine, and then about 6 weeks or more ago acutely increased to bright red blood every time she used the bathroom, wether it was urine or stool, very bright red, at times maroon colored and very foul smelling, and her menses increased with large clots as well.  She had this ongoing for about a week or more, felt awful, very weak some days staying the day in bed all day, she did not seek attention, figured it was the blood thinner.  She reports a fainting spell during this week when she was out shopping with her son, was having ongoing weakness/no energy and fainted, she did not seek attention then either but stopped the xarelto, her bleeding slowed and stopped.  She has been off a/c a month feeling improved without recurrent syncope or bleeding.  She mentions she felt like she was having unusual abdominal symptoms, not pain.  She reports she had a trip/fall accident 2 weeks ago, hitting her left thigh had large bruising that she was worried about.  She has not had recurrent syncope, bleeding since stopping xarelto, SOB, no CP, palpitations.  Past Medical History:  Diagnosis Date  . Anemia   . Anxiety   . Arthritis    osteoarthritis  . Atypical chest pain    negative Myoview in 2011 with no ischemi and normal LV function.   . Depression   . DVT (deep venous thrombosis) ([place])   . GERD (gastroesophageal  reflux disease)    uses Nexium  . HTN (hypertension)   . Obesity   . OSA (obstructive sleep apnea)    uses C-PAP  . PE (pulmonary embolism)   . Peripheral vascular disease ([place])    blood clot in right leg  . Prolonged QT interval    gene positive  . Seizures ([place])    hasn't had any for 25 years    Past Surgical History:  Procedure Laterality Date  . ANTERIOR CERVICAL DECOMP/DISCECTOMY FUSION N/A 06/25/2013   Procedure: ANTERIOR CERVICAL DECOMPRESSION/DISCECTOMY FUSION 2 LEVELS LEVELS C FOUR/FIVE, SIX/SEVEN;  Surgeon: Floyce Stakes, MD;   Location: [facility];  Service: Neurosurgery;  Laterality: N/A;  . ANTERIOR CERVICAL DECOMP/DISCECTOMY FUSION N/A 03/29/2016   Procedure: CERVICAL THREE-FOUR, CERVICAL FOUR-FIVE ANTERIOR CERVICAL DECOMPRESSION/DISCECTOMY/FUSION WITH REMOVAL OF PLATES AT W0-9 W1-1;  Surgeon: Leeroy Cha, MD;  Location: [place];  Service: Neurosurgery;  Laterality: N/A;  C3-4 C5-6 ANTERIOR CERVICAL DECOMPRESSION/DISCECTOMY/FUSION WITH POSSIBLE REMOVAL OF PLATE AT B1-4 N8-2  . CARDIAC CATHETERIZATION    . COLONOSCOPY    . DILATION AND CURETTAGE OF UTERUS    . ESOPHAGOGASTRODUODENOSCOPY (EGD) WITH PROPOFOL N/A 11/17/2015   Procedure: ESOPHAGOGASTRODUODENOSCOPY (EGD) WITH PROPOFOL;  Surgeon: Lollie Sails, MD;  Location[facility] ENDOSCOPY;  Service: Endoscopy;  Laterality: N/A;  . left foot surgery    . left hand surgery x 2    . LUMBAR DISC SURGERY    . right hand surgery for torn ligament    . TOTAL HIP ARTHROPLASTY      Current Outpatient Prescriptions  Medication Sig Dispense Refill  . albuterol (PROVENTIL HFA;VENTOLIN HFA) 108 (90 BASE) MCG/ACT inhaler Inhale 1-2 puffs into the lungs every 4 (four) hours as needed for wheezing or shortness of breath.    . budesonide-formoterol (SYMBICORT) 160-4.5 MCG/ACT inhaler Inhale 2 puffs into the lungs 2 (two) times daily as needed (for shortness of breath).    . cyclobenzaprine (FLEXERIL) 10 MG tablet Take 5 mg by mouth 3 (three) times daily as needed for muscle spasms.    Marland Kitchen enoxaparin (LOVENOX) 150 MG/ML injection Inject 0.86 mLs (130 mg total) into the skin every 12 (twelve) hours. Please dispense one month supply. Zero refills. Additional refills per primary physician 0 Syringe   . escitalopram (LEXAPRO) 20 MG tablet Take 20 mg by mouth daily.     Marland Kitchen esomeprazole (NEXIUM) 40 MG capsule Take 1 capsule (40 mg total) by mouth at bedtime. 10 capsule 0  . Ferrous Sulfate (SLOW RELEASE IRON PO) Take 1 tablet by mouth daily.     . fluocinonide (LIDEX) 0.05 % external  solution Apply 1 application topically 2 (two) times daily.    . furosemide (LASIX) 20 MG tablet TAKE (1) TABLET BY MOUTH EVERY DAY 30 tablet 5  . Glucosamine-Chondroit-Vit C-Mn (GLUCOSAMINE 1500 COMPLEX) CAPS Take 1 each by mouth daily.    . hyoscyamine (LEVSIN SL) 0.125 MG SL tablet Take 1 tablet by mouth 2 (two) times daily as needed. For nausea    . labetalol (NORMODYNE) 200 MG tablet Take 1 tablet (200 mg total) by mouth 2 (two) times daily. 180 tablet 1  . loratadine (CLARITIN) 10 MG tablet Take 10 mg by mouth daily.    . Multiple Vitamins-Minerals (MULTIVITAMIN WITH MINERALS) tablet Take 1 tablet by mouth daily.    . nabumetone (RELAFEN) 750 MG tablet Take 1 tablet (750 mg total) by mouth 2 (two) times daily. 60 tablet 5  . Omega-3 Fatty Acids (FISH OIL)  1000 MG CAPS Take 1 capsule by mouth 3 (three) times daily.      Marland Kitchen oxyCODONE-acetaminophen (PERCOCET) 10-325 MG tablet Take 1 tablet by mouth every 4 (four) hours as needed for pain.    . potassium chloride SA (K-DUR,KLOR-CON) 20 MEQ tablet Take 1 tablet (20 mEq total) by mouth daily. 30 tablet 5  . spironolactone (ALDACTONE) 25 MG tablet Take 1 tablet (25 mg total) by mouth daily. 30 tablet 5  . triamterene-hydrochlorothiazide (MAXZIDE) 75-50 MG per tablet Take 1 tablet by mouth daily.    Marland Kitchen zolpidem (AMBIEN) 10 MG tablet Take 10 mg by mouth at bedtime as needed for sleep.     No current facility-administered medications for this visit.     Allergies:   Patient has no known allergies.   Social History:  The patient  reports that she quit smoking about 15 years ago. She has never used smokeless tobacco. She reports that she drinks about 1.2 oz of alcohol per week . She reports that she does not use drugs.   Family History:  The patient's family history includes Breast cancer in her paternal aunt; Breast cancer (age of onset: 85) in her maternal aunt; Breast cancer (age of onset: 27) in her maternal aunt; CAD in her father and mother;  Diabetes in her brother and father; Long QT syndrome in her mother.  ROS:  Please see the history of present illness.  All other systems are reviewed and otherwise negative.   PHYSICAL EXAM:  VS:  BP 116/80   Pulse 76   Ht 5\' 4"  (1.626 m)   Wt 274 lb (124.3 kg)   BMI 47.03 kg/m  BMI: Body mass index is 47.03 kg/m. O2sat on RA is 98% Well nourished, well developed, in no acute distress  HEENT: normocephalic, atraumatic  Neck: no JVD, carotid bruits or masses Cardiac: RRR no significant murmurs, no rubs, or gallops Lungs:  CTA b/l, no wheezing, rhonchi or rales  Abd: soft, non-tender, obese MS: no deformity or atrophy Ext: no edema, appears to have healing/old bruising on her L thigh, no hematoma, tenderness Skin: warm and dry, no rash Neuro:  No gross deficits appreciated Psych: euthymic mood, full affect   EKG: SR 73bpm, PR 178ms, QRS 75ms, QTc 432ms, similar to prior  12/17/15: CT chest/abdomen w/contrast  IMPRESSION: 1. Negative CT the chest. The pulmonary arteries are faintly opacified with no central abnormality noted 2. Negative CT the abdomen. No explanation for the patient's left upper quadrant pain is seen  Recent Labs: 03/14/2016: Magnesium 2.0 04/04/2016: B Natriuretic Peptide 17.2 04/07/2016: BUN 19; Creatinine, Ser 0.99; Hemoglobin 12.0; Platelets 317; Potassium 4.3; Sodium 137  No results found for requested labs within last 8760 hours.   CrCl cannot be calculated (Patient's most recent lab result is older than the maximum 21 days allowed.).   Wt Readings from Last 3 Encounters:  11/23/16 274 lb (124.3 kg)  04/05/16 285 lb (129.3 kg)  03/29/16 290 lb (131.5 kg)     Other studies reviewed: Additional studies/records reviewed today include: summarized above   ASSESSMENT AND PLAN:  1. Long QT syndrome     Stable, on BB     She had 2 syncopal events since her last visit, the first very typical of orthostasis with warning and symptoms, the second during  acute blood loss episode and days of profound generalized weakness, neither sound arrhythmogenic   2. Orthostatic dizziness     She is re-cautioned on standing slowly after sitting  for any prolonged period, keeping adequately hydrated, and to sit/lay down immediately if needed with symptoms, she is aware.     She is on 2 diuretics, chronically and states she has swelling without them  3. Recurrent PE     She has stopped her xarelto     She describes significant bleeding associated with symptoms of weakness, fatigue, syncope     Strongly recommend hematology evaluation given recurrent PE as well as re-visit with her PMD to discuss her the bleeding she observed.     She has a GI MD and will see him, she asks for assistance to get referred to hematology, is not comfortable resuming a/c yet and I defer this to her and her PMD, and eventually her hematoligist, she will see her PMD.  She will reach out to her.       Disposition: Will have her wear an event monitor give syncope though neither were typical af arrhythmia, will get CBC and BMET, mag level.  Current medicines are reviewed at length with the patient today.  The patient did not have any concerns regarding medicines.  Haywood Lasso, PA-C 11/23/2016 9:45 AM     [facility] [address] [place] [address] [phone] (office)  [phone] (fax)

## 2016-11-23 ENCOUNTER — Ambulatory Visit (INDEPENDENT_AMBULATORY_CARE_PROVIDER_SITE_OTHER): Payer: Medicare Other

## 2016-11-23 ENCOUNTER — Ambulatory Visit (INDEPENDENT_AMBULATORY_CARE_PROVIDER_SITE_OTHER): Payer: Medicare Other | Admitting: Physician Assistant

## 2016-11-23 VITALS — BP 116/80 | HR 76 | Ht 64.0 in | Wt 274.0 lb

## 2016-11-23 DIAGNOSIS — Z79899 Other long term (current) drug therapy: Secondary | ICD-10-CM

## 2016-11-23 DIAGNOSIS — R9431 Abnormal electrocardiogram [ECG] [EKG]: Secondary | ICD-10-CM | POA: Diagnosis not present

## 2016-11-23 DIAGNOSIS — R55 Syncope and collapse: Secondary | ICD-10-CM | POA: Diagnosis not present

## 2016-11-23 LAB — BASIC METABOLIC PANEL
BUN/Creatinine Ratio: 15 (ref 9–23)
BUN: 18 mg/dL (ref 6–24)
CALCIUM: 9.8 mg/dL (ref 8.7–10.2)
CO2: 26 mmol/L (ref 20–29)
CREATININE: 1.18 mg/dL — AB (ref 0.57–1.00)
Chloride: 98 mmol/L (ref 96–106)
GFR calc Af Amer: 62 mL/min/{1.73_m2} (ref >59)
GFR calc non Af Amer: 54 mL/min/{1.73_m2} — ABNORMAL LOW (ref >59)
GLUCOSE: 109 mg/dL — AB (ref 65–99)
Potassium: 3.8 mmol/L (ref 3.5–5.2)
SODIUM: 141 mmol/L (ref 134–144)

## 2016-11-23 LAB — CBC
Hematocrit: 36.7 % (ref 34.0–46.6)
Hemoglobin: 12.3 g/dL (ref 11.1–15.9)
MCH: 28.6 pg (ref 26.6–33.0)
MCHC: 33.5 g/dL (ref 31.5–35.7)
MCV: 85 fL (ref 79–97)
PLATELETS: 305 10*3/uL (ref 150–379)
RBC: 4.3 x10E6/uL (ref 3.77–5.28)
RDW: 14.7 % (ref 12.3–15.4)
WBC: 7.1 10*3/uL (ref 3.4–10.8)

## 2016-11-23 LAB — MAGNESIUM: Magnesium: 2.2 mg/dL (ref 1.6–2.3)

## 2016-11-23 NOTE — Patient Instructions (Addendum)
Medication Instructions:   Your physician recommends that you continue on your current medications as directed. Please refer to the Current Medication list given to you today.   If you need a refill on your cardiac medications before your next appointment, please call your pharmacy.  Labwork: BMET MAG AND CBC TODAY   Testing/Procedures: Your physician has recommended that you wear an event monitor. Event monitors are medical devices that record the heart's electrical activity. Doctors most often [place] these monitors to diagnose arrhythmias. Arrhythmias are problems with the speed or rhythm of the heartbeat. The monitor is a small, portable device. You can wear one while you do your normal daily activities. This is usually used to diagnose what is causing palpitations/syncope (passing out).     Follow-Up:  IN 6 TO 8 WEEKS WITH URSUY    Any Other Special Instructions Will Be Listed Below (If Applicable).  PLEASE  FOLLOW UP  WITH PRIMARY CARE FOR BLEEDING AND BLOOD IN URINE AND   REFERRAL FOR A HEMATOLOGIST

## 2016-11-24 ENCOUNTER — Telehealth: Payer: Self-pay | Admitting: *Deleted

## 2016-11-24 NOTE — Telephone Encounter (Signed)
-----   Message from [facility] [place] sent at 11/23/2016  7:45 PM EDT ----- Please let the patient know that her labs look OK, she is not anemic, electrolytes look OK.  She needs to see her primary to discuss further her blood thinner therapy and bleeding she observed, this is important, as well as specialists we discussed.  Thanks [employer]

## 2016-11-24 NOTE — Telephone Encounter (Signed)
LMOVM TO CALL BACK  

## 2016-11-28 DIAGNOSIS — Z86711 Personal history of pulmonary embolism: Secondary | ICD-10-CM | POA: Diagnosis not present

## 2016-11-28 DIAGNOSIS — R319 Hematuria, unspecified: Secondary | ICD-10-CM | POA: Diagnosis not present

## 2016-11-28 DIAGNOSIS — Z6841 Body Mass Index (BMI) 40.0 and over, adult: Secondary | ICD-10-CM | POA: Diagnosis not present

## 2016-12-14 ENCOUNTER — Encounter: Payer: Self-pay | Admitting: Physician Assistant

## 2016-12-16 ENCOUNTER — Encounter: Payer: Self-pay | Admitting: Physician Assistant

## 2016-12-23 ENCOUNTER — Encounter: Payer: Self-pay | Admitting: Physician Assistant
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
## 2017-01-05 ENCOUNTER — Ambulatory Visit: Payer: Medicare Other | Admitting: Physician Assistant

## 2017-01-06 ENCOUNTER — Ambulatory Visit: Payer: Medicare Other | Admitting: Physician Assistant

## 2017-01-06 ENCOUNTER — Encounter: Payer: Self-pay | Admitting: Physician Assistant

## 2017-02-15 ENCOUNTER — Other Ambulatory Visit: Payer: Self-pay

## 2017-02-15 MED ORDER — LABETALOL HCL 200 MG PO TABS: 200.0000 mg | ORAL_TABLET | Freq: Two times a day (BID) | ORAL | 1 refills | Status: DC

## 2017-04-06 ENCOUNTER — Other Ambulatory Visit: Payer: Self-pay

## 2017-04-06 ENCOUNTER — Emergency Department (HOSPITAL_COMMUNITY): Payer: Medicare Other

## 2017-04-06 ENCOUNTER — Emergency Department (HOSPITAL_COMMUNITY)
Admission: EM | Admit: 2017-04-06 | Discharge: 2017-04-07 | Disposition: A | Discharge status: Home / Self Care | Payer: Medicare Other | Attending: Emergency Medicine | Admitting: Emergency Medicine

## 2017-04-06 ENCOUNTER — Encounter (HOSPITAL_COMMUNITY): Payer: Self-pay

## 2017-04-06 DIAGNOSIS — Y939 Activity, unspecified: Secondary | ICD-10-CM | POA: Insufficient documentation

## 2017-04-06 DIAGNOSIS — I10 Essential (primary) hypertension: Secondary | ICD-10-CM | POA: Diagnosis not present

## 2017-04-06 DIAGNOSIS — M545 Low back pain: Secondary | ICD-10-CM | POA: Diagnosis not present

## 2017-04-06 DIAGNOSIS — Z79899 Other long term (current) drug therapy: Secondary | ICD-10-CM | POA: Diagnosis not present

## 2017-04-06 DIAGNOSIS — Z87891 Personal history of nicotine dependence: Secondary | ICD-10-CM | POA: Insufficient documentation

## 2017-04-06 DIAGNOSIS — Z86711 Personal history of pulmonary embolism: Secondary | ICD-10-CM | POA: Diagnosis not present

## 2017-04-06 DIAGNOSIS — S161XXA Strain of muscle, fascia and tendon at neck level, initial encounter: Secondary | ICD-10-CM | POA: Diagnosis not present

## 2017-04-06 DIAGNOSIS — M5489 Other dorsalgia: Secondary | ICD-10-CM | POA: Diagnosis not present

## 2017-04-06 DIAGNOSIS — Y92410 Unspecified street and highway as the place of occurrence of the external cause: Secondary | ICD-10-CM | POA: Insufficient documentation

## 2017-04-06 DIAGNOSIS — S199XXA Unspecified injury of neck, initial encounter: Secondary | ICD-10-CM | POA: Diagnosis present

## 2017-04-06 DIAGNOSIS — Z7901 Long term (current) use of anticoagulants: Secondary | ICD-10-CM | POA: Diagnosis not present

## 2017-04-06 DIAGNOSIS — M542 Cervicalgia: Secondary | ICD-10-CM | POA: Diagnosis not present

## 2017-04-06 DIAGNOSIS — S39012A Strain of muscle, fascia and tendon of lower back, initial encounter: Secondary | ICD-10-CM

## 2017-04-06 DIAGNOSIS — Y999 Unspecified external cause status: Secondary | ICD-10-CM | POA: Diagnosis not present

## 2017-04-06 DIAGNOSIS — T148XXA Other injury of unspecified body region, initial encounter: Secondary | ICD-10-CM | POA: Diagnosis not present

## 2017-04-06 MED ORDER — CYCLOBENZAPRINE HCL 10 MG PO TABS: 10.0000 mg | ORAL_TABLET | Freq: Once | ORAL | Status: DC

## 2017-04-06 MED ORDER — CYCLOBENZAPRINE HCL 10 MG PO TABS: 5.0000 mg | ORAL_TABLET | Freq: Three times a day (TID) | ORAL | 0 refills | Status: DC | PRN
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
## 2017-04-06 NOTE — Discharge Instructions (Signed)
Make an appointment for follow up with your primary care provider in the next few days for recheck.
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
## 2017-04-07 NOTE — ED Notes (Signed)
Per Lattie Haw, RN plan to give medication as orders and discharge when pt ride arrives. Pt confirms ride will arrive at 7 am.

## 2017-04-07 NOTE — ED Notes (Signed)
Pt ambulatory to restroom

## 2017-04-19 DIAGNOSIS — Z6841 Body Mass Index (BMI) 40.0 and over, adult: Secondary | ICD-10-CM | POA: Diagnosis not present

## 2017-04-19 DIAGNOSIS — M545 Low back pain: Secondary | ICD-10-CM | POA: Diagnosis not present

## 2017-04-19 DIAGNOSIS — Z7901 Long term (current) use of anticoagulants: Secondary | ICD-10-CM | POA: Diagnosis not present

## 2017-04-19 DIAGNOSIS — Z86711 Personal history of pulmonary embolism: Secondary | ICD-10-CM | POA: Diagnosis not present

## 2017-04-19 DIAGNOSIS — J069 Acute upper respiratory infection, unspecified: Secondary | ICD-10-CM | POA: Diagnosis not present

## 2017-05-10 ENCOUNTER — Other Ambulatory Visit: Payer: Self-pay | Admitting: Internal Medicine

## 2017-05-10 MED ORDER — LABETALOL HCL 200 MG PO TABS: 200.0000 mg | ORAL_TABLET | Freq: Two times a day (BID) | ORAL | 1 refills | Status: DC

## 2017-05-15 DIAGNOSIS — Z79899 Other long term (current) drug therapy: Secondary | ICD-10-CM | POA: Diagnosis not present

## 2017-05-15 DIAGNOSIS — M542 Cervicalgia: Secondary | ICD-10-CM | POA: Diagnosis not present

## 2017-05-15 DIAGNOSIS — Z79891 Long term (current) use of opiate analgesic: Secondary | ICD-10-CM | POA: Diagnosis not present

## 2017-06-15 DIAGNOSIS — M5440 Lumbago with sciatica, unspecified side: Secondary | ICD-10-CM | POA: Diagnosis not present

## 2017-06-15 DIAGNOSIS — Z6841 Body Mass Index (BMI) 40.0 and over, adult: Secondary | ICD-10-CM | POA: Diagnosis not present

## 2017-06-15 DIAGNOSIS — J101 Influenza due to other identified influenza virus with other respiratory manifestations: Secondary | ICD-10-CM | POA: Diagnosis not present

## 2017-07-13 DIAGNOSIS — R112 Nausea with vomiting, unspecified: Secondary | ICD-10-CM | POA: Diagnosis not present

## 2017-07-13 DIAGNOSIS — T50904A Poisoning by unspecified drugs, medicaments and biological substances, undetermined, initial encounter: Secondary | ICD-10-CM | POA: Diagnosis not present

## 2017-07-13 DIAGNOSIS — T50901A Poisoning by unspecified drugs, medicaments and biological substances, accidental (unintentional), initial encounter: Secondary | ICD-10-CM | POA: Diagnosis not present

## 2017-07-13 DIAGNOSIS — R404 Transient alteration of awareness: Secondary | ICD-10-CM | POA: Diagnosis not present

## 2017-07-19 DIAGNOSIS — M542 Cervicalgia: Secondary | ICD-10-CM | POA: Diagnosis not present

## 2017-07-19 DIAGNOSIS — K219 Gastro-esophageal reflux disease without esophagitis: Secondary | ICD-10-CM | POA: Diagnosis not present

## 2017-07-19 DIAGNOSIS — N393 Stress incontinence (female) (male): Secondary | ICD-10-CM | POA: Diagnosis not present

## 2017-07-19 DIAGNOSIS — F339 Major depressive disorder, recurrent, unspecified: Secondary | ICD-10-CM | POA: Diagnosis not present

## 2017-07-19 DIAGNOSIS — D509 Iron deficiency anemia, unspecified: Secondary | ICD-10-CM | POA: Diagnosis not present

## 2017-07-19 DIAGNOSIS — I4581 Long QT syndrome: Secondary | ICD-10-CM | POA: Diagnosis not present

## 2017-07-19 DIAGNOSIS — T50901S Poisoning by unspecified drugs, medicaments and biological substances, accidental (unintentional), sequela: Secondary | ICD-10-CM | POA: Diagnosis not present

## 2017-07-19 DIAGNOSIS — I10 Essential (primary) hypertension: Secondary | ICD-10-CM | POA: Diagnosis not present

## 2017-07-19 DIAGNOSIS — Z6841 Body Mass Index (BMI) 40.0 and over, adult: Secondary | ICD-10-CM | POA: Diagnosis not present

## 2017-07-19 DIAGNOSIS — Z136 Encounter for screening for cardiovascular disorders: Secondary | ICD-10-CM | POA: Diagnosis not present

## 2017-09-04 DIAGNOSIS — M25571 Pain in right ankle and joints of right foot: Secondary | ICD-10-CM | POA: Diagnosis not present

## 2017-09-04 DIAGNOSIS — M79641 Pain in right hand: Secondary | ICD-10-CM | POA: Diagnosis not present

## 2017-09-04 DIAGNOSIS — M2142 Flat foot [pes planus] (acquired), left foot: Secondary | ICD-10-CM | POA: Diagnosis not present

## 2017-09-04 DIAGNOSIS — M2141 Flat foot [pes planus] (acquired), right foot: Secondary | ICD-10-CM | POA: Diagnosis not present

## 2017-09-04 DIAGNOSIS — M25572 Pain in left ankle and joints of left foot: Secondary | ICD-10-CM | POA: Diagnosis not present

## 2017-09-04 DIAGNOSIS — S63641A Sprain of metacarpophalangeal joint of right thumb, initial encounter: Secondary | ICD-10-CM | POA: Diagnosis not present

## 2017-09-04 DIAGNOSIS — M19071 Primary osteoarthritis, right ankle and foot: Secondary | ICD-10-CM | POA: Diagnosis not present

## 2017-09-04 DIAGNOSIS — M79672 Pain in left foot: Secondary | ICD-10-CM | POA: Diagnosis not present

## 2017-09-04 DIAGNOSIS — R52 Pain, unspecified: Secondary | ICD-10-CM | POA: Diagnosis not present

## 2017-10-05 DIAGNOSIS — M2141 Flat foot [pes planus] (acquired), right foot: Secondary | ICD-10-CM | POA: Diagnosis not present

## 2017-10-05 DIAGNOSIS — S6391XA Sprain of unspecified part of right wrist and hand, initial encounter: Secondary | ICD-10-CM | POA: Diagnosis not present

## 2017-10-05 DIAGNOSIS — M79644 Pain in right finger(s): Secondary | ICD-10-CM | POA: Diagnosis not present

## 2017-10-05 DIAGNOSIS — S63641A Sprain of metacarpophalangeal joint of right thumb, initial encounter: Secondary | ICD-10-CM | POA: Diagnosis not present

## 2017-10-05 DIAGNOSIS — M76821 Posterior tibial tendinitis, right leg: Secondary | ICD-10-CM | POA: Diagnosis not present

## 2017-10-05 DIAGNOSIS — M21071 Valgus deformity, not elsewhere classified, right ankle: Secondary | ICD-10-CM | POA: Diagnosis not present

## 2017-10-05 DIAGNOSIS — M25341 Other instability, right hand: Secondary | ICD-10-CM | POA: Diagnosis not present

## 2017-10-09 DIAGNOSIS — M25341 Other instability, right hand: Secondary | ICD-10-CM | POA: Diagnosis not present

## 2017-10-12 DIAGNOSIS — I4581 Long QT syndrome: Secondary | ICD-10-CM | POA: Diagnosis not present

## 2017-10-12 DIAGNOSIS — F329 Major depressive disorder, single episode, unspecified: Secondary | ICD-10-CM | POA: Diagnosis not present

## 2017-10-12 DIAGNOSIS — I10 Essential (primary) hypertension: Secondary | ICD-10-CM | POA: Diagnosis not present

## 2017-10-12 DIAGNOSIS — G4733 Obstructive sleep apnea (adult) (pediatric): Secondary | ICD-10-CM | POA: Diagnosis not present

## 2017-10-12 DIAGNOSIS — K219 Gastro-esophageal reflux disease without esophagitis: Secondary | ICD-10-CM | POA: Diagnosis not present

## 2017-10-12 DIAGNOSIS — Z01818 Encounter for other preprocedural examination: Secondary | ICD-10-CM | POA: Diagnosis not present

## 2017-10-19 ENCOUNTER — Other Ambulatory Visit: Payer: Self-pay | Admitting: Nurse Practitioner

## 2017-10-19 DIAGNOSIS — Z1339 Encounter for screening examination for other mental health and behavioral disorders: Secondary | ICD-10-CM | POA: Diagnosis not present

## 2017-10-19 DIAGNOSIS — D509 Iron deficiency anemia, unspecified: Secondary | ICD-10-CM | POA: Diagnosis not present

## 2017-10-19 DIAGNOSIS — Z136 Encounter for screening for cardiovascular disorders: Secondary | ICD-10-CM | POA: Diagnosis not present

## 2017-10-19 DIAGNOSIS — F339 Major depressive disorder, recurrent, unspecified: Secondary | ICD-10-CM | POA: Diagnosis not present

## 2017-10-19 DIAGNOSIS — Z1231 Encounter for screening mammogram for malignant neoplasm of breast: Secondary | ICD-10-CM

## 2017-10-19 DIAGNOSIS — I4581 Long QT syndrome: Secondary | ICD-10-CM | POA: Diagnosis not present

## 2017-10-19 DIAGNOSIS — I10 Essential (primary) hypertension: Secondary | ICD-10-CM | POA: Diagnosis not present

## 2017-10-19 DIAGNOSIS — K219 Gastro-esophageal reflux disease without esophagitis: Secondary | ICD-10-CM | POA: Diagnosis not present

## 2017-10-23 DIAGNOSIS — M76821 Posterior tibial tendinitis, right leg: Secondary | ICD-10-CM | POA: Diagnosis not present

## 2017-10-23 DIAGNOSIS — M25852 Other specified joint disorders, left hip: Secondary | ICD-10-CM | POA: Diagnosis not present

## 2017-10-23 DIAGNOSIS — M25552 Pain in left hip: Secondary | ICD-10-CM | POA: Diagnosis not present

## 2017-10-23 DIAGNOSIS — M5416 Radiculopathy, lumbar region: Secondary | ICD-10-CM | POA: Diagnosis not present

## 2017-10-24 DIAGNOSIS — G4733 Obstructive sleep apnea (adult) (pediatric): Secondary | ICD-10-CM | POA: Diagnosis not present

## 2017-10-24 DIAGNOSIS — K219 Gastro-esophageal reflux disease without esophagitis: Secondary | ICD-10-CM | POA: Diagnosis not present

## 2017-10-24 DIAGNOSIS — I739 Peripheral vascular disease, unspecified: Secondary | ICD-10-CM | POA: Diagnosis not present

## 2017-10-24 DIAGNOSIS — Z7982 Long term (current) use of aspirin: Secondary | ICD-10-CM | POA: Diagnosis not present

## 2017-10-24 DIAGNOSIS — I10 Essential (primary) hypertension: Secondary | ICD-10-CM | POA: Diagnosis not present

## 2017-10-24 DIAGNOSIS — M25341 Other instability, right hand: Secondary | ICD-10-CM | POA: Diagnosis not present

## 2017-10-24 DIAGNOSIS — S63061A Subluxation of metacarpal (bone), proximal end of right hand, initial encounter: Secondary | ICD-10-CM | POA: Diagnosis not present

## 2017-10-24 DIAGNOSIS — G8918 Other acute postprocedural pain: Secondary | ICD-10-CM | POA: Diagnosis not present

## 2017-10-24 DIAGNOSIS — Z9989 Dependence on other enabling machines and devices: Secondary | ICD-10-CM | POA: Diagnosis not present

## 2017-10-24 DIAGNOSIS — Z86718 Personal history of other venous thrombosis and embolism: Secondary | ICD-10-CM | POA: Diagnosis not present

## 2017-10-24 DIAGNOSIS — Z96649 Presence of unspecified artificial hip joint: Secondary | ICD-10-CM | POA: Diagnosis not present

## 2017-10-24 DIAGNOSIS — F419 Anxiety disorder, unspecified: Secondary | ICD-10-CM | POA: Diagnosis not present

## 2017-10-24 DIAGNOSIS — Z79899 Other long term (current) drug therapy: Secondary | ICD-10-CM | POA: Diagnosis not present

## 2017-10-24 DIAGNOSIS — M810 Age-related osteoporosis without current pathological fracture: Secondary | ICD-10-CM | POA: Diagnosis not present

## 2017-10-24 DIAGNOSIS — Z981 Arthrodesis status: Secondary | ICD-10-CM | POA: Diagnosis not present

## 2017-10-24 DIAGNOSIS — S5321XA Traumatic rupture of right radial collateral ligament, initial encounter: Secondary | ICD-10-CM | POA: Diagnosis not present

## 2017-11-09 DIAGNOSIS — S6991XD Unspecified injury of right wrist, hand and finger(s), subsequent encounter: Secondary | ICD-10-CM | POA: Diagnosis not present

## 2017-11-09 DIAGNOSIS — S63111A Subluxation of metacarpophalangeal joint of right thumb, initial encounter: Secondary | ICD-10-CM | POA: Diagnosis not present

## 2017-11-09 DIAGNOSIS — M25341 Other instability, right hand: Secondary | ICD-10-CM | POA: Insufficient documentation

## 2017-11-30 IMAGING — CT CT CERVICAL SPINE W/O CM
3 of 4 series · 9 of 33 positions shown, 11 images · non-contrast
Comparison: Cervical spine CT - 06/13/2014

CLINICAL DATA: Working on a faucet yesterday with development of
cervical spine pain which radiates into the right shoulder.

EXAM:
CT CERVICAL SPINE WITHOUT CONTRAST
TECHNIQUE: Multidetector CT imaging of the cervical spine was performed without
intravenous contrast. Multiplanar CT image reconstructions were also
generated.

[Series 6: sagittal bone · sagittal · 0.22mm/px · 5 of 45 slices shown, 6 images]
[im 15/45  bone]
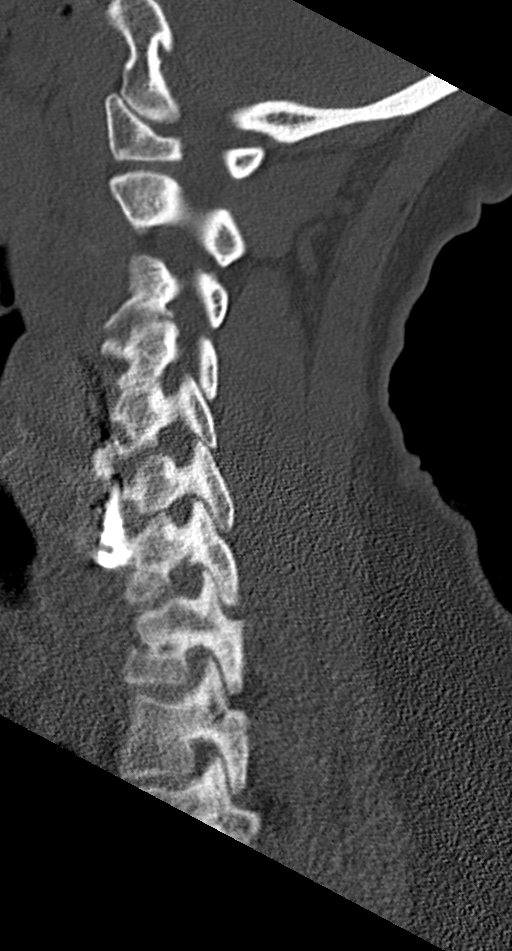
[im 19/45  bone]
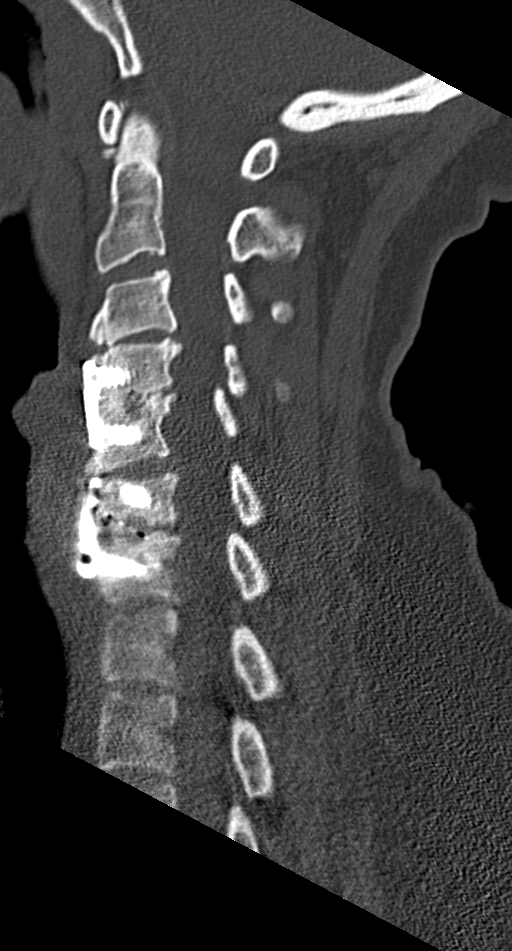
[im 23/45  soft-tissue]
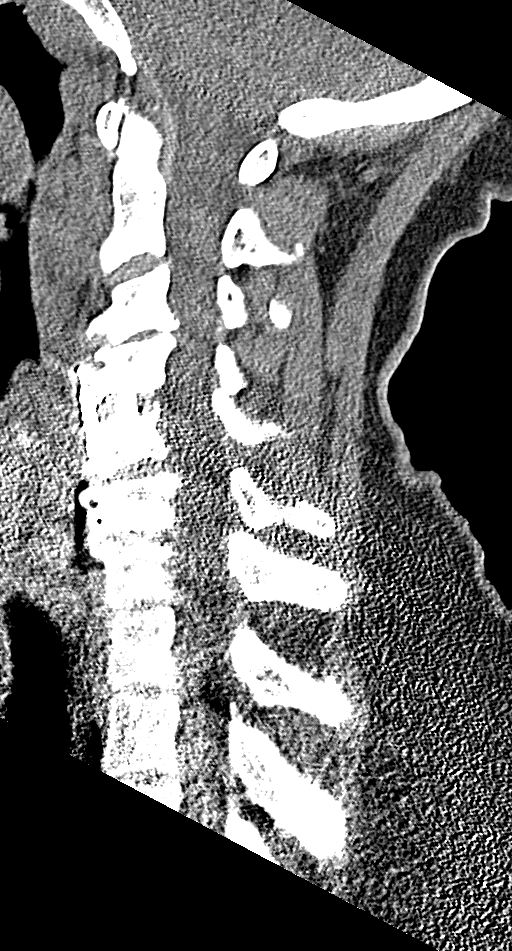
[im 23/45  bone]
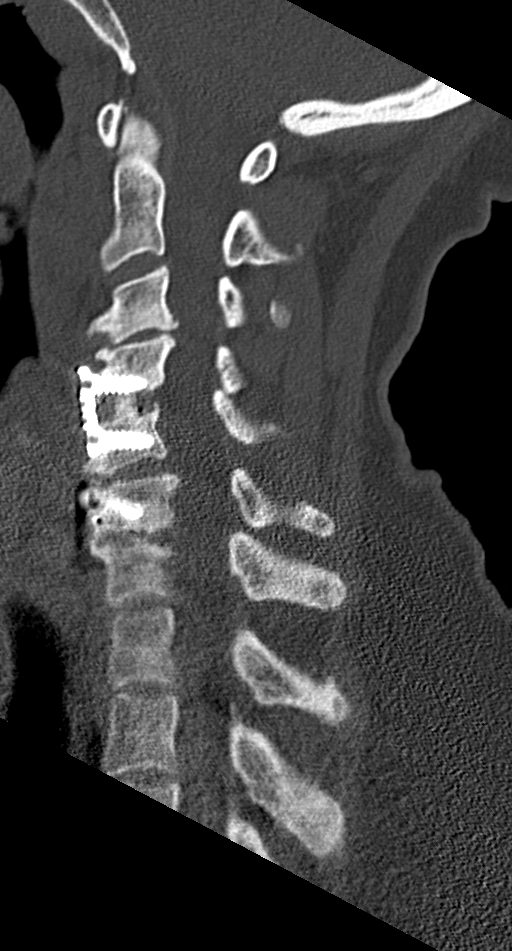
[im 26/45  bone]
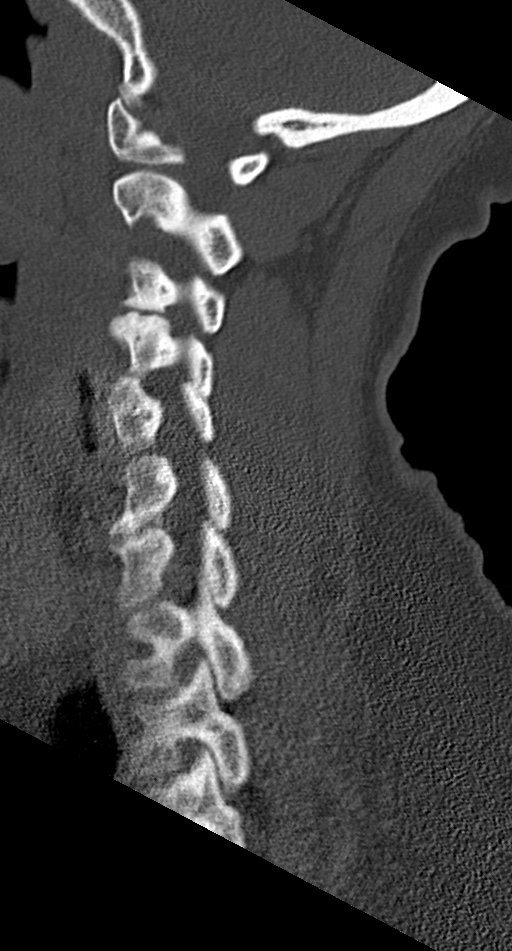
[im 30/45  bone]
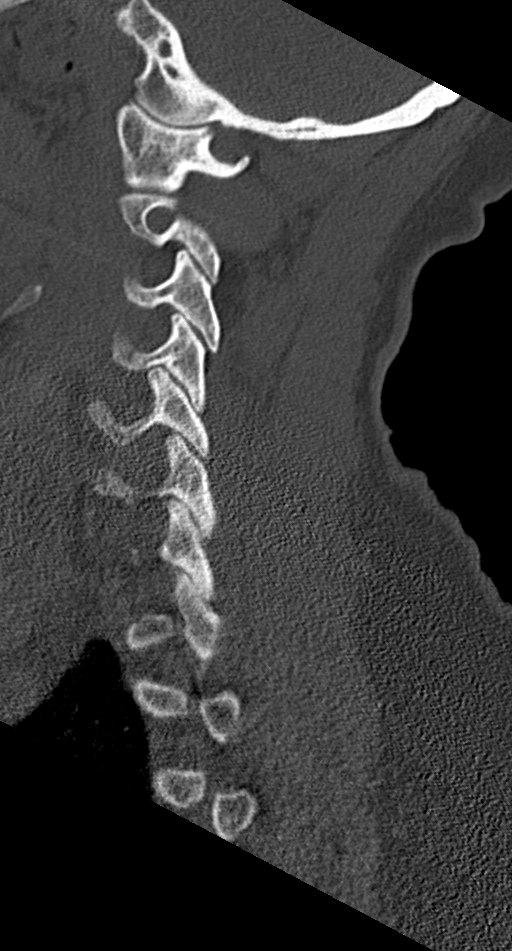

[Series 7: coronal bone · coronal · 0.24mm/px · 3 of 47 slices shown]
[im 10/47  bone]
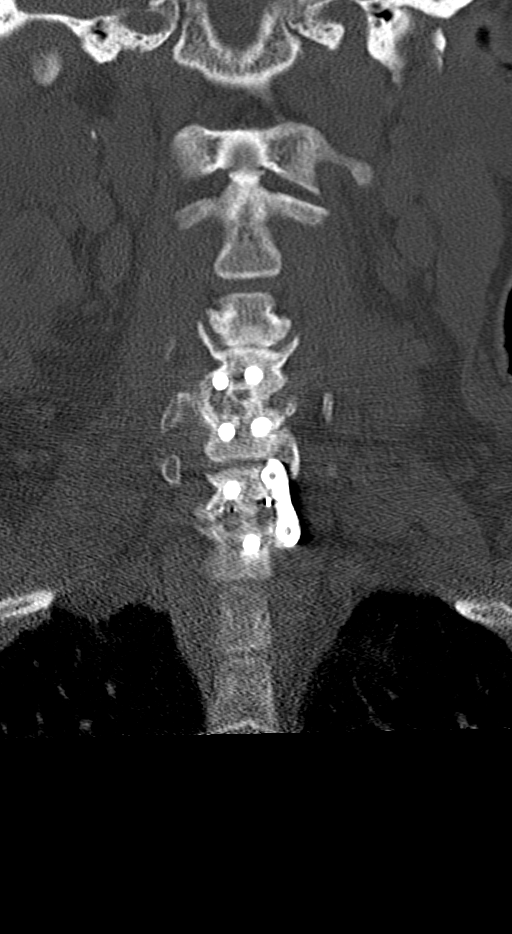
[im 19/47  bone]
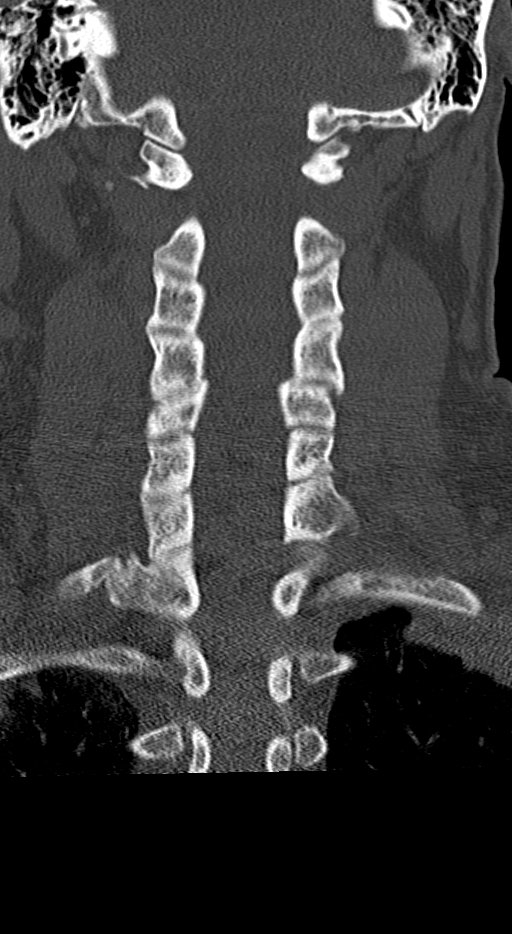
[im 28/47  bone]
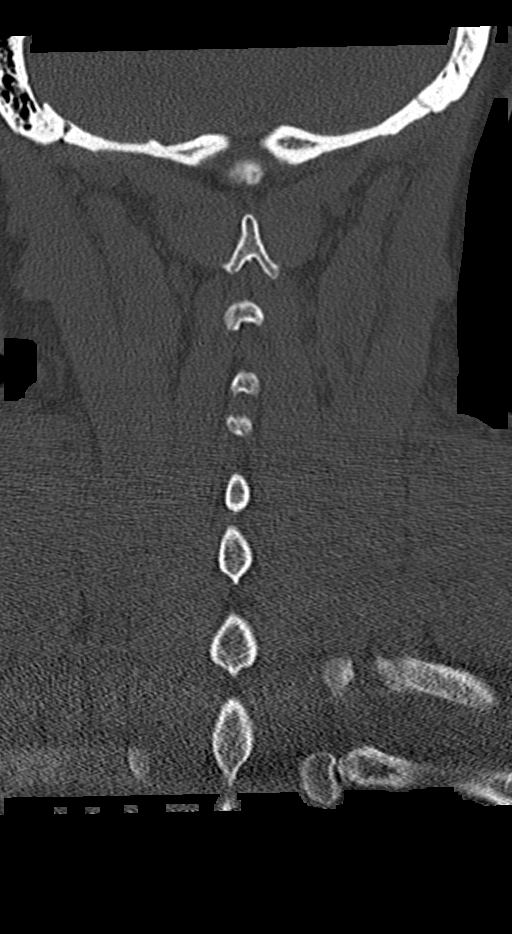

[Series 8: orthogonal bone · axial · 0.18mm/px · z∈[+306,+306]mm · 1 of 103 slices shown, 2 images]
[im 59/103  soft-tissue]
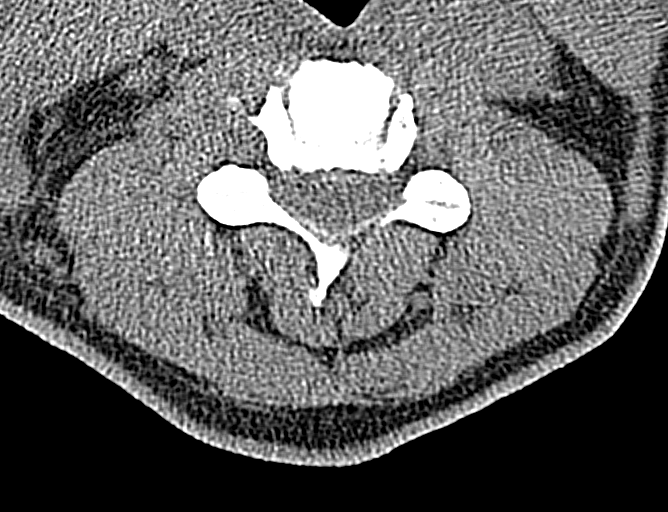
[im 59/103  bone]
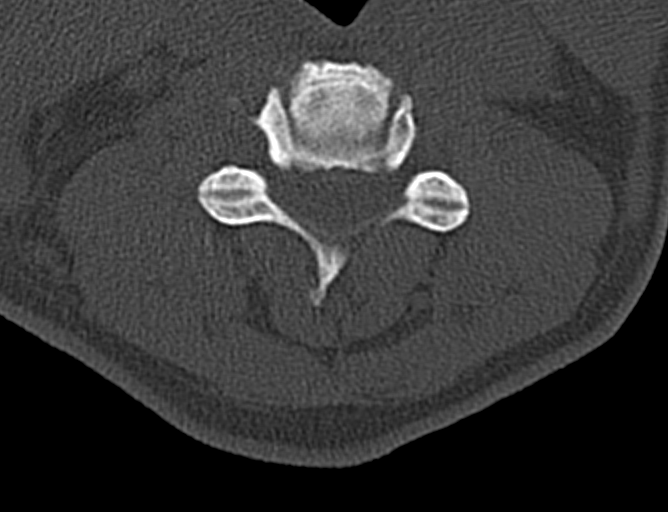

[9 of 33 positions shown; findings below may reference images not displayed]

FINDINGS: C1 to the inferior endplate of T2 is imaged.

There is straightening of the expected cervical lordosis. No
anterolisthesis or retrolisthesis. The bilateral facets are normally
aligned. The dens is normally positioned between the lateral masses
of C1. Normal atlantodental and atlantoaxial articulations. Tiny
ossicles noted about the cranial and caudal aspects of the anterior
arch of C1 under similar to the [DATE] examination.

No fracture or static subluxation of the cervical spine. Cervical
vertebral body heights are preserved. Prevertebral soft tissues are
normal.

Post C4-C5 and C6-C7 ACDF and intervertebral disc space replacement.
No evidence of hardware failure or loosening.

Moderate DDD within the remainder of the cervical spine, worse at
C3-C4 and C5-C6 with disc space height loss, endplate irregularity
and sclerosis, likely progressed since the [DATE] examination.

Scattered bilateral cervical lymph nodes are numerous though
individually not enlarged by size criteria. No definitive bulky
cervical lymphadenopathy on this noncontrast examination.

Normal noncontrast appearance of the thyroid gland. Limited
visualization of the lung apices is normal.
IMPRESSION: 1. No fracture or static subluxation of the cervical spine.
2. Straightening of the expected cervical lordosis, nonspecific
though could be seen in the setting of muscle spasm. Otherwise, no
acute findings.
3. Post C4-C5 and C6-C7 ACDF and intervertebral disc space
replacement without evidence of hardware failure or loosening.
4. Moderate multilevel DDD within the remainder of the cervical
spine, worse at C3-C4 and C5-C6, likely progressed since the [DATE]
examination.

## 2017-12-05 DIAGNOSIS — I10 Essential (primary) hypertension: Secondary | ICD-10-CM | POA: Diagnosis not present

## 2017-12-05 DIAGNOSIS — G8918 Other acute postprocedural pain: Secondary | ICD-10-CM | POA: Diagnosis not present

## 2017-12-05 DIAGNOSIS — M19049 Primary osteoarthritis, unspecified hand: Secondary | ICD-10-CM | POA: Diagnosis not present

## 2017-12-05 DIAGNOSIS — G4733 Obstructive sleep apnea (adult) (pediatric): Secondary | ICD-10-CM | POA: Diagnosis not present

## 2017-12-05 DIAGNOSIS — M25341 Other instability, right hand: Secondary | ICD-10-CM | POA: Diagnosis not present

## 2017-12-05 DIAGNOSIS — K219 Gastro-esophageal reflux disease without esophagitis: Secondary | ICD-10-CM | POA: Diagnosis not present

## 2017-12-07 DIAGNOSIS — M25852 Other specified joint disorders, left hip: Secondary | ICD-10-CM | POA: Diagnosis not present

## 2017-12-07 DIAGNOSIS — M7062 Trochanteric bursitis, left hip: Secondary | ICD-10-CM | POA: Diagnosis not present

## 2017-12-07 DIAGNOSIS — M5416 Radiculopathy, lumbar region: Secondary | ICD-10-CM | POA: Diagnosis not present

## 2017-12-20 DIAGNOSIS — R1012 Left upper quadrant pain: Secondary | ICD-10-CM | POA: Diagnosis not present

## 2017-12-20 DIAGNOSIS — R110 Nausea: Secondary | ICD-10-CM | POA: Diagnosis not present

## 2017-12-20 DIAGNOSIS — Z1211 Encounter for screening for malignant neoplasm of colon: Secondary | ICD-10-CM | POA: Diagnosis not present

## 2017-12-20 DIAGNOSIS — Z6841 Body Mass Index (BMI) 40.0 and over, adult: Secondary | ICD-10-CM | POA: Diagnosis not present

## 2017-12-20 DIAGNOSIS — K219 Gastro-esophageal reflux disease without esophagitis: Secondary | ICD-10-CM | POA: Diagnosis not present

## 2017-12-21 DIAGNOSIS — M25341 Other instability, right hand: Secondary | ICD-10-CM | POA: Diagnosis not present

## 2017-12-21 DIAGNOSIS — Z981 Arthrodesis status: Secondary | ICD-10-CM | POA: Diagnosis not present

## 2018-01-16 ENCOUNTER — Ambulatory Visit: Payer: Medicare Other | Admitting: Certified Registered Nurse Anesthetist

## 2018-01-16 ENCOUNTER — Encounter
Admission: RE | Disposition: A | Discharge status: Home / Self Care | Payer: Self-pay | Source: Ambulatory Visit | Attending: Gastroenterology

## 2018-01-16 ENCOUNTER — Ambulatory Visit
Admission: RE | Admit: 2018-01-16 | Discharge: 2018-01-16 | Disposition: A | Discharge status: Home / Self Care | Payer: Medicare Other | Source: Ambulatory Visit | Attending: Gastroenterology | Admitting: Gastroenterology

## 2018-01-16 ENCOUNTER — Encounter: Payer: Self-pay | Admitting: *Deleted

## 2018-01-16 DIAGNOSIS — K5730 Diverticulosis of large intestine without perforation or abscess without bleeding: Secondary | ICD-10-CM | POA: Insufficient documentation

## 2018-01-16 DIAGNOSIS — Z79899 Other long term (current) drug therapy: Secondary | ICD-10-CM | POA: Insufficient documentation

## 2018-01-16 DIAGNOSIS — F419 Anxiety disorder, unspecified: Secondary | ICD-10-CM | POA: Diagnosis not present

## 2018-01-16 DIAGNOSIS — Z6841 Body Mass Index (BMI) 40.0 and over, adult: Secondary | ICD-10-CM | POA: Diagnosis not present

## 2018-01-16 DIAGNOSIS — G4733 Obstructive sleep apnea (adult) (pediatric): Secondary | ICD-10-CM | POA: Insufficient documentation

## 2018-01-16 DIAGNOSIS — Z86718 Personal history of other venous thrombosis and embolism: Secondary | ICD-10-CM | POA: Insufficient documentation

## 2018-01-16 DIAGNOSIS — K5790 Diverticulosis of intestine, part unspecified, without perforation or abscess without bleeding: Secondary | ICD-10-CM | POA: Diagnosis not present

## 2018-01-16 DIAGNOSIS — K3189 Other diseases of stomach and duodenum: Secondary | ICD-10-CM | POA: Insufficient documentation

## 2018-01-16 DIAGNOSIS — I739 Peripheral vascular disease, unspecified: Secondary | ICD-10-CM | POA: Diagnosis not present

## 2018-01-16 DIAGNOSIS — I10 Essential (primary) hypertension: Secondary | ICD-10-CM | POA: Insufficient documentation

## 2018-01-16 DIAGNOSIS — K644 Residual hemorrhoidal skin tags: Secondary | ICD-10-CM | POA: Diagnosis not present

## 2018-01-16 DIAGNOSIS — K219 Gastro-esophageal reflux disease without esophagitis: Secondary | ICD-10-CM | POA: Diagnosis not present

## 2018-01-16 DIAGNOSIS — K2970 Gastritis, unspecified, without bleeding: Secondary | ICD-10-CM | POA: Diagnosis not present

## 2018-01-16 DIAGNOSIS — Z7982 Long term (current) use of aspirin: Secondary | ICD-10-CM | POA: Insufficient documentation

## 2018-01-16 DIAGNOSIS — Z1211 Encounter for screening for malignant neoplasm of colon: Secondary | ICD-10-CM | POA: Insufficient documentation

## 2018-01-16 DIAGNOSIS — F329 Major depressive disorder, single episode, unspecified: Secondary | ICD-10-CM | POA: Diagnosis not present

## 2018-01-16 DIAGNOSIS — K2960 Other gastritis without bleeding: Secondary | ICD-10-CM | POA: Diagnosis not present

## 2018-01-16 DIAGNOSIS — Z87891 Personal history of nicotine dependence: Secondary | ICD-10-CM | POA: Diagnosis not present

## 2018-01-16 DIAGNOSIS — Z86711 Personal history of pulmonary embolism: Secondary | ICD-10-CM | POA: Diagnosis not present

## 2018-01-16 DIAGNOSIS — K210 Gastro-esophageal reflux disease with esophagitis: Secondary | ICD-10-CM | POA: Insufficient documentation

## 2018-01-16 DIAGNOSIS — K449 Diaphragmatic hernia without obstruction or gangrene: Secondary | ICD-10-CM | POA: Diagnosis not present

## 2018-01-16 DIAGNOSIS — R1012 Left upper quadrant pain: Secondary | ICD-10-CM | POA: Diagnosis not present

## 2018-01-16 HISTORY — PX: COLONOSCOPY WITH PROPOFOL: SHX5780

## 2018-01-16 HISTORY — DX: Long QT syndrome: I45.81

## 2018-01-16 HISTORY — DX: Benign neoplasm of colon, unspecified: D12.6

## 2018-01-16 HISTORY — DX: Dyspnea, unspecified: R06.00

## 2018-01-16 HISTORY — PX: ESOPHAGOGASTRODUODENOSCOPY (EGD) WITH PROPOFOL: SHX5813

## 2018-01-16 SURGERY — COLONOSCOPY WITH PROPOFOL
Anesthesia: General

## 2018-01-16 MED ORDER — LIDOCAINE HCL (PF) 2 % IJ SOLN
INTRAMUSCULAR | Status: AC
  Filled 2018-01-16: qty 10

## 2018-01-16 MED ORDER — FENTANYL CITRATE (PF) 100 MCG/2ML IJ SOLN
INTRAMUSCULAR | Status: AC
  Filled 2018-01-16: qty 2

## 2018-01-16 MED ORDER — PROPOFOL 500 MG/50ML IV EMUL
INTRAVENOUS | Status: AC
  Filled 2018-01-16: qty 50

## 2018-01-16 MED ORDER — PROPOFOL 10 MG/ML IV BOLUS
INTRAVENOUS | Status: AC
  Filled 2018-01-16: qty 20

## 2018-01-16 MED ORDER — SODIUM CHLORIDE 0.9 % IV SOLN
INTRAVENOUS | Status: DC
  Administered 2018-01-16: 1000 mL via INTRAVENOUS

## 2018-01-16 MED ORDER — FENTANYL CITRATE (PF) 100 MCG/2ML IJ SOLN
INTRAMUSCULAR | Status: DC | PRN
  Administered 2018-01-16: 50 ug via INTRAVENOUS

## 2018-01-16 MED ORDER — MIDAZOLAM HCL 2 MG/2ML IJ SOLN
INTRAMUSCULAR | Status: AC
  Filled 2018-01-16: qty 2

## 2018-01-16 MED ORDER — LIDOCAINE HCL (CARDIAC) PF 100 MG/5ML IV SOSY
PREFILLED_SYRINGE | INTRAVENOUS | Status: DC | PRN
  Administered 2018-01-16: 50 mg via INTRAVENOUS

## 2018-01-16 MED ORDER — PROPOFOL 10 MG/ML IV BOLUS
INTRAVENOUS | Status: DC | PRN
  Administered 2018-01-16: 100 mg via INTRAVENOUS

## 2018-01-16 MED ORDER — MIDAZOLAM HCL 2 MG/2ML IJ SOLN
INTRAMUSCULAR | Status: DC | PRN
  Administered 2018-01-16: 2 mg via INTRAVENOUS

## 2018-01-16 MED ORDER — PROPOFOL 500 MG/50ML IV EMUL
INTRAVENOUS | Status: DC | PRN
  Administered 2018-01-16: 140 ug/kg/min via INTRAVENOUS

## 2018-01-16 NOTE — Anesthesia Preprocedure Evaluation (Signed)
Anesthesia Evaluation  Patient identified by MRN, date of birth, ID band Patient awake    Reviewed: Allergy & Precautions, H&P , NPO status , Patient's Chart, lab work & pertinent test results  History of Anesthesia Complications Negative for: history of anesthetic complications  Airway Mallampati: III  TM Distance: <3 FB Neck ROM: full    Dental  (+) Chipped, Poor Dentition   Pulmonary neg shortness of breath, sleep apnea and Continuous Positive Airway Pressure Ventilation , former smoker,           Cardiovascular Exercise Tolerance: Good hypertension, (-) angina+ Peripheral Vascular Disease  (-) Past MI and (-) DOE      Neuro/Psych Seizures -, Well Controlled,  PSYCHIATRIC DISORDERS Anxiety Depression  Neuromuscular disease    GI/Hepatic Neg liver ROS, GERD  Medicated and Controlled,  Endo/Other  negative endocrine ROS  Renal/GU negative Renal ROS  negative genitourinary   Musculoskeletal  (+) Arthritis ,   Abdominal   Peds  Hematology negative hematology ROS (+)   Anesthesia Other Findings Past Medical History: No date: Anemia No date: Anxiety No date: Arthritis     Comment:  osteoarthritis No date: Atypical chest pain     Comment:  negative Myoview in 2011 with no ischemi and normal LV               function.  No date: Benign neoplasm of colon No date: Depression No date: DVT (deep venous thrombosis) (HCC) No date: Dyspnea No date: GERD (gastroesophageal reflux disease)     Comment:  uses Nexium No date: HTN (hypertension) No date: Long Q-T syndrome No date: Obesity No date: OSA (obstructive sleep apnea)     Comment:  uses C-PAP No date: PE (pulmonary embolism) No date: Peripheral vascular disease (HCC)     Comment:  blood clot in right leg No date: Prolonged QT interval     Comment:  gene positive No date: Seizures ([place])     Comment:  hasn't had any for 25 years  Past Surgical  History: 06/25/2013: ANTERIOR CERVICAL DECOMP/DISCECTOMY FUSION; N/A     Comment:  Procedure: ANTERIOR CERVICAL DECOMPRESSION/DISCECTOMY               FUSION 2 LEVELS LEVELS C FOUR/FIVE, SIX/SEVEN;  Surgeon:               Floyce Stakes, MD;  Location: [facility];  Service:               Neurosurgery;  Laterality: N/A; 03/29/2016: ANTERIOR CERVICAL DECOMP/DISCECTOMY FUSION; N/A     Comment:  Procedure: CERVICAL THREE-FOUR, CERVICAL FOUR-FIVE               ANTERIOR CERVICAL DECOMPRESSION/DISCECTOMY/FUSION WITH               REMOVAL OF PLATES AT X5-1 Z0-0;  Surgeon: Leeroy Cha,              MD;  Location: [place];  Service: Neurosurgery;                Laterality: N/A;  C3-4 C5-6 ANTERIOR CERVICAL               DECOMPRESSION/DISCECTOMY/FUSION WITH POSSIBLE REMOVAL OF               PLATE AT F7-4 B4-4 No date: BACK SURGERY No date: CARDIAC CATHETERIZATION No date: COLONOSCOPY No date: DILATION AND CURETTAGE OF UTERUS 11/17/2015: ESOPHAGOGASTRODUODENOSCOPY (EGD) WITH PROPOFOL; N/A     Comment:  Procedure: ESOPHAGOGASTRODUODENOSCOPY (EGD) WITH               PROPOFOL;  Surgeon: Lollie Sails, MD;  Location:               [facility] ENDOSCOPY;  Service: Endoscopy;  Laterality: N/A; 04/2006: JOINT REPLACEMENT     Comment:  hip No date: left foot surgery No date: left hand surgery x 2 No date: LUMBAR DISC SURGERY No date: right hand surgery for torn ligament No date: TOTAL HIP ARTHROPLASTY     Reproductive/Obstetrics negative OB ROS                             Anesthesia Physical Anesthesia Plan  ASA: III  Anesthesia Plan: General   Post-op Pain Management:    Induction: Intravenous  PONV Risk Score and Plan: Propofol infusion and TIVA  Airway Management Planned: Natural Airway and Nasal Cannula  Additional Equipment:   Intra-op Plan:   Post-operative Plan:   Informed Consent: I have reviewed the patients History and Physical, chart, labs and  discussed the procedure including the risks, benefits and alternatives for the proposed anesthesia with the patient or authorized representative who has indicated his/her understanding and acceptance.   Dental Advisory Given  Plan Discussed with: Anesthesiologist, CRNA and Surgeon  Anesthesia Plan Comments: (Patient consented for risks of anesthesia including but not limited to:  - adverse reactions to medications - risk of intubation if required - damage to teeth, lips or other oral mucosa - sore throat or hoarseness - Damage to heart, brain, lungs or loss of life  Patient voiced understanding.)        Anesthesia Quick Evaluation

## 2018-01-16 NOTE — Transfer of Care (Signed)
Immediate Anesthesia Transfer of Care Note  Patient: [name]  Procedure(s) Performed: COLONOSCOPY WITH PROPOFOL (N/A ) ESOPHAGOGASTRODUODENOSCOPY (EGD) WITH PROPOFOL (N/A )  Patient Location: PACU and Endoscopy Unit  Anesthesia Type:General  Level of Consciousness: awake, alert  and oriented  Airway & Oxygen Therapy: Patient Spontanous Breathing and Patient connected to nasal cannula oxygen  Post-op Assessment: Report given to RN and Post -op Vital signs reviewed and stable  Post vital signs: Reviewed and stable  Last Vitals:  Vitals Value Taken Time  BP    Temp    Pulse    Resp    SpO2      Last Pain:  Vitals:   01/16/18 1023  TempSrc:   PainSc: 0-No pain         Complications: No apparent anesthesia complications

## 2018-01-16 NOTE — H&P (Signed)
Outpatient short stay form Pre-procedure 01/16/2018 10:44 AM Sonia Sails MD  Primary Physician: Charlott Holler, NP  Reason for visit: EGD and colonoscopy  History of present illness: [name] is a 52 year old female presenting today as above.  She has personal history of GERD nausea and left upper quadrant pain.  She has been taking number of NSAIDs as well as Relafen.  Question will history of esophageal varices.  alSo presenting for colon cancer screening today.  She tolerated her prep well.  Takes 81 mg aspirin daily but no other aspirin type products or prescribed blood thinning agents.    Current Facility-Administered Medications:  .  0.9 %  sodium chloride infusion, , Intravenous, Continuous, Sonia Sails, MD, Last Rate: 20 mL/hr at 01/16/18 1040, 1,000 mL at 01/16/18 1040  Medications Prior to Admission  Medication Sig Dispense Refill Last Dose  . albuterol (PROVENTIL HFA;VENTOLIN HFA) 108 (90 BASE) MCG/ACT inhaler Inhale 1-2 puffs into the lungs every 4 (four) hours as needed for wheezing or shortness of breath.   01/16/2018 at Unknown time  . budesonide-formoterol (SYMBICORT) 160-4.5 MCG/ACT inhaler Inhale 2 puffs into the lungs 2 (two) times daily as needed (for shortness of breath).   01/16/2018 at Unknown time  . hyoscyamine (LEVSIN, ANASPAZ) 0.125 MG tablet Take 0.125 mg by mouth daily.     Marland Kitchen labetalol (NORMODYNE) 200 MG tablet Take 1 tablet (200 mg total) by mouth 2 (two) times daily. 180 tablet 1 01/15/2018 at Unknown time  . cyclobenzaprine (FLEXERIL) 10 MG tablet Take 0.5 tablets (5 mg total) by mouth 3 (three) times daily as needed for muscle spasms. 20 tablet 0 Past Week at Unknown time  . escitalopram (LEXAPRO) 20 MG tablet Take 20 mg by mouth daily.    Past Week at Unknown time  . esomeprazole (NEXIUM) 40 MG capsule Take 1 capsule (40 mg total) by mouth at bedtime. 10 capsule 0 Past Week at Unknown time  . Ferrous Sulfate (SLOW RELEASE IRON PO) Take 142 mg by mouth  daily.    Past Week at Unknown time  . fluocinonide (LIDEX) 0.05 % external solution Apply 1 application topically 2 (two) times daily.   Past Week at Unknown time  . Glucosamine-Chondroit-Vit C-Mn (GLUCOSAMINE 1500 COMPLEX) CAPS Take 1 each by mouth daily.   Past Week at Unknown time  . loratadine (CLARITIN) 10 MG tablet Take 10 mg by mouth daily.   Past Week at Unknown time  . Multiple Vitamins-Minerals (MULTIVITAMIN WITH MINERALS) tablet Take 1 tablet by mouth daily.   Past Week at Unknown time  . nabumetone (RELAFEN) 750 MG tablet Take 1 tablet (750 mg total) by mouth 2 (two) times daily. 60 tablet 5 Past Week at Unknown time  . Omega-3 Fatty Acids (FISH OIL) 1000 MG CAPS Take 1 capsule by mouth 3 (three) times daily.     Past Week at Unknown time  . potassium chloride SA (K-DUR,KLOR-CON) 20 MEQ tablet Take 1 tablet (20 mEq total) by mouth daily. 30 tablet 5 Past Week at Unknown time  . spironolactone (ALDACTONE) 25 MG tablet Take 1 tablet (25 mg total) by mouth daily. ([name] not taking: Reported on 04/07/2017) 30 tablet 5 Not Taking at Unknown time  . spironolactone-hydrochlorothiazide (ALDACTAZIDE) 25-25 MG tablet Take 1 tablet by mouth every morning.    Past Week at Unknown time  . triamterene-hydrochlorothiazide (MAXZIDE) 75-50 MG tablet Take 1 tablet by mouth every evening.    Past Week at Unknown time  . zolpidem (AMBIEN)  10 MG tablet Take 10 mg by mouth at bedtime as needed for sleep.   Past Week at Unknown time     No Known Allergies   Past Medical History:  Diagnosis Date  . Anemia   . Anxiety   . Arthritis    osteoarthritis  . Atypical chest pain    negative Myoview in 2011 with no ischemi and normal LV function.   . Benign neoplasm of colon   . Depression   . DVT (deep venous thrombosis) ([place])   . Dyspnea   . GERD (gastroesophageal reflux disease)    uses Nexium  . HTN (hypertension)   . Long Q-T syndrome   . Obesity   . OSA (obstructive sleep apnea)    uses C-PAP   . PE (pulmonary embolism)   . Peripheral vascular disease ([place])    blood clot in right leg  . Prolonged QT interval    gene positive  . Seizures ([place])    hasn't had any for 25 years    Review of systems:      Physical Exam    Heart and lungs: Regular rate and rhythm without rub or gallop, lungs are bilaterally clear.    HEENT: Normocephalic atraumatic eyes are anicteric    Other:    Pertinant exam for procedure: Soft nontender nondistended bowel sounds positive normoactive, mild discomfort palpation left upper quadrant..    Planned proceedures: EGD, colonoscopy and indicated procedures. I have discussed the risks benefits and complications of procedures to include not limited to bleeding, infection, perforation and the risk of sedation and the [name] wishes to proceed.    Sonia Sails, MD Gastroenterology 01/16/2018  10:44 AM

## 2018-01-16 NOTE — Op Note (Signed)
[facility] Gastroenterology Patient Name: [name] Procedure Date: 01/16/2018 10:38 AM MRN: [id number] Account #: [id number] Date of Birth: Nov 30, 1965 Admit Type: Outpatient Age: 52 Room: [facility] ENDO ROOM 2 Gender: Female Note Status: Finalized Procedure:            Upper GI endoscopy Indications:          Abdominal pain in the left upper quadrant, Nausea Providers:            Lollie Sails, MD Referring MD:         [name] (Referring MD) Medicines:            Monitored Anesthesia Care Complications:        No immediate complications. Procedure:            Pre-Anesthesia Assessment:                       - ASA Grade Assessment: III - A patient with severe                        systemic disease.                       After obtaining informed consent, the endoscope was                        passed under direct vision. Throughout the procedure,                        the patient's blood pressure, pulse, and oxygen                        saturations were monitored continuously. The Endoscope                        was introduced through the mouth, and advanced to the                        third part of duodenum. The upper GI endoscopy was                        accomplished without difficulty. The patient tolerated                        the procedure. Findings:      A small hiatal hernia was found. The Z-line was a variable distance from       incisors; the hiatal hernia was sliding.      The Z-line was variable. Biopsies were taken with a cold forceps for       histology. There is some mild prominance of the vessels in the distal       esophagus, though no overt evidence of varices.      Diffuse mild inflammation characterized by congestion (edema) was found       in the gastric body. Biopsies were taken with a cold forceps for       histology.      Patchy mild inflammation characterized by congestion (edema) and       erythema was found at the  incisura. Biopsies were taken with a cold       forceps for histology.      Diffuse  mild mucosal variance characterized by smoothness was found in       the second portion of the duodenum and in the third portion of the       duodenum. Biopsies were taken with a cold forceps for histology.      The cardia and gastric fundus were normal on retroflexion otherwise. Impression:           - Small hiatal hernia.                       - Z-line variable. Biopsied.                       - Gastritis. Biopsied.                       - Erosive gastritis. Biopsied.                       - Mucosal variant in the duodenum. Biopsied. Recommendation:       - Use Protonix (pantoprazole) 40 mg PO daily.                       - Use sucralfate tablets 1 gram PO QID for 1 month.                       - Return to GI clinic in 1 month. Procedure Code(s):    --- Professional ---                       [phone], Esophagogastroduodenoscopy, flexible, transoral;                        with biopsy, single or multiple Diagnosis Code(s):    --- Professional ---                       K44.9, Diaphragmatic hernia without obstruction or                        gangrene                       K22.8, Other specified diseases of esophagus                       K29.70, Gastritis, unspecified, without bleeding                       K29.60, Other gastritis without bleeding                       K31.89, Other diseases of stomach and duodenum                       R10.12, Left upper quadrant pain                       R11.0, Nausea CPT copyright 2017 American Medical Association. All rights reserved. The codes documented in this report are preliminary and upon coder review may  be revised to meet current compliance requirements. Lollie Sails, MD 01/16/2018 11:17:38 AM This report has been signed electronically. Number of Addenda: 0 Note Initiated On: 01/16/2018 10:38 AM  [facility]

## 2018-01-16 NOTE — Op Note (Signed)
[facility] Gastroenterology Patient Name: [name] Procedure Date: 01/16/2018 10:37 AM MRN: [id number] Account #: [id number] Date of Birth: Oct 15, 1965 Admit Type: Outpatient Age: 52 Room: [facility] ENDO ROOM 2 Gender: Female Note Status: Finalized Procedure:            Colonoscopy Indications:          Screening for colorectal malignant neoplasm Providers:            Lollie Sails, MD Referring MD:         [name] (Referring MD) Medicines:            Monitored Anesthesia Care Complications:        No immediate complications. Procedure:            Pre-Anesthesia Assessment:                       - ASA Grade Assessment: III - A patient with severe                        systemic disease.                       After obtaining informed consent, the colonoscope was                        passed under direct vision. Throughout the procedure,                        the patient's blood pressure, pulse, and oxygen                        saturations were monitored continuously. The                        Colonoscope was introduced through the anus and                        advanced to the the ileocecal valve. The colonoscopy                        was unusually difficult due to poor bowel prep, a                        redundant colon, significant looping and the patient's                        body habitus. Successful completion of the procedure                        was aided by lavage. The patient tolerated the                        procedure well. The quality of the bowel preparation                        was fair. Findings:      Multiple small-mouthed diverticula were found in the sigmoid colon and       descending colon.      The sigmoid colon, descending colon and transverse colon were       significantly redundant. The scope was  advanced to the IC valve, however       the cecum could not be intubated. As seen from a distance there was no   noted abnormality.      The retroflexed view of the distal rectum and anal verge was normal and       showed no anal or rectal abnormalities.      Skin tags were found on perianal exam. Impression:           - Preparation of the colon was fair.                       - Diverticulosis in the sigmoid colon and in the                        descending colon.                       - Redundant colon.                       - The distal rectum and anal verge are normal on                        retroflexion view.                       - Perianal skin tags found on perianal exam.                       - No specimens collected. Recommendation:       - Discharge patient to home.                       - Soft diet today, then advance as tolerated to advance                        diet as tolerated. Procedure Code(s):    --- Professional ---                       [phone], Colonoscopy, flexible; diagnostic, including                        collection of specimen(s) by brushing or washing, when                        performed (separate procedure) Diagnosis Code(s):    --- Professional ---                       Z12.11, Encounter for screening for malignant neoplasm                        of colon                       K64.4, Residual hemorrhoidal skin tags                       K57.30, Diverticulosis of large intestine without                        perforation or abscess without bleeding  Q43.8, Other specified congenital malformations of                        intestine CPT copyright 2017 American Medical Association. All rights reserved. The codes documented in this report are preliminary and upon coder review may  be revised to meet current compliance requirements. Lollie Sails, MD 01/16/2018 11:58:46 AM This report has been signed electronically. Number of Addenda: 0 Note Initiated On: 01/16/2018 10:37 AM Scope Withdrawal Time: 0 hours 7 minutes 36 seconds  Total  Procedure Duration: 0 hours 28 minutes 20 seconds       [facility]

## 2018-01-16 NOTE — Anesthesia Post-op Follow-up Note (Signed)
Anesthesia QCDR form completed.        

## 2018-01-16 NOTE — Anesthesia Postprocedure Evaluation (Signed)
Anesthesia Post Note  Patient: [name]  Procedure(s) Performed: COLONOSCOPY WITH PROPOFOL (N/A ) ESOPHAGOGASTRODUODENOSCOPY (EGD) WITH PROPOFOL (N/A )  Patient location during evaluation: Endoscopy Anesthesia Type: General Level of consciousness: awake and alert Pain management: pain level controlled Vital Signs Assessment: post-procedure vital signs reviewed and stable Respiratory status: spontaneous breathing, nonlabored ventilation, respiratory function stable and patient connected to nasal cannula oxygen Cardiovascular status: blood pressure returned to baseline and stable Postop Assessment: no apparent nausea or vomiting Anesthetic complications: no     Last Vitals:  Vitals:   01/16/18 1211 01/16/18 1221  BP: 110/68 (!) 116/56  Pulse: 63 64  Resp: (!) 28 15  Temp:    SpO2: 100% 100%    Last Pain:  Vitals:   01/16/18 1221  TempSrc:   PainSc: 0-No pain                 Precious Haws Birt Reinoso

## 2018-01-17 LAB — SURGICAL PATHOLOGY

## 2018-01-18 DIAGNOSIS — Z981 Arthrodesis status: Secondary | ICD-10-CM | POA: Diagnosis not present

## 2018-01-18 DIAGNOSIS — Z4789 Encounter for other orthopedic aftercare: Secondary | ICD-10-CM | POA: Diagnosis not present

## 2018-01-18 DIAGNOSIS — M25341 Other instability, right hand: Secondary | ICD-10-CM | POA: Diagnosis not present

## 2018-01-25 DIAGNOSIS — Z6841 Body Mass Index (BMI) 40.0 and over, adult: Secondary | ICD-10-CM | POA: Diagnosis not present

## 2018-01-25 DIAGNOSIS — E669 Obesity, unspecified: Secondary | ICD-10-CM | POA: Diagnosis not present

## 2018-01-25 DIAGNOSIS — Z1331 Encounter for screening for depression: Secondary | ICD-10-CM | POA: Diagnosis not present

## 2018-01-25 DIAGNOSIS — Z1239 Encounter for other screening for malignant neoplasm of breast: Secondary | ICD-10-CM | POA: Diagnosis not present

## 2018-01-25 DIAGNOSIS — Z136 Encounter for screening for cardiovascular disorders: Secondary | ICD-10-CM | POA: Diagnosis not present

## 2018-01-25 DIAGNOSIS — E785 Hyperlipidemia, unspecified: Secondary | ICD-10-CM | POA: Diagnosis not present

## 2018-01-25 DIAGNOSIS — Z0000 Encounter for general adult medical examination without abnormal findings: Secondary | ICD-10-CM | POA: Diagnosis not present

## 2018-01-31 ENCOUNTER — Other Ambulatory Visit: Payer: Self-pay | Admitting: Nurse Practitioner

## 2018-01-31 DIAGNOSIS — I10 Essential (primary) hypertension: Secondary | ICD-10-CM | POA: Diagnosis not present

## 2018-01-31 DIAGNOSIS — J309 Allergic rhinitis, unspecified: Secondary | ICD-10-CM | POA: Diagnosis not present

## 2018-01-31 DIAGNOSIS — Z23 Encounter for immunization: Secondary | ICD-10-CM | POA: Diagnosis not present

## 2018-01-31 DIAGNOSIS — K219 Gastro-esophageal reflux disease without esophagitis: Secondary | ICD-10-CM | POA: Diagnosis not present

## 2018-01-31 DIAGNOSIS — F339 Major depressive disorder, recurrent, unspecified: Secondary | ICD-10-CM | POA: Diagnosis not present

## 2018-01-31 DIAGNOSIS — D509 Iron deficiency anemia, unspecified: Secondary | ICD-10-CM | POA: Diagnosis not present

## 2018-01-31 DIAGNOSIS — I4581 Long QT syndrome: Secondary | ICD-10-CM | POA: Diagnosis not present

## 2018-01-31 DIAGNOSIS — Z1231 Encounter for screening mammogram for malignant neoplasm of breast: Secondary | ICD-10-CM

## 2018-01-31 DIAGNOSIS — Z136 Encounter for screening for cardiovascular disorders: Secondary | ICD-10-CM | POA: Diagnosis not present

## 2018-02-08 DIAGNOSIS — M25341 Other instability, right hand: Secondary | ICD-10-CM | POA: Diagnosis not present

## 2018-02-08 DIAGNOSIS — Z981 Arthrodesis status: Secondary | ICD-10-CM | POA: Diagnosis not present

## 2018-02-13 ENCOUNTER — Ambulatory Visit
Admission: RE | Admit: 2018-02-13 | Discharge: 2018-02-13 | Disposition: A | Discharge status: Home / Self Care | Payer: Medicare Other | Source: Ambulatory Visit | Attending: Nurse Practitioner | Admitting: Nurse Practitioner

## 2018-02-13 DIAGNOSIS — K2960 Other gastritis without bleeding: Secondary | ICD-10-CM | POA: Diagnosis not present

## 2018-02-13 DIAGNOSIS — K5904 Chronic idiopathic constipation: Secondary | ICD-10-CM | POA: Diagnosis not present

## 2018-02-13 DIAGNOSIS — Z1231 Encounter for screening mammogram for malignant neoplasm of breast: Secondary | ICD-10-CM | POA: Diagnosis not present

## 2018-02-13 DIAGNOSIS — K210 Gastro-esophageal reflux disease with esophagitis: Secondary | ICD-10-CM | POA: Diagnosis not present

## 2018-02-19 DIAGNOSIS — M76821 Posterior tibial tendinitis, right leg: Secondary | ICD-10-CM | POA: Diagnosis not present

## 2018-03-19 DIAGNOSIS — F329 Major depressive disorder, single episode, unspecified: Secondary | ICD-10-CM | POA: Diagnosis not present

## 2018-03-19 DIAGNOSIS — Z7982 Long term (current) use of aspirin: Secondary | ICD-10-CM | POA: Diagnosis not present

## 2018-03-19 DIAGNOSIS — M6281 Muscle weakness (generalized): Secondary | ICD-10-CM | POA: Diagnosis not present

## 2018-03-19 DIAGNOSIS — F419 Anxiety disorder, unspecified: Secondary | ICD-10-CM | POA: Diagnosis not present

## 2018-03-19 DIAGNOSIS — Z86711 Personal history of pulmonary embolism: Secondary | ICD-10-CM | POA: Diagnosis not present

## 2018-03-19 DIAGNOSIS — Z791 Long term (current) use of non-steroidal anti-inflammatories (NSAID): Secondary | ICD-10-CM | POA: Diagnosis not present

## 2018-03-19 DIAGNOSIS — D649 Anemia, unspecified: Secondary | ICD-10-CM | POA: Diagnosis not present

## 2018-03-19 DIAGNOSIS — I10 Essential (primary) hypertension: Secondary | ICD-10-CM | POA: Diagnosis not present

## 2018-03-19 DIAGNOSIS — M25341 Other instability, right hand: Secondary | ICD-10-CM | POA: Diagnosis not present

## 2018-03-19 DIAGNOSIS — Z79899 Other long term (current) drug therapy: Secondary | ICD-10-CM | POA: Diagnosis not present

## 2018-03-19 DIAGNOSIS — Z981 Arthrodesis status: Secondary | ICD-10-CM | POA: Diagnosis not present

## 2018-03-19 DIAGNOSIS — M810 Age-related osteoporosis without current pathological fracture: Secondary | ICD-10-CM | POA: Diagnosis not present

## 2018-03-19 DIAGNOSIS — I739 Peripheral vascular disease, unspecified: Secondary | ICD-10-CM | POA: Diagnosis not present

## 2018-03-19 DIAGNOSIS — Z86718 Personal history of other venous thrombosis and embolism: Secondary | ICD-10-CM | POA: Diagnosis not present

## 2018-03-19 DIAGNOSIS — K219 Gastro-esophageal reflux disease without esophagitis: Secondary | ICD-10-CM | POA: Diagnosis not present

## 2018-03-19 DIAGNOSIS — Z7951 Long term (current) use of inhaled steroids: Secondary | ICD-10-CM | POA: Diagnosis not present

## 2018-03-19 DIAGNOSIS — M1991 Primary osteoarthritis, unspecified site: Secondary | ICD-10-CM | POA: Diagnosis not present

## 2018-04-30 DIAGNOSIS — Z981 Arthrodesis status: Secondary | ICD-10-CM | POA: Diagnosis not present

## 2018-04-30 DIAGNOSIS — M25341 Other instability, right hand: Secondary | ICD-10-CM | POA: Diagnosis not present

## 2018-05-09 DIAGNOSIS — M25852 Other specified joint disorders, left hip: Secondary | ICD-10-CM | POA: Diagnosis not present

## 2018-05-09 DIAGNOSIS — M1612 Unilateral primary osteoarthritis, left hip: Secondary | ICD-10-CM | POA: Diagnosis not present

## 2018-05-09 DIAGNOSIS — M5416 Radiculopathy, lumbar region: Secondary | ICD-10-CM | POA: Diagnosis not present

## 2018-05-10 DIAGNOSIS — M25341 Other instability, right hand: Secondary | ICD-10-CM | POA: Diagnosis not present

## 2018-05-10 DIAGNOSIS — Z981 Arthrodesis status: Secondary | ICD-10-CM | POA: Diagnosis not present

## 2018-05-12 DIAGNOSIS — G8911 Acute pain due to trauma: Secondary | ICD-10-CM | POA: Diagnosis not present

## 2018-05-12 DIAGNOSIS — S3992XA Unspecified injury of lower back, initial encounter: Secondary | ICD-10-CM | POA: Diagnosis not present

## 2018-05-12 DIAGNOSIS — I739 Peripheral vascular disease, unspecified: Secondary | ICD-10-CM | POA: Diagnosis not present

## 2018-05-12 DIAGNOSIS — T148XXA Other injury of unspecified body region, initial encounter: Secondary | ICD-10-CM | POA: Diagnosis not present

## 2018-05-12 DIAGNOSIS — M47816 Spondylosis without myelopathy or radiculopathy, lumbar region: Secondary | ICD-10-CM | POA: Diagnosis not present

## 2018-05-12 DIAGNOSIS — I10 Essential (primary) hypertension: Secondary | ICD-10-CM | POA: Diagnosis not present

## 2018-05-12 DIAGNOSIS — M5489 Other dorsalgia: Secondary | ICD-10-CM | POA: Diagnosis not present

## 2018-05-12 DIAGNOSIS — M79605 Pain in left leg: Secondary | ICD-10-CM | POA: Diagnosis not present

## 2018-05-12 DIAGNOSIS — M25552 Pain in left hip: Secondary | ICD-10-CM | POA: Diagnosis not present

## 2018-05-12 DIAGNOSIS — M25562 Pain in left knee: Secondary | ICD-10-CM | POA: Diagnosis not present

## 2018-05-12 DIAGNOSIS — Z96641 Presence of right artificial hip joint: Secondary | ICD-10-CM | POA: Diagnosis not present

## 2018-05-12 DIAGNOSIS — S79912A Unspecified injury of left hip, initial encounter: Secondary | ICD-10-CM | POA: Diagnosis not present

## 2018-05-12 DIAGNOSIS — M545 Low back pain: Secondary | ICD-10-CM | POA: Diagnosis not present

## 2018-05-12 DIAGNOSIS — M549 Dorsalgia, unspecified: Secondary | ICD-10-CM | POA: Diagnosis not present

## 2018-05-12 DIAGNOSIS — Z86718 Personal history of other venous thrombosis and embolism: Secondary | ICD-10-CM | POA: Diagnosis not present

## 2018-05-12 DIAGNOSIS — S8992XA Unspecified injury of left lower leg, initial encounter: Secondary | ICD-10-CM | POA: Diagnosis not present

## 2018-05-12 DIAGNOSIS — Z981 Arthrodesis status: Secondary | ICD-10-CM | POA: Diagnosis not present

## 2018-08-03 DIAGNOSIS — E319 Polyglandular dysfunction, unspecified: Secondary | ICD-10-CM | POA: Diagnosis not present

## 2018-08-03 DIAGNOSIS — N926 Irregular menstruation, unspecified: Secondary | ICD-10-CM | POA: Diagnosis not present

## 2018-08-03 DIAGNOSIS — N950 Postmenopausal bleeding: Secondary | ICD-10-CM | POA: Diagnosis not present

## 2018-08-03 DIAGNOSIS — Z6841 Body Mass Index (BMI) 40.0 and over, adult: Secondary | ICD-10-CM | POA: Diagnosis not present

## 2018-08-03 DIAGNOSIS — R319 Hematuria, unspecified: Secondary | ICD-10-CM | POA: Diagnosis not present

## 2018-08-13 ENCOUNTER — Telehealth: Payer: Self-pay

## 2018-08-13 NOTE — Telephone Encounter (Signed)
Pt called and went over COVID-19 symptoms with pt. Pt stated that she was not having symptoms of COVID-19. Pt is also aware of the no visitors policy.

## 2018-08-14 ENCOUNTER — Encounter: Payer: Medicare Other | Admitting: Obstetrics and Gynecology

## 2018-08-17 ENCOUNTER — Telehealth: Payer: Self-pay

## 2018-08-17 NOTE — Telephone Encounter (Signed)
Pt prescreened no sx of COVID-19 pt is aware of the no visitors policy.

## 2018-08-20 ENCOUNTER — Encounter: Payer: Medicare Other | Admitting: Obstetrics and Gynecology

## 2018-08-22 ENCOUNTER — Ambulatory Visit (INDEPENDENT_AMBULATORY_CARE_PROVIDER_SITE_OTHER): Payer: Medicare Other | Admitting: Obstetrics and Gynecology

## 2018-08-22 ENCOUNTER — Other Ambulatory Visit: Payer: Self-pay

## 2018-08-22 ENCOUNTER — Encounter: Payer: Self-pay | Admitting: Obstetrics and Gynecology

## 2018-08-22 VITALS — BP 131/85 | HR 83 | Ht 64.0 in | Wt 294.2 lb

## 2018-08-22 DIAGNOSIS — N950 Postmenopausal bleeding: Secondary | ICD-10-CM | POA: Diagnosis not present

## 2018-08-22 NOTE — Progress Notes (Signed)
HPI:      [name] is a 53 y.o. No obstetric history on file. who LMP was No LMP recorded. (Menstrual status: Perimenopausal).  Subjective:   She presents today stating that she has not had vaginal bleeding in more than a year but began passing some dark blood and then a large clot.  She states that her bleeding has resolved at this time.  3 years ago she was having issues with menorrhagia and had an endometrial biopsy which proved to be negative for hyperplasia or malignancy.  She has been delinquent in her GYN care because she has had multiple other medical issues in the last few years.     Hx: The following portions of the patient's history were reviewed and updated as appropriate:             She  has a past medical history of Anemia, Anxiety, Arthritis, Atypical chest pain, Benign neoplasm of colon, Depression, DVT (deep venous thrombosis) ([place]), Dyspnea, GERD (gastroesophageal reflux disease), HTN (hypertension), Long Q-T syndrome, Obesity, OSA (obstructive sleep apnea), PE (pulmonary embolism), Peripheral vascular disease ([place]), Prolonged QT interval, and Seizures ([place]). She does not have any pertinent problems on file. She  has a past surgical history that includes Total hip arthroplasty; Lumbar disc surgery; left foot surgery; left hand surgery x 2; right hand surgery for torn ligament; Colonoscopy; Cardiac catheterization; Dilation and curettage of uterus; Anterior cervical decomp/discectomy fusion (N/A, 06/25/2013); Esophagogastroduodenoscopy (egd) with propofol (N/A, 11/17/2015); Anterior cervical decomp/discectomy fusion (N/A, 03/29/2016); Joint replacement (04/2006); Back surgery; Colonoscopy with propofol (N/A, 01/16/2018); and Esophagogastroduodenoscopy (egd) with propofol (N/A, 01/16/2018). Her family history includes Breast cancer in her paternal aunt; Breast cancer (age of onset: 17) in her maternal aunt; Breast cancer (age of onset: 74) in her maternal aunt; CAD in her  father and mother; Diabetes in her brother and father; Long QT syndrome in her mother. She  reports that she quit smoking about 17 years ago. She has never used smokeless tobacco. She reports current alcohol use of about 2.0 standard drinks of alcohol per week. She reports previous drug use. She has a current medication list which includes the following prescription(s): albuterol, budesonide-formoterol, cyclobenzaprine, escitalopram, esomeprazole, ferrous sulfate, fluocinonide, glucosamine 1500 complex, hyoscyamine, labetalol, loratadine, multivitamin with minerals, nabumetone, fish oil, potassium chloride sa, spironolactone, spironolactone-hydrochlorothiazide, triamterene-hydrochlorothiazide, and zolpidem. She has No Known Allergies.       Review of Systems:  Review of Systems  Constitutional: Denied constitutional symptoms, night sweats, recent illness, fatigue, fever, insomnia and weight loss.  Eyes: Denied eye symptoms, eye pain, photophobia, vision change and visual disturbance.  Ears/Nose/Throat/Neck: Denied ear, nose, throat or neck symptoms, hearing loss, nasal discharge, sinus congestion and sore throat.  Cardiovascular: Denied cardiovascular symptoms, arrhythmia, chest pain/pressure, edema, exercise intolerance, orthopnea and palpitations.  Respiratory: Denied pulmonary symptoms, asthma, pleuritic pain, productive sputum, cough, dyspnea and wheezing.  Gastrointestinal: Denied, gastro-esophageal reflux, melena, nausea and vomiting.  Genitourinary: See HPI for additional information.  Musculoskeletal: Denied musculoskeletal symptoms, stiffness, swelling, muscle weakness and myalgia.  Dermatologic: Denied dermatology symptoms, rash and scar.  Neurologic: Denied neurology symptoms, dizziness, headache, neck pain and syncope.  Psychiatric: Denied psychiatric symptoms, anxiety and depression.  Endocrine: Denied endocrine symptoms including hot flashes and night sweats.   Meds:   Current  Outpatient Medications on File Prior to Visit  Medication Sig Dispense Refill  . albuterol (PROVENTIL HFA;VENTOLIN HFA) 108 (90 BASE) MCG/ACT inhaler Inhale 1-2 puffs into the lungs every 4 (four) hours  as needed for wheezing or shortness of breath.    . budesonide-formoterol (SYMBICORT) 160-4.5 MCG/ACT inhaler Inhale 2 puffs into the lungs 2 (two) times daily as needed (for shortness of breath).    . cyclobenzaprine (FLEXERIL) 10 MG tablet Take 0.5 tablets (5 mg total) by mouth 3 (three) times daily as needed for muscle spasms. 20 tablet 0  . escitalopram (LEXAPRO) 20 MG tablet Take 20 mg by mouth daily.     Marland Kitchen esomeprazole (NEXIUM) 40 MG capsule Take 1 capsule (40 mg total) by mouth at bedtime. 10 capsule 0  . Ferrous Sulfate (SLOW RELEASE IRON PO) Take 142 mg by mouth daily.     . fluocinonide (LIDEX) 0.05 % external solution Apply 1 application topically 2 (two) times daily.    . Glucosamine-Chondroit-Vit C-Mn (GLUCOSAMINE 1500 COMPLEX) CAPS Take 1 each by mouth daily.    . hyoscyamine (LEVSIN, ANASPAZ) 0.125 MG tablet Take 0.125 mg by mouth daily.    Marland Kitchen labetalol (NORMODYNE) 200 MG tablet Take 1 tablet (200 mg total) by mouth 2 (two) times daily. 180 tablet 1  . loratadine (CLARITIN) 10 MG tablet Take 10 mg by mouth daily.    . Multiple Vitamins-Minerals (MULTIVITAMIN WITH MINERALS) tablet Take 1 tablet by mouth daily.    . nabumetone (RELAFEN) 750 MG tablet Take 1 tablet (750 mg total) by mouth 2 (two) times daily. 60 tablet 5  . Omega-3 Fatty Acids (FISH OIL) 1000 MG CAPS Take 1 capsule by mouth 3 (three) times daily.      . potassium chloride SA (K-DUR,KLOR-CON) 20 MEQ tablet Take 1 tablet (20 mEq total) by mouth daily. 30 tablet 5  . spironolactone (ALDACTONE) 25 MG tablet Take 1 tablet (25 mg total) by mouth daily. 30 tablet 5  . spironolactone-hydrochlorothiazide (ALDACTAZIDE) 25-25 MG tablet Take 1 tablet by mouth every morning.     . triamterene-hydrochlorothiazide (MAXZIDE) 75-50 MG  tablet Take 1 tablet by mouth every evening.     . zolpidem (AMBIEN) 10 MG tablet Take 10 mg by mouth at bedtime as needed for sleep.     No current facility-administered medications on file prior to visit.     Objective:     Vitals:   08/22/18 0925  BP: 131/85  Pulse: 83              Physical examination   Pelvic:   Vulva: Normal appearance.  No lesions.  Vagina: No lesions or abnormalities noted.  Support: Normal pelvic support.  Urethra No masses tenderness or scarring.  Meatus Normal size without lesions or prolapse.  Cervix: Normal appearance.  No lesions.  Anus: Normal exam.  No lesions.  Perineum: Normal exam.  No lesions.        Bimanual   Uterus: Normal size.  Non-tender.  Mobile.  AV.  Adnexae: No masses.  Non-tender to palpation.  Cul-de-sac: Negative for abnormality.   Exam limited by patient body habitus  Assessment:    No obstetric history on file. Patient Active Problem List   Diagnosis Date Noted  . OSA on CPAP 04/05/2016  . Status post cervical spinal fusion 04/05/2016  . Pulmonary embolus ([place]) 04/04/2016  . Cervical spondylosis with radiculopathy 03/29/2016  . Difficulty hearing 10/05/2014  . 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 Deep vein thrombosis of lower extremity ([place]) 10/05/2014  . Edema extremities 10/05/2014  . Esophagitis, reflux 10/05/2014  . Degenerative arthritis of hip 10/05/2014  . Depression, major, single episode, in partial remission ([place]) 10/05/2014  .  Pulmonary infarction ([place]) 10/05/2014  . Idiopathic insomnia 10/05/2014  . Cervical stenosis of spinal canal 06/25/2013  . Postural dizziness 06/22/2012  . Essential hypertension 06/22/2012  . Morbid obesity ([place]) 08/17/2010  . UNSPECIFIED ENDOCRINE DISORDER 05/15/2009  . CHEST PAIN UNSPECIFIED 01/08/2009  . DEPRESSION 08/21/2008  . OSTEOPOROSIS 08/21/2008  . LONG QT SYNDROME 08/21/2008     1. Postmenopausal bleeding   2. Morbid obesity ([place])     Doubt endometrial cancer  as she has had a relatively recent endometrial biopsy. No evidence of cervical issues or endocervical polyp   Plan:            1.  Ultrasound for endometrial thickness-consider endometrial biopsy if increased for menopause.  2.  Patient delinquent in routine maintenance and annual examination with Pap/mammogram.  Patient to return for this once COVID-19 resolves. Orders Orders Placed This Encounter  Procedures  . US PELVIS TRANSVANGINAL NON-OB (TV ONLY)    No orders of the defined types were placed in this encounter.     F/U  Return for We will contact her with any abnormal test results. I spent 23 minutes involved in the care of this patient of which greater than 50% was spent discussing postmenopausal bleeding, recent history of surgeries, previous endometrial biopsy, use of ultrasound for diagnosis, signs and symptoms of menopause.  All questions answered. Finis Bud, M.D. 08/22/2018 10:02 AM

## 2018-08-22 NOTE — Progress Notes (Signed)
Patient comes in today for GYN visit. She has not had a cycle in over a year. She had passed some blood that looked dried up and then passed a blood clot. She has noticed the blood when she whips.

## 2018-08-23 ENCOUNTER — Other Ambulatory Visit: Payer: Medicare Other

## 2018-08-27 ENCOUNTER — Telehealth: Payer: Self-pay

## 2018-08-27 NOTE — Telephone Encounter (Signed)
Coronavirus (COVID-19) Are you at risk?  Are you at risk for the Coronavirus (COVID-19)?  To be considered HIGH RISK for Coronavirus (COVID-19), you have to meet the following criteria:  . Traveled to [place], [place], [place], [place] or [place]; or in the [place] to [place], [place], [place], or [place]; and have fever, cough, and shortness of breath within the last 2 weeks of travel OR . Been in close contact with a person diagnosed with COVID-19 within the last 2 weeks and have fever, cough, and shortness of breath . IF YOU DO NOT MEET THESE CRITERIA, YOU ARE CONSIDERED LOW RISK FOR COVID-19.  What to do if you are HIGH RISK for COVID-19?  . If you are having a medical emergency, call 911. . Seek medical care right away. Before you go to a doctor's office, urgent care or emergency department, call ahead and tell them about your recent travel, contact with someone diagnosed with COVID-19, and your symptoms. You should receive instructions from your physician's office regarding next steps of care.  . When you arrive at healthcare provider, tell the healthcare staff immediately you have returned from visiting [place], [place], [place], [place] or [place]; or traveled in the [place] to [place], [place], [place], or [place]; in the last two weeks or you have been in close contact with a person diagnosed with COVID-19 in the last 2 weeks.   . Tell the health care staff about your symptoms: fever, cough and shortness of breath. . After you have been seen by a medical provider, you will be either: o Tested for (COVID-19) and discharged home on quarantine except to seek medical care if symptoms worsen, and asked to  - Stay home and avoid contact with others until you get your results (4-5 days)  - Avoid travel on public transportation if possible (such as bus, train, or airplane) or o Sent to the Emergency Department by EMS for evaluation, COVID-19 testing, and possible  admission depending on your condition and test results.  What to do if you are LOW RISK for COVID-19?  Reduce your risk of any infection by using the same precautions used for avoiding the common cold or flu:  . Wash your hands often with soap and warm water for at least 20 seconds.  If soap and water are not readily available, use an alcohol-based hand sanitizer with at least 60% alcohol.  . If coughing or sneezing, cover your mouth and nose by coughing or sneezing into the elbow areas of your shirt or coat, into a tissue or into your sleeve (not your hands). . Avoid shaking hands with others and consider head nods or verbal greetings only. . Avoid touching your eyes, nose, or mouth with unwashed hands.  . Avoid close contact with people who are sick. . Avoid places or events with large numbers of people in one location, like concerts or sporting events. . Carefully consider travel plans you have or are making. . If you are planning any travel outside or inside the [place], visit the CDC's Travelers' Health webpage for the latest health notices. . If you have some symptoms but not all symptoms, continue to monitor at home and seek medical attention if your symptoms worsen. . If you are having a medical emergency, call 911.   ADDITIONAL HEALTHCARE OPTIONS FOR PATIENTS  [facility] / e-Visit: [email or web address]         [facility]: [phone]  [facility]: [phone]                   [facility]: [phone]   Prescreened. Neg .cm 

## 2018-08-28 ENCOUNTER — Other Ambulatory Visit: Payer: Self-pay

## 2018-08-28 ENCOUNTER — Ambulatory Visit (INDEPENDENT_AMBULATORY_CARE_PROVIDER_SITE_OTHER): Payer: Medicare Other

## 2018-08-28 DIAGNOSIS — D251 Intramural leiomyoma of uterus: Secondary | ICD-10-CM

## 2018-08-28 DIAGNOSIS — N83202 Unspecified ovarian cyst, left side: Secondary | ICD-10-CM

## 2018-08-28 DIAGNOSIS — N950 Postmenopausal bleeding: Secondary | ICD-10-CM

## 2018-09-12 ENCOUNTER — Telehealth: Payer: Self-pay

## 2018-09-12 NOTE — Telephone Encounter (Signed)
Coronavirus (COVID-19) Are you at risk?  Are you at risk for the Coronavirus (COVID-19)?  To be considered HIGH RISK for Coronavirus (COVID-19), you have to meet the following criteria:  . Traveled to [place], [place], [place], [place] or [place]; or in the [place] to [place], [place], [place], or [place]; and have fever, cough, and shortness of breath within the last 2 weeks of travel OR . Been in close contact with a person diagnosed with COVID-19 within the last 2 weeks and have fever, cough, and shortness of breath . IF YOU DO NOT MEET THESE CRITERIA, YOU ARE CONSIDERED LOW RISK FOR COVID-19.  What to do if you are HIGH RISK for COVID-19?  . If you are having a medical emergency, call 911. . Seek medical care right away. Before you go to a doctor's office, urgent care or emergency department, call ahead and tell them about your recent travel, contact with someone diagnosed with COVID-19, and your symptoms. You should receive instructions from your physician's office regarding next steps of care.  . When you arrive at healthcare provider, tell the healthcare staff immediately you have returned from visiting [place], [place], [place], [place] or [place]; or traveled in the [place] to [place], [place], [place], or [place]; in the last two weeks or you have been in close contact with a person diagnosed with COVID-19 in the last 2 weeks.   . Tell the health care staff about your symptoms: fever, cough and shortness of breath. . After you have been seen by a medical provider, you will be either: o Tested for (COVID-19) and discharged home on quarantine except to seek medical care if symptoms worsen, and asked to  - Stay home and avoid contact with others until you get your results (4-5 days)  - Avoid travel on public transportation if possible (such as bus, train, or airplane) or o Sent to the Emergency Department by EMS for evaluation, COVID-19 testing, and possible  admission depending on your condition and test results.  What to do if you are LOW RISK for COVID-19?  Reduce your risk of any infection by using the same precautions used for avoiding the common cold or flu:  . Wash your hands often with soap and warm water for at least 20 seconds.  If soap and water are not readily available, use an alcohol-based hand sanitizer with at least 60% alcohol.  . If coughing or sneezing, cover your mouth and nose by coughing or sneezing into the elbow areas of your shirt or coat, into a tissue or into your sleeve (not your hands). . Avoid shaking hands with others and consider head nods or verbal greetings only. . Avoid touching your eyes, nose, or mouth with unwashed hands.  . Avoid close contact with people who are sick. . Avoid places or events with large numbers of people in one location, like concerts or sporting events. . Carefully consider travel plans you have or are making. . If you are planning any travel outside or inside the [place], visit the CDC's Travelers' Health webpage for the latest health notices. . If you have some symptoms but not all symptoms, continue to monitor at home and seek medical attention if your symptoms worsen. . If you are having a medical emergency, call 911.   ADDITIONAL HEALTHCARE OPTIONS FOR PATIENTS  [place] / e-Visit: [email or web address]         [facility]: [phone]  [place]   [facility]: [phone]                   [facility]: [phone]   

## 2018-09-13 ENCOUNTER — Other Ambulatory Visit: Payer: Self-pay

## 2018-09-13 ENCOUNTER — Ambulatory Visit (INDEPENDENT_AMBULATORY_CARE_PROVIDER_SITE_OTHER): Payer: Medicare Other | Admitting: Obstetrics and Gynecology

## 2018-09-13 ENCOUNTER — Other Ambulatory Visit (HOSPITAL_COMMUNITY)
Admission: RE | Admit: 2018-09-13 | Discharge: 2018-09-13 | Disposition: A | Discharge status: Home / Self Care | Payer: Medicare Other | Source: Ambulatory Visit | Attending: Obstetrics and Gynecology | Admitting: Obstetrics and Gynecology

## 2018-09-13 ENCOUNTER — Encounter: Payer: Self-pay | Admitting: Obstetrics and Gynecology

## 2018-09-13 VITALS — BP 139/88 | HR 83 | Ht 64.0 in | Wt 301.4 lb

## 2018-09-13 DIAGNOSIS — R9389 Abnormal findings on diagnostic imaging of other specified body structures: Secondary | ICD-10-CM

## 2018-09-13 DIAGNOSIS — N950 Postmenopausal bleeding: Secondary | ICD-10-CM | POA: Diagnosis not present

## 2018-09-13 NOTE — Addendum Note (Signed)
Addended by: Durwin Glaze on: 09/13/2018 04:06 PM   Modules accepted: Orders

## 2018-09-13 NOTE — Progress Notes (Signed)
Patient comes in today for endometrium biopsy.

## 2018-09-13 NOTE — Progress Notes (Signed)
HPI:      [name] is a 53 y.o. No obstetric history on file. who LMP was No LMP recorded. (Menstrual status: Perimenopausal).  Subjective:   She presents today patient has been having small amounts of irregular vaginal bleeding.  Ultrasound revealed small uterine fibroids but thickened endometrium.  She presents today for endometrial biopsy.    Hx: The following portions of the patient's history were reviewed and updated as appropriate:             She  has a past medical history of Anemia, Anxiety, Arthritis, Atypical chest pain, Benign neoplasm of colon, Depression, DVT (deep venous thrombosis) ([place]), Dyspnea, GERD (gastroesophageal reflux disease), HTN (hypertension), Long Q-T syndrome, Obesity, OSA (obstructive sleep apnea), PE (pulmonary embolism), Peripheral vascular disease ([place]), Prolonged QT interval, and Seizures ([place]). She does not have any pertinent problems on file. She  has a past surgical history that includes Total hip arthroplasty; Lumbar disc surgery; left foot surgery; left hand surgery x 2; right hand surgery for torn ligament; Colonoscopy; Cardiac catheterization; Dilation and curettage of uterus; Anterior cervical decomp/discectomy fusion (N/A, 06/25/2013); Esophagogastroduodenoscopy (egd) with propofol (N/A, 11/17/2015); Anterior cervical decomp/discectomy fusion (N/A, 03/29/2016); Joint replacement (04/2006); Back surgery; Colonoscopy with propofol (N/A, 01/16/2018); and Esophagogastroduodenoscopy (egd) with propofol (N/A, 01/16/2018). Her family history includes Breast cancer in her paternal aunt; Breast cancer (age of onset: 53) in her maternal aunt; Breast cancer (age of onset: 5) in her maternal aunt; CAD in her father and mother; Diabetes in her brother and father; Long QT syndrome in her mother. She  reports that she quit smoking about 17 years ago. She has never used smokeless tobacco. She reports current alcohol use of about 2.0 standard drinks of alcohol per  week. She reports previous drug use. She has a current medication list which includes the following prescription(s): albuterol, budesonide-formoterol, cyclobenzaprine, escitalopram, esomeprazole, ferrous sulfate, fluocinonide, glucosamine 1500 complex, hyoscyamine, labetalol, loratadine, multivitamin with minerals, nabumetone, fish oil, potassium chloride sa, spironolactone, spironolactone-hydrochlorothiazide, triamterene-hydrochlorothiazide, and zolpidem. She has No Known Allergies.       Review of Systems:  Review of Systems  Constitutional: Denied constitutional symptoms, night sweats, recent illness, fatigue, fever, insomnia and weight loss.  Eyes: Denied eye symptoms, eye pain, photophobia, vision change and visual disturbance.  Ears/Nose/Throat/Neck: Denied ear, nose, throat or neck symptoms, hearing loss, nasal discharge, sinus congestion and sore throat.  Cardiovascular: Denied cardiovascular symptoms, arrhythmia, chest pain/pressure, edema, exercise intolerance, orthopnea and palpitations.  Respiratory: Denied pulmonary symptoms, asthma, pleuritic pain, productive sputum, cough, dyspnea and wheezing.  Gastrointestinal: Denied, gastro-esophageal reflux, melena, nausea and vomiting.  Genitourinary: See HPI for additional information.  Musculoskeletal: Denied musculoskeletal symptoms, stiffness, swelling, muscle weakness and myalgia.  Dermatologic: Denied dermatology symptoms, rash and scar.  Neurologic: Denied neurology symptoms, dizziness, headache, neck pain and syncope.  Psychiatric: Denied psychiatric symptoms, anxiety and depression.  Endocrine: Denied endocrine symptoms including hot flashes and night sweats.   Meds:   Current Outpatient Medications on File Prior to Visit  Medication Sig Dispense Refill  . albuterol (PROVENTIL HFA;VENTOLIN HFA) 108 (90 BASE) MCG/ACT inhaler Inhale 1-2 puffs into the lungs every 4 (four) hours as needed for wheezing or shortness of breath.    .  budesonide-formoterol (SYMBICORT) 160-4.5 MCG/ACT inhaler Inhale 2 puffs into the lungs 2 (two) times daily as needed (for shortness of breath).    . cyclobenzaprine (FLEXERIL) 10 MG tablet Take 0.5 tablets (5 mg total) by mouth 3 (three) times daily as needed  for muscle spasms. 20 tablet 0  . escitalopram (LEXAPRO) 20 MG tablet Take 20 mg by mouth daily.     Marland Kitchen esomeprazole (NEXIUM) 40 MG capsule Take 1 capsule (40 mg total) by mouth at bedtime. 10 capsule 0  . Ferrous Sulfate (SLOW RELEASE IRON PO) Take 142 mg by mouth daily.     . fluocinonide (LIDEX) 0.05 % external solution Apply 1 application topically 2 (two) times daily.    . Glucosamine-Chondroit-Vit C-Mn (GLUCOSAMINE 1500 COMPLEX) CAPS Take 1 each by mouth daily.    . hyoscyamine (LEVSIN, ANASPAZ) 0.125 MG tablet Take 0.125 mg by mouth daily.    Marland Kitchen labetalol (NORMODYNE) 200 MG tablet Take 1 tablet (200 mg total) by mouth 2 (two) times daily. 180 tablet 1  . loratadine (CLARITIN) 10 MG tablet Take 10 mg by mouth daily.    . Multiple Vitamins-Minerals (MULTIVITAMIN WITH MINERALS) tablet Take 1 tablet by mouth daily.    . nabumetone (RELAFEN) 750 MG tablet Take 1 tablet (750 mg total) by mouth 2 (two) times daily. 60 tablet 5  . Omega-3 Fatty Acids (FISH OIL) 1000 MG CAPS Take 1 capsule by mouth 3 (three) times daily.      . potassium chloride SA (K-DUR,KLOR-CON) 20 MEQ tablet Take 1 tablet (20 mEq total) by mouth daily. 30 tablet 5  . spironolactone (ALDACTONE) 25 MG tablet Take 1 tablet (25 mg total) by mouth daily. 30 tablet 5  . spironolactone-hydrochlorothiazide (ALDACTAZIDE) 25-25 MG tablet Take 1 tablet by mouth every morning.     . triamterene-hydrochlorothiazide (MAXZIDE) 75-50 MG tablet Take 1 tablet by mouth every evening.     . zolpidem (AMBIEN) 10 MG tablet Take 10 mg by mouth at bedtime as needed for sleep.     No current facility-administered medications on file prior to visit.     Objective:     Vitals:   09/13/18 1450   BP: 139/88  Pulse: 83              Physical examination   Pelvic:   Vulva: Normal appearance.  No lesions.  Vagina: No lesions or abnormalities noted.  Support: Normal pelvic support.  Urethra No masses tenderness or scarring.  Meatus Normal size without lesions or prolapse.  Cervix: Normal appearance.  No lesions.  Anus: Normal exam.  No lesions.  Perineum: Normal exam.  No lesions.        Bimanual   Uterus: Normal size.  Non-tender.  Mobile.  AV.  Adnexae: No masses.  Non-tender to palpation.  Cul-de-sac: Negative for abnormality.   Pelvic exam extremely limited by patient body habitus.  Endometrial Biopsy After discussion with the patient regarding her abnormal uterine bleeding I recommended that she proceed with an endometrial biopsy for further diagnosis. The risks, benefits, alternatives, and indications for an endometrial biopsy were discussed with the patient in detail. She understood the risks including infection, bleeding, cervical laceration and uterine perforation.  Verbal consent was obtained.   PROCEDURE NOTE:  Vacurette endometrial biopsy was performed using aseptic technique with iodine preparation.  The uterus was sounded to a length of 8 cm.  Adequate sampling was obtained with minimal blood loss.  The patient tolerated the procedure well.  Disposition will be pending pathology   Assessment:    No obstetric history on file. Patient Active Problem List   Diagnosis Date Noted  . OSA on CPAP 04/05/2016  . Status post cervical spinal fusion 04/05/2016  . Pulmonary embolus ([place]) 04/04/2016  . Cervical  spondylosis with radiculopathy 03/29/2016  . Difficulty hearing 10/05/2014  . 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 Deep vein thrombosis of lower extremity ([place]) 10/05/2014  . Edema extremities 10/05/2014  . Esophagitis, reflux 10/05/2014  . Degenerative arthritis of hip 10/05/2014  . Depression, major, single episode, in partial remission ([place]) 10/05/2014  .  Pulmonary infarction ([place]) 10/05/2014  . Idiopathic insomnia 10/05/2014  . Cervical stenosis of spinal canal 06/25/2013  . Postural dizziness 06/22/2012  . Essential hypertension 06/22/2012  . Morbid obesity ([place]) 08/17/2010  . UNSPECIFIED ENDOCRINE DISORDER 05/15/2009  . CHEST PAIN UNSPECIFIED 01/08/2009  . DEPRESSION 08/21/2008  . OSTEOPOROSIS 08/21/2008  . LONG QT SYNDROME 08/21/2008     1. Postmenopausal bleeding   2. Morbid obesity ([place])   3. Thickened endometrium        Plan:            1.  Await endometrial biopsy results and base management upon results.  If results normal consider 3 months of progesterone to reduce endometrial lining and discontinue postmenopausal vaginal bleeding Orders No orders of the defined types were placed in this encounter.   No orders of the defined types were placed in this encounter.     F/U  Return for We will contact her with any abnormal test results. I spent 18 minutes involved in the care of this patient of which greater than 50% was spent discussing ultrasound findings, endometrial thickening, uterine fibroids, effect of menopause on endometrial lining and uterine fibroids, endometrial biopsy.  All questions answered  Finis Bud, M.D. 09/13/2018 3:55 PM

## 2018-09-15 ENCOUNTER — Ambulatory Visit
Admission: EM | Admit: 2018-09-15 | Discharge: 2018-09-15 | Disposition: A | Discharge status: Home / Self Care | Payer: Medicare Other | Attending: Family Medicine | Admitting: Family Medicine

## 2018-09-15 ENCOUNTER — Ambulatory Visit: Payer: Medicare Other

## 2018-09-15 ENCOUNTER — Other Ambulatory Visit: Payer: Self-pay

## 2018-09-15 ENCOUNTER — Encounter: Payer: Self-pay | Admitting: Emergency Medicine

## 2018-09-15 DIAGNOSIS — M19072 Primary osteoarthritis, left ankle and foot: Secondary | ICD-10-CM | POA: Diagnosis not present

## 2018-09-15 DIAGNOSIS — M79672 Pain in left foot: Secondary | ICD-10-CM

## 2018-09-15 MED ORDER — KETOROLAC TROMETHAMINE 10 MG PO TABS: 10.0000 mg | ORAL_TABLET | Freq: Three times a day (TID) | ORAL | 0 refills | Status: DC | PRN

## 2018-09-15 MED ORDER — METHYLPREDNISOLONE 4 MG PO TBPK: ORAL_TABLET | ORAL | 0 refills | Status: DC

## 2018-09-15 NOTE — Discharge Instructions (Addendum)
It was very nice meeting you today in clinic. Thank you for entrusting me with your care.   As discussed, your xray did not show anything acute.   Please utilize the medications that we discussed. Your prescriptions have been called in to your pharmacy.   Make arrangements to follow up with your regular doctor in 1 week for re-evaluation. If your symptoms/condition worsens, please seek follow up care either here or in the ER. Please remember, our [facility] providers are "right here with you" when you need [place].   Again, it was my pleasure to take care of you today. Thank you for choosing our clinic. I hope that you start to feel better quickly.   Honor Loh, MSN, APRN, FNP-C, CEN Advanced Practice Provider [facility]

## 2018-09-15 NOTE — ED Triage Notes (Signed)
Patient c/o left foot pain that started 3 days ago. Denies injury.

## 2018-09-15 NOTE — ED Provider Notes (Signed)
[address], [place], [place] [address] [phone]   Name: [name] DOB: May 26, 1965 MRN: [id number] CSN: [id number] PCP: [name], NP  Arrival date and time:  09/15/18 1406  Chief Complaint:  Foot Pain  NOTE: Prior to seeing the patient today, I have reviewed the triage nursing documentation and vital signs. Clinical staff has updated patient's PMH/PSHx, current medication list, and drug allergies/intolerances to ensure comprehensive history available to assist in medical decision making.   History:   HPI: [name] is a 53 y.o. female who presents today with complaints of LEFT foot pain that began with acute onset x 3 days ago. Patient denies injury. Pain is described as a "sharp ache" that runs from the top of her foot and extending laterally with increased tenderness over the lateral malleolus. Patient notes that the pain feels like she rolled it, but she denies. Patient with previous surgery to her foot; large scar noted to medial aspect. Patient has been taking APAP and IBU with no perceived relief.   Past Medical History:  Diagnosis Date  . Anemia   . Anxiety   . Arthritis    osteoarthritis  . Atypical chest pain    negative Myoview in 2011 with no ischemi and normal LV function.   . Benign neoplasm of colon   . Depression   . DVT (deep venous thrombosis) ([place])   . Dyspnea   . GERD (gastroesophageal reflux disease)    uses Nexium  . HTN (hypertension)   . Long Q-T syndrome   . Obesity   . OSA (obstructive sleep apnea)    uses C-PAP  . PE (pulmonary embolism)   . Peripheral vascular disease ([place])    blood clot in right leg  . Prolonged QT interval    gene positive  . Seizures ([place])    hasn't had any for 25 years    Past Surgical History:  Procedure Laterality Date  . ANTERIOR CERVICAL DECOMP/DISCECTOMY FUSION N/A 06/25/2013   Procedure: ANTERIOR CERVICAL DECOMPRESSION/DISCECTOMY FUSION 2 LEVELS LEVELS C FOUR/FIVE, SIX/SEVEN;   Surgeon: Floyce Stakes, MD;  Location: [facility];  Service: Neurosurgery;  Laterality: N/A;  . ANTERIOR CERVICAL DECOMP/DISCECTOMY FUSION N/A 03/29/2016   Procedure: CERVICAL THREE-FOUR, CERVICAL FOUR-FIVE ANTERIOR CERVICAL DECOMPRESSION/DISCECTOMY/FUSION WITH REMOVAL OF PLATES AT F6-3 W4-6;  Surgeon: Leeroy Cha, MD;  Location: [facility];  Service: Neurosurgery;  Laterality: N/A;  C3-4 C5-6 ANTERIOR CERVICAL DECOMPRESSION/DISCECTOMY/FUSION WITH POSSIBLE REMOVAL OF PLATE AT K5-9 D3-5  . BACK SURGERY    . CARDIAC CATHETERIZATION    . COLONOSCOPY    . COLONOSCOPY WITH PROPOFOL N/A 01/16/2018   Procedure: COLONOSCOPY WITH PROPOFOL;  Surgeon: Lollie Sails, MD;  Location: [facility] ENDOSCOPY;  Service: Endoscopy;  Laterality: N/A;  . DILATION AND CURETTAGE OF UTERUS    . ESOPHAGOGASTRODUODENOSCOPY (EGD) WITH PROPOFOL N/A 11/17/2015   Procedure: ESOPHAGOGASTRODUODENOSCOPY (EGD) WITH PROPOFOL;  Surgeon: Lollie Sails, MD;  Location: [facility] ENDOSCOPY;  Service: Endoscopy;  Laterality: N/A;  . ESOPHAGOGASTRODUODENOSCOPY (EGD) WITH PROPOFOL N/A 01/16/2018   Procedure: ESOPHAGOGASTRODUODENOSCOPY (EGD) WITH PROPOFOL;  Surgeon: Lollie Sails, MD;  Location: [facility] ENDOSCOPY;  Service: Endoscopy;  Laterality: N/A;  . JOINT REPLACEMENT  04/2006   hip  . left foot surgery    . left hand surgery x 2    . LUMBAR DISC SURGERY    . right hand surgery for torn ligament    . TOTAL HIP ARTHROPLASTY      Family History  Problem  Relation Age of Onset  . Long QT syndrome Mother   . CAD Mother   . Diabetes Father   . CAD Father   . Diabetes Brother   . Breast cancer Maternal Aunt 70  . Breast cancer Paternal Aunt   . Breast cancer Maternal Aunt 38    Social History   Socioeconomic History  . Marital status: Single    Spouse name: Not on file  . Number of children: Not on file  . Years of education: Not on file  . Highest education level: Not on file  Occupational History  . Occupation:  disabled  Social Needs  . Financial resource strain: Not on file  . Food insecurity:    Worry: Not on file    Inability: Not on file  . Transportation needs:    Medical: Not on file    Non-medical: Not on file  Tobacco Use  . Smoking status: Former Smoker    Last attempt to quit: 08/16/2001    Years since quitting: 17.0  . Smokeless tobacco: Never Used  Substance and Sexual Activity  . Alcohol use: Yes    Alcohol/week: 2.0 standard drinks    Types: 2 Standard drinks or equivalent per week    Comment: very rare  . Drug use: Not Currently    Comment: cocaine remission abstinent since 01/2008  . Sexual activity: Never  Lifestyle  . Physical activity:    Days per week: Not on file    Minutes per session: Not on file  . Stress: Not on file  Relationships  . Social connections:    Talks on phone: Not on file    Gets together: Not on file    Attends religious service: Not on file    Active member of club or organization: Not on file    Attends meetings of clubs or organizations: Not on file    Relationship status: Not on file  . Intimate partner violence:    Fear of current or ex partner: Not on file    Emotionally abused: Not on file    Physically abused: Not on file    Forced sexual activity: Not on file  Other Topics Concern  . Not on file  Social History Narrative  . Not on file    Patient Active Problem List   Diagnosis Date Noted  . OSA on CPAP 04/05/2016  . Status post cervical spinal fusion 04/05/2016  . Pulmonary embolus ([place]) 04/04/2016  . Cervical spondylosis with radiculopathy 03/29/2016  . Difficulty hearing 10/05/2014  . 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 Deep vein thrombosis of lower extremity ([place]) 10/05/2014  . Edema extremities 10/05/2014  . Esophagitis, reflux 10/05/2014  . Degenerative arthritis of hip 10/05/2014  . Depression, major, single episode, in partial remission ([place]) 10/05/2014  . Pulmonary infarction ([place]) 10/05/2014  . Idiopathic  insomnia 10/05/2014  . Cervical stenosis of spinal canal 06/25/2013  . Postural dizziness 06/22/2012  . Essential hypertension 06/22/2012  . Morbid obesity ([place]) 08/17/2010  . UNSPECIFIED ENDOCRINE DISORDER 05/15/2009  . CHEST PAIN UNSPECIFIED 01/08/2009  . DEPRESSION 08/21/2008  . OSTEOPOROSIS 08/21/2008  . LONG QT SYNDROME 08/21/2008    Home Medications:    Current Meds  Medication Sig  . albuterol (PROVENTIL HFA;VENTOLIN HFA) 108 (90 BASE) MCG/ACT inhaler Inhale 1-2 puffs into the lungs every 4 (four) hours as needed for wheezing or shortness of breath.  . budesonide-formoterol (SYMBICORT) 160-4.5 MCG/ACT inhaler Inhale 2 puffs into the lungs 2 (two) times daily  as needed (for shortness of breath).  . cyclobenzaprine (FLEXERIL) 10 MG tablet Take 0.5 tablets (5 mg total) by mouth 3 (three) times daily as needed for muscle spasms.  Marland Kitchen escitalopram (LEXAPRO) 20 MG tablet Take 20 mg by mouth daily.   Marland Kitchen esomeprazole (NEXIUM) 40 MG capsule Take 1 capsule (40 mg total) by mouth at bedtime.  . Ferrous Sulfate (SLOW RELEASE IRON PO) Take 142 mg by mouth daily.   . fluocinonide (LIDEX) 0.05 % external solution Apply 1 application topically 2 (two) times daily.  . Glucosamine-Chondroit-Vit C-Mn (GLUCOSAMINE 1500 COMPLEX) CAPS Take 1 each by mouth daily.  . hyoscyamine (LEVSIN, ANASPAZ) 0.125 MG tablet Take 0.125 mg by mouth daily.  Marland Kitchen labetalol (NORMODYNE) 200 MG tablet Take 1 tablet (200 mg total) by mouth 2 (two) times daily.  Marland Kitchen loratadine (CLARITIN) 10 MG tablet Take 10 mg by mouth daily.  . Multiple Vitamins-Minerals (MULTIVITAMIN WITH MINERALS) tablet Take 1 tablet by mouth daily.  . Omega-3 Fatty Acids (FISH OIL) 1000 MG CAPS Take 1 capsule by mouth 3 (three) times daily.    . potassium chloride SA (K-DUR,KLOR-CON) 20 MEQ tablet Take 1 tablet (20 mEq total) by mouth daily.  Marland Kitchen spironolactone (ALDACTONE) 25 MG tablet Take 1 tablet (25 mg total) by mouth daily.  Marland Kitchen  triamterene-hydrochlorothiazide (MAXZIDE) 75-50 MG tablet Take 1 tablet by mouth every evening.   . zolpidem (AMBIEN) 10 MG tablet Take 10 mg by mouth at bedtime as needed for sleep.    Allergies:   Patient has no known allergies.  Review of Systems (ROS): Review of Systems  Constitutional: Negative for chills and fever.  Respiratory: Negative for cough and shortness of breath.   Cardiovascular: Negative for chest pain and palpitations.  Musculoskeletal:       LEFT foot pain and swelling     Physical Exam:  Triage Vital Signs ED Triage Vitals  Enc Vitals Group     BP 09/15/18 1415 (!) 144/100     Pulse Rate 09/15/18 1415 87     Resp 09/15/18 1415 18     Temp 09/15/18 1415 98.2 F (36.8 C)     Temp Source 09/15/18 1415 Oral     SpO2 09/15/18 1415 98 %     Weight --      Height --      Head Circumference --      Peak Flow --      Pain Score 09/15/18 1416 10     Pain Loc --      Pain Edu? --      Excl. in [place]? --     Physical Exam  Constitutional: She is oriented to person, place, and time and well-developed, well-nourished, and in no distress.  HENT:  Head: Normocephalic and atraumatic.  Mouth/Throat: Mucous membranes are normal.  Cardiovascular: Normal rate, regular rhythm, normal heart sounds and intact distal pulses. Exam reveals no gallop and no friction rub.  No murmur heard. Pulmonary/Chest: Effort normal and breath sounds normal. No respiratory distress. She has no wheezes. She has no rales.  Musculoskeletal:     Left ankle: She exhibits decreased range of motion and swelling. She exhibits no ecchymosis and normal pulse. Tenderness. Lateral malleolus tenderness found.     Comments: Previous surgery to LEFT ankle; scar noted over medial malleolus   Neurological: She is alert and oriented to person, place, and time. Gait (2/2 acute pain in foot) abnormal.  Skin: Skin is warm and dry. No rash noted. No  erythema.  Psychiatric: Mood, affect and judgment normal.   Nursing note and vitals reviewed.    Urgent Care Treatments / Results:   LABS: PLEASE NOTE: all labs that were ordered this encounter are listed, however only abnormal results are displayed. Labs Reviewed - No data to display  EKG: -None  RADIOLOGY: Dg Foot Complete Left  Result Date: 09/15/2018 CLINICAL DATA:  Pain and swelling for 3 days. EXAM: LEFT FOOT - COMPLETE 3+ VIEW COMPARISON:  None FINDINGS: There is no evidence of fracture or dislocation. Pes planus deformity. Hallux valgus deformity with degenerative changes at the first MTP joint. Soft tissues are unremarkable. IMPRESSION: 1. No acute osseous abnormality. 2. Pes planus and hallux valgus deformities. Electronically Signed   By: Kerby Moors M.D.   On: 09/15/2018 15:11    PRODEDURES: Procedures  MEDICATIONS RECEIVED THIS VISIT: Medications - No data to display  PERTINENT CLINICAL COURSE NOTES/UPDATES: No data to display   Initial Impression / Assessment and Plan / Urgent Care Course:    [name] is a 53 y.o. female who presents to [facility] today with complaints of Foot Pain  Pertinent labs & imaging results that were available during my care of the patient were personally reviewed by me and considered in my medical decision making (see lab/imaging section of note for values and interpretations).  Exam reassuring. Patient with acute pain x 3 days with no associated injury. Tenderness with palpation noted over lateral malleolus. Suspect degree of enthesopathic pain. Will give short course of anti-inflammatories and systemic steroids. Patient encouraged to alternate between heat/ice to help with pain. Will follow up with orthopedics in 1 week if not improving. Name and office contact information provided for Dr. Leim Fabry).   I have reviewed the follow up and strict return precautions for any new or worsening symptoms. Patient is aware of symptoms that would be deemed urgent/emergent, and would  thus require further evaluation either here or in the emergency department. At the time of discharge, she verbalized understanding and consent with the discharge plan as it was reviewed with her. All questions were fielded by provider and/or clinic staff prior to patient discharge.    Final Clinical Impressions(s) / Urgent Care Diagnoses:   Final diagnoses:  Foot pain, left    New Prescriptions:   Meds ordered this encounter  Medications  . methylPREDNISolone (MEDROL DOSEPAK) 4 MG TBPK tablet    Sig: Taper per package instructions.    Dispense:  21 tablet    Refill:  0  . ketorolac (TORADOL) 10 MG tablet    Sig: Take 1 tablet (10 mg total) by mouth every 8 (eight) hours as needed.    Dispense:  15 tablet    Refill:  0    Controlled Substance Prescriptions:  [place] Controlled Substance Registry consulted? Not Applicable  NOTE: This note was prepared using Dragon dictation software along with smaller phrase technology. Despite my best ability to proofread, there is the potential that transcriptional errors may still occur from this process, and are completely unintentional.     Karen Kitchens, NP 09/15/18 1558

## 2018-09-27 ENCOUNTER — Telehealth: Payer: Self-pay | Admitting: Obstetrics and Gynecology

## 2018-09-27 NOTE — Telephone Encounter (Signed)
Notified patient that results are normal. Patient would like to know what is going to be done to fix the fibroids.

## 2018-09-27 NOTE — Telephone Encounter (Signed)
The patient called and stated that she had a biopsy done and is calling to check the status of her results. Please advise.

## 2019-01-12 DIAGNOSIS — M25562 Pain in left knee: Secondary | ICD-10-CM | POA: Diagnosis not present

## 2019-01-12 DIAGNOSIS — Z043 Encounter for examination and observation following other accident: Secondary | ICD-10-CM | POA: Diagnosis not present

## 2019-02-07 DIAGNOSIS — M25562 Pain in left knee: Secondary | ICD-10-CM | POA: Diagnosis not present

## 2019-02-14 DIAGNOSIS — M25462 Effusion, left knee: Secondary | ICD-10-CM | POA: Diagnosis not present

## 2019-02-14 DIAGNOSIS — M7122 Synovial cyst of popliteal space [Baker], left knee: Secondary | ICD-10-CM | POA: Diagnosis not present

## 2019-02-14 DIAGNOSIS — M25562 Pain in left knee: Secondary | ICD-10-CM | POA: Diagnosis not present

## 2019-02-20 DIAGNOSIS — M25562 Pain in left knee: Secondary | ICD-10-CM | POA: Diagnosis not present

## 2019-03-07 DIAGNOSIS — I10 Essential (primary) hypertension: Secondary | ICD-10-CM | POA: Diagnosis not present

## 2019-03-07 DIAGNOSIS — I4581 Long QT syndrome: Secondary | ICD-10-CM | POA: Diagnosis not present

## 2019-03-07 DIAGNOSIS — K219 Gastro-esophageal reflux disease without esophagitis: Secondary | ICD-10-CM | POA: Diagnosis not present

## 2019-03-07 DIAGNOSIS — K449 Diaphragmatic hernia without obstruction or gangrene: Secondary | ICD-10-CM | POA: Diagnosis not present

## 2019-03-12 ENCOUNTER — Other Ambulatory Visit: Payer: Self-pay | Admitting: Internal Medicine

## 2019-03-12 NOTE — Telephone Encounter (Signed)
New message   Pt c/o medication issue:  1. Name of Medication: labetalol (NORMODYNE) 200 MG tablet  2. How are you currently taking this medication (dosage and times per day)? As written  3. Are you having a reaction (difficulty breathing--STAT)? No   4. What is your medication issue? Patient needs a new prescription sent to [facility] [address] in [place] , patient wants a 90 day supply.

## 2019-03-13 NOTE — Telephone Encounter (Signed)
Pt calling requesting a refill on labetalol. Pt has not been seen since 2018, pt has an upcoming appt with Dr. Caryl Comes in January 2021. Would Dr. Caryl Comes like to refill this medication? Please address

## 2019-03-13 NOTE — Telephone Encounter (Signed)
Informed pt that refill request was forwarded to Dr. Caryl Comes for approval being that she has not been seen in over 2 years. Also informed pt that it may be next week before she hears back from office about this matter being that Dr.Klein is out of the office until Monday. Pt verbalized understanding.

## 2019-03-15 MED ORDER — LABETALOL HCL 200 MG PO TABS: 200.0000 mg | ORAL_TABLET | Freq: Two times a day (BID) | ORAL | 0 refills | Status: DC

## 2019-03-15 NOTE — Telephone Encounter (Signed)
Pt's medication was sent to pt's pharmacy as requested. Confirmation received.  °

## 2019-03-15 NOTE — Telephone Encounter (Signed)
Ok to refill 

## 2019-05-16 ENCOUNTER — Telehealth: Payer: Medicare HMO | Admitting: Internal Medicine

## 2019-05-23 DIAGNOSIS — I4581 Long QT syndrome: Secondary | ICD-10-CM | POA: Insufficient documentation

## 2019-05-24 ENCOUNTER — Ambulatory Visit (INDEPENDENT_AMBULATORY_CARE_PROVIDER_SITE_OTHER): Payer: Medicare HMO | Admitting: Internal Medicine

## 2019-05-24 ENCOUNTER — Other Ambulatory Visit: Payer: Self-pay

## 2019-05-24 ENCOUNTER — Ambulatory Visit: Payer: Medicare HMO | Admitting: Internal Medicine

## 2019-05-24 ENCOUNTER — Encounter: Payer: Self-pay | Admitting: Internal Medicine

## 2019-05-24 VITALS — BP 114/80 | HR 76 | Ht 64.0 in | Wt 293.0 lb

## 2019-05-24 DIAGNOSIS — I4581 Long QT syndrome: Secondary | ICD-10-CM | POA: Diagnosis not present

## 2019-05-24 DIAGNOSIS — I10 Essential (primary) hypertension: Secondary | ICD-10-CM

## 2019-05-24 NOTE — Patient Instructions (Signed)
Medication Instructions:  Your physician recommends that you continue on your current medications as directed. Please refer to the Current Medication list given to you today.  *If you need a refill on your cardiac medications before your next appointment, please call your pharmacy*  Lab Work: None ordered.  If you have labs (blood work) drawn today and your tests are completely normal, you will receive your results only by: Marland Kitchen MyChart Message (if you have MyChart) OR . A paper copy in the mail If you have any lab test that is abnormal or we need to change your treatment, we will call you to review the results.  Testing/Procedures: None ordered.   Follow-Up: At [facility], you and your health needs are our priority.  As part of our continuing mission to provide you with exceptional heart care, we have created designated Provider Care Teams.  These Care Teams include your primary Cardiologist (physician) and Advanced Practice Providers (APPs -  Physician Assistants and Nurse Practitioners) who all work together to provide you with the care you need, when you need it.  Your next appointment:  One year with Dr Caryl Comes

## 2019-05-24 NOTE — Progress Notes (Signed)
Patient Care Team: [name], NP as PCP - General (Nurse Practitioner)   HPI  [name] is a 54 y.o. 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 She takes Inderal. She has had a history of atypical chest pain. She underwent Myoview scanning 2011 which demonstrated no ischemia and normal left ventricular function   She is currently being diagnosed with bilateral pulmonary embolism related to left lower extremity DVT. It was ascribed to a prior fall.  She was on apixaban   Moderate DOE, weight up considerably  Some edema no chest pain, syncope or palpitations  No longer on Anticoagulation;  No bleeding issues     Date Cr K Hgb  7/18 1.18 3.8 12.3  11//20 1.1 4.2 13.0      Past Medical History:  Diagnosis Date  . Anemia   . Anxiety   . Arthritis    osteoarthritis  . Atypical chest pain    negative Myoview in 2011 with no ischemi and normal LV function.   . Benign neoplasm of colon   . Depression   . DVT (deep venous thrombosis) ([place])   . Dyspnea   . GERD (gastroesophageal reflux disease)    uses Nexium  . HTN (hypertension)   . Long Q-T syndrome   . Obesity   . OSA (obstructive sleep apnea)    uses C-PAP  . PE (pulmonary embolism)   . Peripheral vascular disease ([place])    blood clot in right leg  . Prolonged QT interval    gene positive  . Seizures ([place])    hasn't had any for 25 years    Past Surgical History:  Procedure Laterality Date  . ANTERIOR CERVICAL DECOMP/DISCECTOMY FUSION N/A 06/25/2013   Procedure: ANTERIOR CERVICAL DECOMPRESSION/DISCECTOMY FUSION 2 LEVELS LEVELS C FOUR/FIVE, SIX/SEVEN;  Surgeon: Floyce Stakes, MD;  Location: [facility];  Service: Neurosurgery;  Laterality: N/A;  . ANTERIOR CERVICAL DECOMP/DISCECTOMY FUSION N/A 03/29/2016   Procedure: CERVICAL THREE-FOUR, CERVICAL FOUR-FIVE ANTERIOR CERVICAL DECOMPRESSION/DISCECTOMY/FUSION WITH REMOVAL OF PLATES AT D34-534 D34-534;   Surgeon: Leeroy Cha, MD;  Location: [place];  Service: Neurosurgery;  Laterality: N/A;  C3-4 C5-6 ANTERIOR CERVICAL DECOMPRESSION/DISCECTOMY/FUSION WITH POSSIBLE REMOVAL OF PLATE AT D34-534 D34-534  . BACK SURGERY    . CARDIAC CATHETERIZATION    . COLONOSCOPY    . COLONOSCOPY WITH PROPOFOL N/A 01/16/2018   Procedure: COLONOSCOPY WITH PROPOFOL;  Surgeon: Lollie Sails, MD;  [facility];  Service: Endoscopy;  Laterality: N/A;  . DILATION AND CURETTAGE OF UTERUS    . ESOPHAGOGASTRODUODENOSCOPY (EGD) WITH PROPOFOL N/A 11/17/2015   Procedure: ESOPHAGOGASTRODUODENOSCOPY (EGD) WITH PROPOFOL;  Surgeon: Lollie Sails, MD;  Location: [facility] ENDOSCOPY;  Service: Endoscopy;  Laterality: N/A;  . ESOPHAGOGASTRODUODENOSCOPY (EGD) WITH PROPOFOL N/A 01/16/2018   Procedure: ESOPHAGOGASTRODUODENOSCOPY (EGD) WITH PROPOFOL;  Surgeon: Lollie Sails, MD;  Location: [facility] ENDOSCOPY;  Service: Endoscopy;  Laterality: N/A;  . JOINT REPLACEMENT  04/2006   hip  . left foot surgery    . left hand surgery x 2    . LUMBAR DISC SURGERY    . right hand surgery for torn ligament    . TOTAL HIP ARTHROPLASTY      Current Outpatient Medications  Medication Sig Dispense Refill  . albuterol (PROVENTIL HFA;VENTOLIN HFA) 108 (90 BASE) MCG/ACT inhaler Inhale 1-2 puffs into the lungs every 4 (four) hours as needed for wheezing or shortness of breath.    Marland Kitchen  budesonide-formoterol (SYMBICORT) 160-4.5 MCG/ACT inhaler Inhale 2 puffs into the lungs 2 (two) times daily as needed (for shortness of breath).    . cyclobenzaprine (FLEXERIL) 10 MG tablet Take 0.5 tablets (5 mg total) by mouth 3 (three) times daily as needed for muscle spasms. 20 tablet 0  . escitalopram (LEXAPRO) 20 MG tablet Take 20 mg by mouth daily.     Marland Kitchen esomeprazole (NEXIUM) 40 MG capsule Take 1 capsule (40 mg total) by mouth at bedtime. 10 capsule 0  . Ferrous Sulfate (SLOW RELEASE IRON PO) Take 142 mg by mouth daily.     . fluocinonide (LIDEX) 0.05 %  external solution Apply 1 application topically 2 (two) times daily.    . Glucosamine-Chondroit-Vit C-Mn (GLUCOSAMINE 1500 COMPLEX) CAPS Take 1 each by mouth daily.    . hyoscyamine (LEVSIN, ANASPAZ) 0.125 MG tablet Take 0.125 mg by mouth daily.    Marland Kitchen ketorolac (TORADOL) 10 MG tablet Take 1 tablet (10 mg total) by mouth every 8 (eight) hours as needed. 15 tablet 0  . labetalol (NORMODYNE) 200 MG tablet Take 1 tablet (200 mg total) by mouth 2 (two) times daily. Please keep upcoming appt with Dr. Caryl Comes in January before anymore refills. Thank you 180 tablet 0  . loratadine (CLARITIN) 10 MG tablet Take 10 mg by mouth daily.    . methylPREDNISolone (MEDROL DOSEPAK) 4 MG TBPK tablet Taper per package instructions. 21 tablet 0  . Multiple Vitamins-Minerals (MULTIVITAMIN WITH MINERALS) tablet Take 1 tablet by mouth daily.    . nabumetone (RELAFEN) 750 MG tablet Take 1 tablet (750 mg total) by mouth 2 (two) times daily. 60 tablet 5  . Omega-3 Fatty Acids (FISH OIL) 1000 MG CAPS Take 1 capsule by mouth 3 (three) times daily.      . potassium chloride SA (K-DUR,KLOR-CON) 20 MEQ tablet Take 1 tablet (20 mEq total) by mouth daily. 30 tablet 5  . spironolactone (ALDACTONE) 25 MG tablet Take 1 tablet (25 mg total) by mouth daily. 30 tablet 5  . spironolactone-hydrochlorothiazide (ALDACTAZIDE) 25-25 MG tablet Take 1 tablet by mouth every morning.     . triamterene-hydrochlorothiazide (MAXZIDE) 75-50 MG tablet Take 1 tablet by mouth every evening.     . zolpidem (AMBIEN) 10 MG tablet Take 10 mg by mouth at bedtime as needed for sleep.     No current facility-administered medications for this visit.    No Known Allergies  Review of Systems negative except from HPI and PMH  Physical Exam BP 114/80   Pulse 76   Ht 5\' 4"  (1.626 m)   Wt 293 lb (132.9 kg)   SpO2 98%   BMI 50.29 kg/m  Well developed and nourished in no acute distress HENT normal Neck supple   Clear Regular rate and rhythm, no murmurs or  gallops Abd-soft with active BS No Clubbing cyanosis edema Skin-warm and dry A & Oriented  Grossly normal sensory and motor function  ECG sinus @ 75 04/09/42   Assessment and  Plan  Long QT syndrome  Hypertension  Orthostatic hypertension  Pulmonary embolism  Morbidly obese   Complicated BP and diuretic regime-- but working   BP well controlled  No syncope  Discussed diet and exercise for weight loss  We spent more than 50% of our >25 min visit in face to face counseling regarding the above

## 2019-07-01 ENCOUNTER — Ambulatory Visit: Payer: Medicare HMO | Admitting: Internal Medicine

## 2019-08-13 ENCOUNTER — Encounter: Payer: Self-pay | Admitting: Neurology

## 2019-10-17 ENCOUNTER — Ambulatory Visit: Payer: Medicare HMO | Admitting: Neurology

## 2019-10-17 NOTE — Progress Notes (Deleted)
NEUROLOGY CONSULTATION NOTE  [name] MRN: [id number] DOB: 13-Jul-1965  Referring provider: Daiva Eves, MD Primary care provider: Charlott Holler, NP  Reason for consult:  paresthesias  HISTORY OF PRESENT ILLNESS: [name] is a 54 year old ***-handed Black female with HTN, prolong QT syndrome, OSA,, anxiety and history of PE/DVT *** and remote history of seizures who presents for paresthesias ***.  History supplemented by referring provider's note.  She ***.  She has some chronic back pain with history of lumbar fusion in 2006 but currently no shooting pain radiating down the legs. She does have left hip pain related to osteoarthritis and FAI of the left hip.  Recent labs demonstrated Hgb A1c 6.0, B12 814 and TSH 0.870.  PAST MEDICAL HISTORY: Past Medical History:  Diagnosis Date  . Anemia   . Anxiety   . Arthritis    osteoarthritis  . Atypical chest pain    negative Myoview in 2011 with no ischemi and normal LV function.   . Benign neoplasm of colon   . Depression   . DVT (deep venous thrombosis) ([place])   . Dyspnea   . GERD (gastroesophageal reflux disease)    uses Nexium  . HTN (hypertension)   . Long Q-T syndrome   . Obesity   . OSA (obstructive sleep apnea)    uses C-PAP  . PE (pulmonary embolism)   . Peripheral vascular disease ([place])    blood clot in right leg  . Prolonged QT interval    gene positive  . Seizures ([place])    hasn't had any for 25 years    PAST SURGICAL HISTORY: Past Surgical History:  Procedure Laterality Date  . ANTERIOR CERVICAL DECOMP/DISCECTOMY FUSION N/A 06/25/2013   Procedure: ANTERIOR CERVICAL DECOMPRESSION/DISCECTOMY FUSION 2 LEVELS LEVELS C FOUR/FIVE, SIX/SEVEN;  Surgeon: Floyce Stakes, MD;  Location: [facility];  Service: Neurosurgery;  Laterality: N/A;  . ANTERIOR CERVICAL DECOMP/DISCECTOMY FUSION N/A 03/29/2016   Procedure: CERVICAL THREE-FOUR, CERVICAL FOUR-FIVE ANTERIOR CERVICAL DECOMPRESSION/DISCECTOMY/FUSION  WITH REMOVAL OF PLATES AT Y3-0 Z6-0;  Surgeon: Leeroy Cha, MD;  Location: [place];  Service: Neurosurgery;  Laterality: N/A;  C3-4 C5-6 ANTERIOR CERVICAL DECOMPRESSION/DISCECTOMY/FUSION WITH POSSIBLE REMOVAL OF PLATE AT F0-9 N2-3  . BACK SURGERY    . CARDIAC CATHETERIZATION    . COLONOSCOPY    . COLONOSCOPY WITH PROPOFOL N/A 01/16/2018   Procedure: COLONOSCOPY WITH PROPOFOL;  Surgeon: Lollie Sails, MD;  Location[facility] ENDOSCOPY;  Service: Endoscopy;  Laterality: N/A;  . DILATION AND CURETTAGE OF UTERUS    . ESOPHAGOGASTRODUODENOSCOPY (EGD) WITH PROPOFOL N/A 11/17/2015   Procedure: ESOPHAGOGASTRODUODENOSCOPY (EGD) WITH PROPOFOL;  Surgeon: Lollie Sails, MD;  Location[facility] ENDOSCOPY;  Service: Endoscopy;  Laterality: N/A;  . ESOPHAGOGASTRODUODENOSCOPY (EGD) WITH PROPOFOL N/A 01/16/2018   Procedure: ESOPHAGOGASTRODUODENOSCOPY (EGD) WITH PROPOFOL;  Surgeon: Lollie Sails, MD;  Location[facility] ENDOSCOPY;  Service: Endoscopy;  Laterality: N/A;  . JOINT REPLACEMENT  04/2006   hip  . left foot surgery    . left hand surgery x 2    . LUMBAR DISC SURGERY    . right hand surgery for torn ligament    . TOTAL HIP ARTHROPLASTY      MEDICATIONS: Current Outpatient Medications on File Prior to Visit  Medication Sig Dispense Refill  . albuterol (PROVENTIL HFA;VENTOLIN HFA) 108 (90 BASE) MCG/ACT inhaler Inhale 1-2 puffs into the lungs every 4 (four) hours as needed for wheezing or shortness of breath.    . budesonide-formoterol (SYMBICORT)  160-4.5 MCG/ACT inhaler Inhale 2 puffs into the lungs 2 (two) times daily as needed (for shortness of breath).    . cyclobenzaprine (FLEXERIL) 10 MG tablet Take 0.5 tablets (5 mg total) by mouth 3 (three) times daily as needed for muscle spasms. 20 tablet 0  . escitalopram (LEXAPRO) 20 MG tablet Take 20 mg by mouth daily.     Marland Kitchen esomeprazole (NEXIUM) 40 MG capsule Take 1 capsule (40 mg total) by mouth at bedtime. 10 capsule 0  . Ferrous Sulfate (SLOW RELEASE  IRON PO) Take 142 mg by mouth daily.     . fluocinonide (LIDEX) 0.05 % external solution Apply 1 application topically 2 (two) times daily.    . Glucosamine-Chondroit-Vit C-Mn (GLUCOSAMINE 1500 COMPLEX) CAPS Take 1 each by mouth daily.    . hyoscyamine (LEVSIN, ANASPAZ) 0.125 MG tablet Take 0.125 mg by mouth daily.    Marland Kitchen ketorolac (TORADOL) 10 MG tablet Take 1 tablet (10 mg total) by mouth every 8 (eight) hours as needed. 15 tablet 0  . labetalol (NORMODYNE) 200 MG tablet Take 1 tablet (200 mg total) by mouth 2 (two) times daily. Please keep upcoming appt with Dr. Caryl Comes in January before anymore refills. Thank you 180 tablet 0  . loratadine (CLARITIN) 10 MG tablet Take 10 mg by mouth daily.    . methylPREDNISolone (MEDROL DOSEPAK) 4 MG TBPK tablet Taper per package instructions. 21 tablet 0  . Multiple Vitamins-Minerals (MULTIVITAMIN WITH MINERALS) tablet Take 1 tablet by mouth daily.    . nabumetone (RELAFEN) 750 MG tablet Take 1 tablet (750 mg total) by mouth 2 (two) times daily. 60 tablet 5  . Omega-3 Fatty Acids (FISH OIL) 1000 MG CAPS Take 1 capsule by mouth 3 (three) times daily.      . potassium chloride SA (K-DUR,KLOR-CON) 20 MEQ tablet Take 1 tablet (20 mEq total) by mouth daily. 30 tablet 5  . spironolactone (ALDACTONE) 25 MG tablet Take 1 tablet (25 mg total) by mouth daily. 30 tablet 5  . triamterene-hydrochlorothiazide (MAXZIDE) 75-50 MG tablet Take 1 tablet by mouth every evening.     . zolpidem (AMBIEN) 10 MG tablet Take 10 mg by mouth at bedtime as needed for sleep.     No current facility-administered medications on file prior to visit.    ALLERGIES: No Known Allergies  FAMILY HISTORY: Family History  Problem Relation Age of Onset  . Long QT syndrome Mother   . CAD Mother   . Diabetes Father   . CAD Father   . Diabetes Brother   . Breast cancer Maternal Aunt 70  . Breast cancer Paternal Aunt   . Breast cancer Maternal Aunt 40   ***.  SOCIAL HISTORY: Social History    Socioeconomic History  . Marital status: Single    Spouse name: Not on file  . Number of children: Not on file  . Years of education: Not on file  . Highest education level: Not on file  Occupational History  . Occupation: disabled  Tobacco Use  . Smoking status: Former Smoker    Quit date: 08/16/2001    Years since quitting: 18.1  . Smokeless tobacco: Never Used  Vaping Use  . Vaping Use: Never used  Substance and Sexual Activity  . Alcohol use: Yes    Alcohol/week: 2.0 standard drinks    Types: 2 Standard drinks or equivalent per week    Comment: very rare  . Drug use: Not Currently    Comment: cocaine remission abstinent since  01/2008  . Sexual activity: Never  Other Topics Concern  . Not on file  Social History Narrative  . Not on file   Social Determinants of Health   Financial Resource Strain:   . Difficulty of Paying Living Expenses:   Food Insecurity:   . Worried About Charity fundraiser in the Last Year:   . Arboriculturist in the Last Year:   Transportation Needs:   . Film/video editor (Medical):   Marland Kitchen Lack of Transportation (Non-Medical):   Physical Activity:   . Days of Exercise per Week:   . Minutes of Exercise per Session:   Stress:   . Feeling of Stress :   Social Connections:   . Frequency of Communication with Friends and Family:   . Frequency of Social Gatherings with Friends and Family:   . Attends Religious Services:   . Active Member of Clubs or Organizations:   . Attends Archivist Meetings:   Marland Kitchen Marital Status:   Intimate Partner Violence:   . Fear of Current or Ex-Partner:   . Emotionally Abused:   Marland Kitchen Physically Abused:   . Sexually Abused:     REVIEW OF SYSTEMS: Constitutional: No fevers, chills, or sweats, no generalized fatigue, change in appetite Eyes: No visual changes, double vision, eye pain Ear, nose and throat: No hearing loss, ear pain, nasal congestion, sore throat Cardiovascular: No chest pain,  palpitations Respiratory:  No shortness of breath at rest or with exertion, wheezes GastrointestinaI: No nausea, vomiting, diarrhea, abdominal pain, fecal incontinence Genitourinary:  No dysuria, urinary retention or frequency Musculoskeletal:  No neck pain, back pain Integumentary: No rash, pruritus, skin lesions Neurological: as above Psychiatric: No depression, insomnia, anxiety Endocrine: No palpitations, fatigue, diaphoresis, mood swings, change in appetite, change in weight, increased thirst Hematologic/Lymphatic:  No purpura, petechiae. Allergic/Immunologic: no itchy/runny eyes, nasal congestion, recent allergic reactions, rashes  PHYSICAL EXAM: *** General: No acute distress.  Patient appears ***-groomed.  *** Head:  Normocephalic/atraumatic Eyes:  fundi examined but not visualized Neck: supple, no paraspinal tenderness, full range of motion Back: No paraspinal tenderness Heart: regular rate and rhythm Lungs: Clear to auscultation bilaterally. Vascular: No carotid bruits. Neurological Exam: Mental status: alert and oriented to person, place, and time, recent and remote memory intact, fund of knowledge intact, attention and concentration intact, speech fluent and not dysarthric, language intact. Cranial nerves: CN I: not tested CN II: pupils equal, round and reactive to light, visual fields intact CN III, IV, VI:  full range of motion, no nystagmus, no ptosis CN V: facial sensation intact CN VII: upper and lower face symmetric CN VIII: hearing intact CN IX, X: gag intact, uvula midline CN XI: sternocleidomastoid and trapezius muscles intact CN XII: tongue midline Bulk & Tone: normal, no fasciculations. Motor:  5/5 throughout *** Sensation:  [place] *** temperature *** and vibration sensation intact.  ***. Deep Tendon Reflexes:  2+ throughout, *** toes downgoing.  *** Finger to nose testing:  Without dysmetria.  *** Heel to shin:  Without dysmetria.  *** Gait:  Normal  station and stride.  Able to turn and tandem walk. [place] ***.  IMPRESSION: ***  PLAN: ***  Thank you for allowing me to take part in the care of this patient.  Metta Clines, DO  CC: ***

## 2020-01-01 ENCOUNTER — Encounter (HOSPITAL_COMMUNITY): Payer: Self-pay

## 2020-01-01 ENCOUNTER — Inpatient Hospital Stay (HOSPITAL_COMMUNITY)
Admission: EM | Admit: 2020-01-01 | Discharge: 2020-01-07 | DRG: 871 | Disposition: A | Discharge status: Home / Self Care | Payer: Medicare HMO | Attending: Internal Medicine | Admitting: Internal Medicine

## 2020-01-01 ENCOUNTER — Other Ambulatory Visit: Payer: Self-pay

## 2020-01-01 DIAGNOSIS — Z86718 Personal history of other venous thrombosis and embolism: Secondary | ICD-10-CM

## 2020-01-01 DIAGNOSIS — Z9989 Dependence on other enabling machines and devices: Secondary | ICD-10-CM

## 2020-01-01 DIAGNOSIS — A4189 Other specified sepsis: Principal | ICD-10-CM | POA: Diagnosis present

## 2020-01-01 DIAGNOSIS — Z86711 Personal history of pulmonary embolism: Secondary | ICD-10-CM

## 2020-01-01 DIAGNOSIS — I4581 Long QT syndrome: Secondary | ICD-10-CM | POA: Diagnosis present

## 2020-01-01 DIAGNOSIS — E785 Hyperlipidemia, unspecified: Secondary | ICD-10-CM | POA: Diagnosis present

## 2020-01-01 DIAGNOSIS — Z7984 Long term (current) use of oral hypoglycemic drugs: Secondary | ICD-10-CM

## 2020-01-01 DIAGNOSIS — E876 Hypokalemia: Secondary | ICD-10-CM | POA: Diagnosis present

## 2020-01-01 DIAGNOSIS — A419 Sepsis, unspecified organism: Secondary | ICD-10-CM

## 2020-01-01 DIAGNOSIS — K219 Gastro-esophageal reflux disease without esophagitis: Secondary | ICD-10-CM | POA: Diagnosis present

## 2020-01-01 DIAGNOSIS — U071 COVID-19: Secondary | ICD-10-CM | POA: Diagnosis present

## 2020-01-01 DIAGNOSIS — E1151 Type 2 diabetes mellitus with diabetic peripheral angiopathy without gangrene: Secondary | ICD-10-CM | POA: Diagnosis present

## 2020-01-01 DIAGNOSIS — G4733 Obstructive sleep apnea (adult) (pediatric): Secondary | ICD-10-CM

## 2020-01-01 DIAGNOSIS — F329 Major depressive disorder, single episode, unspecified: Secondary | ICD-10-CM | POA: Diagnosis present

## 2020-01-01 DIAGNOSIS — G894 Chronic pain syndrome: Secondary | ICD-10-CM | POA: Diagnosis present

## 2020-01-01 DIAGNOSIS — R0602 Shortness of breath: Secondary | ICD-10-CM

## 2020-01-01 DIAGNOSIS — R0902 Hypoxemia: Secondary | ICD-10-CM

## 2020-01-01 DIAGNOSIS — J1282 Pneumonia due to coronavirus disease 2019: Secondary | ICD-10-CM | POA: Diagnosis present

## 2020-01-01 DIAGNOSIS — Z6841 Body Mass Index (BMI) 40.0 and over, adult: Secondary | ICD-10-CM

## 2020-01-01 DIAGNOSIS — J9601 Acute respiratory failure with hypoxia: Secondary | ICD-10-CM | POA: Diagnosis present

## 2020-01-01 DIAGNOSIS — E1140 Type 2 diabetes mellitus with diabetic neuropathy, unspecified: Secondary | ICD-10-CM | POA: Diagnosis present

## 2020-01-01 DIAGNOSIS — Z79899 Other long term (current) drug therapy: Secondary | ICD-10-CM

## 2020-01-01 DIAGNOSIS — R5381 Other malaise: Secondary | ICD-10-CM | POA: Diagnosis present

## 2020-01-01 DIAGNOSIS — I10 Essential (primary) hypertension: Secondary | ICD-10-CM | POA: Diagnosis present

## 2020-01-01 NOTE — ED Notes (Signed)
Pt up walking around in the lobby, pt has been asked multiple times to wear a mask. Pt will not put on her mask and while up asking about getting to a room pt has been noted to cough on others.

## 2020-01-01 NOTE — ED Triage Notes (Signed)
Pt diagnosed with COVID on Monday and she complains of being short of breath with a cough, she was tested because a friend tested positive and she didn't have any symptoms until today

## 2020-01-01 NOTE — ED Triage Notes (Signed)
Pt says that she was prescribed something from her doctor and it was in her mailbox that she has been unable to get, also EMS reports that there was another person in her house that could walk to the mailbox EMS reports that she was able to walk to the stretcher

## 2020-01-01 NOTE — ED Provider Notes (Signed)
[facility] DEPT Provider Note: Georgena Spurling, MD, FACEP  CSN: [id number] MRN: [id number] ARRIVAL: 01/01/20 at 1932 ROOM: [place]  01/01/20 11:59 PM [name] is a 54 y.o. female who reportedly tested positive for Covid 3 days ago because she was exposed to a friend.  Yesterday she developed a cough with shortness of breath.  She tested positive despite getting her first Pfizer vaccine 7 days ago.  She describes her cough as severe.  She also has nasal congestion and body aches.  She denies vomiting or diarrhea.   Past Medical History:  Diagnosis Date   Anemia    Anxiety    Arthritis    osteoarthritis   Atypical chest pain    negative Myoview in 2011 with no ischemi and normal LV function.    Benign neoplasm of colon    Depression    DVT (deep venous thrombosis) (HCC)    Dyspnea    GERD (gastroesophageal reflux disease)    uses Nexium   HTN (hypertension)    Long Q-T syndrome    Obesity    OSA (obstructive sleep apnea)    uses C-PAP   PE (pulmonary embolism)    Peripheral vascular disease (HCC)    blood clot in right leg   Prolonged QT interval    gene positive   Seizures (HCC)    hasn't had any for 25 years    Past Surgical History:  Procedure Laterality Date   ANTERIOR CERVICAL DECOMP/DISCECTOMY FUSION N/A 06/25/2013   Procedure: ANTERIOR CERVICAL DECOMPRESSION/DISCECTOMY FUSION 2 LEVELS LEVELS C FOUR/FIVE, SIX/SEVEN;  Surgeon: Floyce Stakes, MD;  Location: [facility];  Service: Neurosurgery;  Laterality: N/A;   ANTERIOR CERVICAL DECOMP/DISCECTOMY FUSION N/A 03/29/2016   Procedure: CERVICAL THREE-FOUR, CERVICAL FOUR-FIVE ANTERIOR CERVICAL DECOMPRESSION/DISCECTOMY/FUSION WITH REMOVAL OF PLATES AT T5-5 D3-2;  Surgeon: Leeroy Cha, MD;  Location: [facility];  Service: Neurosurgery;  Laterality: N/A;  C3-4 C5-6 ANTERIOR CERVICAL DECOMPRESSION/DISCECTOMY/FUSION WITH  POSSIBLE REMOVAL OF PLATE AT K0-2 R4-2   BACK SURGERY     CARDIAC CATHETERIZATION     COLONOSCOPY     COLONOSCOPY WITH PROPOFOL N/A 01/16/2018   Procedure: COLONOSCOPY WITH PROPOFOL;  Surgeon: Lollie Sails, MD;  Location: [facility] ENDOSCOPY;  Service: Endoscopy;  Laterality: N/A;   DILATION AND CURETTAGE OF UTERUS     ESOPHAGOGASTRODUODENOSCOPY (EGD) WITH PROPOFOL N/A 11/17/2015   Procedure: ESOPHAGOGASTRODUODENOSCOPY (EGD) WITH PROPOFOL;  Surgeon: Lollie Sails, MD;  Location: [facility] ENDOSCOPY;  Service: Endoscopy;  Laterality: N/A;   ESOPHAGOGASTRODUODENOSCOPY (EGD) WITH PROPOFOL N/A 01/16/2018   Procedure: ESOPHAGOGASTRODUODENOSCOPY (EGD) WITH PROPOFOL;  Surgeon: Lollie Sails, MD;  Location: [facility] ENDOSCOPY;  Service: Endoscopy;  Laterality: N/A;   JOINT REPLACEMENT  04/2006   hip   left foot surgery     left hand surgery x 2     LUMBAR DISC SURGERY     right hand surgery for torn ligament     TOTAL HIP ARTHROPLASTY      Family History  Problem Relation Age of Onset   Long QT syndrome Mother    CAD Mother    Diabetes Father    CAD Father    Diabetes Brother    Breast cancer Maternal Aunt 70   Breast cancer Paternal Aunt    Breast cancer Maternal Aunt 7    Social History   Tobacco Use   Smoking status: Former Smoker  Quit date: 08/16/2001    Years since quitting: 18.3   Smokeless tobacco: Never Used  Vaping Use   Vaping Use: Never used  Substance Use Topics   Alcohol use: Yes    Alcohol/week: 2.0 standard drinks    Types: 2 Standard drinks or equivalent per week    Comment: very rare   Drug use: Not Currently    Comment: cocaine remission abstinent since 01/2008    Prior to Admission medications   Medication Sig Start Date End Date Taking? Authorizing Provider  albuterol (PROVENTIL HFA;VENTOLIN HFA) 108 (90 BASE) MCG/ACT inhaler Inhale 1-2 puffs into the lungs every 4 (four) hours as needed for wheezing or shortness of breath.     [provider]  azithromycin (ZITHROMAX) 250 MG tablet Take 250 mg by mouth daily. 12/30/19   [provider]  budesonide-formoterol (SYMBICORT) 160-4.5 MCG/ACT inhaler Inhale 2 puffs into the lungs 2 (two) times daily as needed (for shortness of breath).    [provider]  cyclobenzaprine (FLEXERIL) 10 MG tablet Take 0.5 tablets (5 mg total) by mouth 3 (three) times daily as needed for muscle spasms. 04/06/17   Charlann Lange, PA-C  escitalopram (LEXAPRO) 20 MG tablet Take 20 mg by mouth daily.  10/03/13   [provider]  esomeprazole (NEXIUM) 40 MG capsule Take 1 capsule (40 mg total) by mouth at bedtime. 2/70/62   Delora Fuel, MD  Ferrous Sulfate (SLOW RELEASE IRON PO) Take 142 mg by mouth daily.     [provider]  fluocinonide (LIDEX) 0.05 % external solution Apply 1 application topically 2 (two) times daily. 12/30/11   [provider]  gabapentin (NEURONTIN) 600 MG tablet Take 600 mg by mouth in the morning, at noon, and at bedtime. 12/13/19   [provider]  Glucosamine-Chondroit-Vit C-Mn (GLUCOSAMINE 1500 COMPLEX) CAPS Take 1 each by mouth daily.    [provider]  hyoscyamine (LEVSIN, ANASPAZ) 0.125 MG tablet Take 0.125 mg by mouth daily.    [provider]  ketorolac (TORADOL) 10 MG tablet Take 1 tablet (10 mg total) by mouth every 8 (eight) hours as needed. 09/15/18   Karen Kitchens, NP  labetalol (NORMODYNE) 200 MG tablet Take 1 tablet (200 mg total) by mouth 2 (two) times daily. Please keep upcoming appt with Dr. Caryl Comes in January before anymore refills. Thank you 03/15/19   Deboraha Sprang, MD  lisinopril (ZESTRIL) 2.5 MG tablet Take 2.5 mg by mouth daily. 12/22/19   [provider]  loratadine (CLARITIN) 10 MG tablet Take 10 mg by mouth daily.    [provider]  metFORMIN (GLUCOPHAGE) 500 MG tablet Take 500 mg by mouth daily. 12/22/19   [provider]  methylPREDNISolone (MEDROL  DOSEPAK) 4 MG TBPK tablet Taper per package instructions. 09/15/18   Karen Kitchens, NP  Multiple Vitamins-Minerals (MULTIVITAMIN WITH MINERALS) tablet Take 1 tablet by mouth daily.    [provider]  nabumetone (RELAFEN) 750 MG tablet Take 1 tablet (750 mg total) by mouth 2 (two) times daily. 02/23/16   Glean Hess, MD  Omega-3 Fatty Acids (FISH OIL) 1000 MG CAPS Take 1 capsule by mouth 3 (three) times daily.      [provider]  potassium chloride SA (K-DUR,KLOR-CON) 20 MEQ tablet Take 1 tablet (20 mEq total) by mouth daily. 04/08/14   Nahser, Wonda Cheng, MD  spironolactone (ALDACTONE) 25 MG tablet Take 1 tablet (25 mg total) by mouth daily. 05/08/14   Virl Axe  C, MD  triamterene-hydrochlorothiazide (MAXZIDE) 75-50 MG tablet Take 1 tablet by mouth every evening.  01/10/17   [provider]  Vitamin D, Ergocalciferol, (DRISDOL) 1.25 MG (50000 UNIT) CAPS capsule Take 50,000 Units by mouth once a week. 11/10/19   [provider]  zolpidem (AMBIEN) 10 MG tablet Take 10 mg by mouth at bedtime as needed for sleep.    [provider]    Allergies Patient has no known allergies.   REVIEW OF SYSTEMS  Negative except as noted here or in the History of Present Illness.   PHYSICAL EXAMINATION  Initial Vital Signs Blood pressure 118/62, pulse 95, temperature (!) 100.4 F (38 C), temperature source Oral, resp. rate 16, height 5\' 4"  (1.626 m), weight 124.3 kg, SpO2 96 %.  Examination General: Well-developed, well-nourished female in no acute distress; appearance consistent with age of record HENT: normocephalic; atraumatic Eyes: pupils equal, round and reactive to light; extraocular muscles intact Neck: supple Heart: regular rate and rhythm Lungs: clear to auscultation bilaterally; persistent cough Abdomen: soft; nondistended; nontender; bowel sounds present Extremities: No deformity; full range of motion; pulses normal Neurologic: Awake, alert and  oriented; motor function intact in all extremities and symmetric; no facial droop Skin: Warm and dry Psychiatric: Normal mood and affect   RESULTS  Summary of this visit's results, reviewed and interpreted by myself:   EKG Interpretation  Date/Time:    Ventricular Rate:    PR Interval:    QRS Duration:   QT Interval:    QTC Calculation:   R Axis:     Text Interpretation:        Laboratory Studies: Results for orders placed or performed during the hospital encounter of 01/01/20 (from the past 24 hour(s))  CBC with Differential/Platelet     Status: Abnormal   Collection Time: 01/01/20 11:58 PM  Result Value Ref Range   WBC 3.6 (L) 4.0 - 10.5 K/uL   RBC 4.79 3.87 - 5.11 MIL/uL   Hemoglobin 13.8 12.0 - 15.0 g/dL   HCT 43.2 36 - 46 %   MCV 90.2 80.0 - 100.0 fL   MCH 28.8 26.0 - 34.0 pg   MCHC 31.9 30.0 - 36.0 g/dL   RDW 13.8 11.5 - 15.5 %   Platelets 179 150 - 400 K/uL   nRBC 0.0 0.0 - 0.2 %   Neutrophils Relative % 70 %   Neutro Abs 2.5 1.7 - 7.7 K/uL   Lymphocytes Relative 26 %   Lymphs Abs 0.9 0.7 - 4.0 K/uL   Monocytes Relative 4 %   Monocytes Absolute 0.1 0 - 1 K/uL   Eosinophils Relative 0 %   Eosinophils Absolute 0.0 0 - 0 K/uL   Basophils Relative 0 %   Basophils Absolute 0.0 0 - 0 K/uL   Immature Granulocytes 0 %   Abs Immature Granulocytes 0.01 0.00 - 0.07 K/uL  Basic metabolic panel     Status: Abnormal   Collection Time: 01/01/20 11:58 PM  Result Value Ref Range   Sodium 138 135 - 145 mmol/L   Potassium 3.2 (L) 3.5 - 5.1 mmol/L   Chloride 97 (L) 98 - 111 mmol/L   CO2 25 22 - 32 mmol/L   Glucose, Bld 114 (H) 70 - 99 mg/dL   BUN 14 6 - 20 mg/dL   Creatinine, Ser 1.25 (H) 0.44 - 1.00 mg/dL   Calcium 8.6 (L) 8.9 - 10.3 mg/dL   GFR calc non Af Amer 49 (L) >60 mL/min  GFR calc Af Amer 56 (L) >60 mL/min   Anion gap 16 (H) 5 - 15  SARS Coronavirus 2 by RT PCR (hospital order, performed in [facility] hospital lab) Nasopharyngeal Nasopharyngeal Swab      Status: Abnormal   Collection Time: 01/01/20 11:58 PM   Specimen: Nasopharyngeal Swab  Result Value Ref Range   SARS Coronavirus 2 POSITIVE (A) NEGATIVE   Imaging Studies: DG Chest Port 1 View  Result Date: 01/02/2020 CLINICAL DATA:  COVID-19 positive, short of breath EXAM: PORTABLE CHEST 1 VIEW COMPARISON:  03/16/2016 FINDINGS: Single frontal view of the chest demonstrates an unremarkable cardiac silhouette. The thoracic aorta is ectatic. There are scattered bilateral areas of airspace disease, with relative sparing of the left upper lung zone. No effusion or pneumothorax. Postsurgical changes cervical spine. No acute bony abnormalities. IMPRESSION: 1. Bilateral patchy airspace disease consistent with multifocal pneumonia. Pattern is consistent with COVID-19. Electronically Signed   By: Randa Ngo M.D.   On: 01/02/2020 00:30    ED COURSE and MDM  Nursing notes, initial and subsequent vitals signs, including pulse oximetry, reviewed and interpreted by myself.  Vitals:   01/02/20 0018 01/02/20 0100 01/02/20 0108 01/02/20 0119  BP: (!) 142/107 109/63    Pulse: 93 89 94   Resp: (!) 25 (!) 26 (!) 26 (!) 29  Temp: 99.9 F (37.7 C) 99.9 F (37.7 C)    TempSrc: Oral Oral    SpO2: 99% 97% 100%   Weight:      Height:       Medications  dexamethasone (DECADRON) injection 6 mg (has no administration in time range)  potassium chloride SA (KLOR-CON) CR tablet 40 mEq (has no administration in time range)  chlorpheniramine-HYDROcodone (TUSSIONEX) 10-8 MG/5ML suspension 5 mL (5 mLs Oral Given 01/02/20 0052)  acetaminophen (TYLENOL) tablet 650 mg (650 mg Oral Given 01/02/20 0051)   2:16 AM Oxygen saturation has dropped to 92% on room air.  Patient tachypneic in the 20s to 30 respirations per minute.  We will start remdesivir and steroids and have patient admitted.  3:06 AM Dr. Marlowe Sax to admit.    PROCEDURES  Procedures   ED DIAGNOSES     ICD-10-CM   1. Pneumonia due to COVID-19 virus   U07.1    J12.82   2. Hypoxia  R09.02   3. Hypokalemia  E87.6        Nikolas Casher, Jenny Reichmann, MD 01/02/20 [phone]

## 2020-01-02 ENCOUNTER — Emergency Department (HOSPITAL_COMMUNITY): Payer: Medicare HMO

## 2020-01-02 DIAGNOSIS — G894 Chronic pain syndrome: Secondary | ICD-10-CM | POA: Diagnosis present

## 2020-01-02 DIAGNOSIS — G4733 Obstructive sleep apnea (adult) (pediatric): Secondary | ICD-10-CM

## 2020-01-02 DIAGNOSIS — Z79899 Other long term (current) drug therapy: Secondary | ICD-10-CM | POA: Diagnosis not present

## 2020-01-02 DIAGNOSIS — K219 Gastro-esophageal reflux disease without esophagitis: Secondary | ICD-10-CM | POA: Diagnosis present

## 2020-01-02 DIAGNOSIS — J1282 Pneumonia due to coronavirus disease 2019: Secondary | ICD-10-CM

## 2020-01-02 DIAGNOSIS — A4189 Other specified sepsis: Secondary | ICD-10-CM | POA: Diagnosis present

## 2020-01-02 DIAGNOSIS — Z9989 Dependence on other enabling machines and devices: Secondary | ICD-10-CM | POA: Diagnosis not present

## 2020-01-02 DIAGNOSIS — E1140 Type 2 diabetes mellitus with diabetic neuropathy, unspecified: Secondary | ICD-10-CM | POA: Diagnosis present

## 2020-01-02 DIAGNOSIS — E1151 Type 2 diabetes mellitus with diabetic peripheral angiopathy without gangrene: Secondary | ICD-10-CM | POA: Diagnosis present

## 2020-01-02 DIAGNOSIS — J9601 Acute respiratory failure with hypoxia: Secondary | ICD-10-CM | POA: Diagnosis present

## 2020-01-02 DIAGNOSIS — E876 Hypokalemia: Secondary | ICD-10-CM | POA: Diagnosis present

## 2020-01-02 DIAGNOSIS — Z7984 Long term (current) use of oral hypoglycemic drugs: Secondary | ICD-10-CM | POA: Diagnosis not present

## 2020-01-02 DIAGNOSIS — F329 Major depressive disorder, single episode, unspecified: Secondary | ICD-10-CM | POA: Diagnosis present

## 2020-01-02 DIAGNOSIS — I10 Essential (primary) hypertension: Secondary | ICD-10-CM | POA: Diagnosis present

## 2020-01-02 DIAGNOSIS — Z86711 Personal history of pulmonary embolism: Secondary | ICD-10-CM | POA: Diagnosis not present

## 2020-01-02 DIAGNOSIS — U071 COVID-19: Secondary | ICD-10-CM | POA: Diagnosis present

## 2020-01-02 DIAGNOSIS — R5381 Other malaise: Secondary | ICD-10-CM | POA: Diagnosis present

## 2020-01-02 DIAGNOSIS — E785 Hyperlipidemia, unspecified: Secondary | ICD-10-CM | POA: Diagnosis present

## 2020-01-02 DIAGNOSIS — A419 Sepsis, unspecified organism: Secondary | ICD-10-CM

## 2020-01-02 DIAGNOSIS — Z6841 Body Mass Index (BMI) 40.0 and over, adult: Secondary | ICD-10-CM | POA: Diagnosis not present

## 2020-01-02 DIAGNOSIS — Z86718 Personal history of other venous thrombosis and embolism: Secondary | ICD-10-CM | POA: Diagnosis not present

## 2020-01-02 LAB — TROPONIN I (HIGH SENSITIVITY): Troponin I (High Sensitivity): 10 ng/L (ref <18)

## 2020-01-02 LAB — CBC WITH DIFFERENTIAL/PLATELET
Abs Immature Granulocytes: 0.01 10*3/uL (ref 0.00–0.07)
Basophils Absolute: 0 10*3/uL (ref 0.0–0.1)
Basophils Relative: 0 %
Eosinophils Absolute: 0 10*3/uL (ref 0.0–0.5)
Eosinophils Relative: 0 %
HCT: 43.2 % (ref 36.0–46.0)
Hemoglobin: 13.8 g/dL (ref 12.0–15.0)
Immature Granulocytes: 0 %
Lymphocytes Relative: 26 %
Lymphs Abs: 0.9 10*3/uL (ref 0.7–4.0)
MCH: 28.8 pg (ref 26.0–34.0)
MCHC: 31.9 g/dL (ref 30.0–36.0)
MCV: 90.2 fL (ref 80.0–100.0)
Monocytes Absolute: 0.1 10*3/uL (ref 0.1–1.0)
Monocytes Relative: 4 %
Neutro Abs: 2.5 10*3/uL (ref 1.7–7.7)
Neutrophils Relative %: 70 %
Platelets: 179 10*3/uL (ref 150–400)
RBC: 4.79 MIL/uL (ref 3.87–5.11)
RDW: 13.8 % (ref 11.5–15.5)
WBC: 3.6 10*3/uL — ABNORMAL LOW (ref 4.0–10.5)
nRBC: 0 % (ref 0.0–0.2)

## 2020-01-02 LAB — SARS CORONAVIRUS 2 BY RT PCR (HOSPITAL ORDER, PERFORMED IN ~~LOC~~ HOSPITAL LAB): SARS Coronavirus 2: POSITIVE — AB

## 2020-01-02 LAB — BASIC METABOLIC PANEL
Anion gap: 16 — ABNORMAL HIGH (ref 5–15)
BUN: 14 mg/dL (ref 6–20)
CO2: 25 mmol/L (ref 22–32)
Calcium: 8.6 mg/dL — ABNORMAL LOW (ref 8.9–10.3)
Chloride: 97 mmol/L — ABNORMAL LOW (ref 98–111)
Creatinine, Ser: 1.25 mg/dL — ABNORMAL HIGH (ref 0.44–1.00)
GFR calc Af Amer: 56 mL/min — ABNORMAL LOW (ref >60)
GFR calc non Af Amer: 49 mL/min — ABNORMAL LOW (ref >60)
Glucose, Bld: 114 mg/dL — ABNORMAL HIGH (ref 70–99)
Potassium: 3.2 mmol/L — ABNORMAL LOW (ref 3.5–5.1)
Sodium: 138 mmol/L (ref 135–145)

## 2020-01-02 LAB — GLUCOSE, CAPILLARY: Glucose-Capillary: 135 mg/dL — ABNORMAL HIGH (ref 70–99)

## 2020-01-02 LAB — FERRITIN: Ferritin: 371 ng/mL — ABNORMAL HIGH (ref 11–307)

## 2020-01-02 LAB — FIBRINOGEN: Fibrinogen: 545 mg/dL — ABNORMAL HIGH (ref 210–475)

## 2020-01-02 LAB — C-REACTIVE PROTEIN: CRP: 16 mg/dL — ABNORMAL HIGH (ref <1.0)

## 2020-01-02 LAB — LACTATE DEHYDROGENASE: LDH: 385 U/L — ABNORMAL HIGH (ref 98–192)

## 2020-01-02 LAB — MAGNESIUM: Magnesium: 2 mg/dL (ref 1.7–2.4)

## 2020-01-02 LAB — D-DIMER, QUANTITATIVE: D-Dimer, Quant: 0.51 ug/mL-FEU — ABNORMAL HIGH (ref 0.00–0.50)

## 2020-01-02 LAB — PROCALCITONIN: Procalcitonin: <0.1 ng/mL

## 2020-01-02 LAB — ABO/RH: ABO/RH(D): A POS

## 2020-01-02 MED ORDER — VITAMIN D 25 MCG (1000 UNIT) PO TABS
1000.0000 [IU] | ORAL_TABLET | Freq: Every day | ORAL | Status: DC
  Administered 2020-01-02 – 2020-01-07 (×6): 1000 [IU] via ORAL
  Filled 2020-01-02 (×5): qty 1

## 2020-01-02 MED ORDER — LABETALOL HCL 200 MG PO TABS
200.0000 mg | ORAL_TABLET | Freq: Two times a day (BID) | ORAL | Status: DC
  Administered 2020-01-02 – 2020-01-03 (×2): 200 mg via ORAL
  Filled 2020-01-02 (×2): qty 1

## 2020-01-02 MED ORDER — MOMETASONE FURO-FORMOTEROL FUM 200-5 MCG/ACT IN AERO
2.0000 | INHALATION_SPRAY | Freq: Two times a day (BID) | RESPIRATORY_TRACT | Status: DC
  Administered 2020-01-02 – 2020-01-07 (×10): 2 via RESPIRATORY_TRACT
  Filled 2020-01-02: qty 8.8

## 2020-01-02 MED ORDER — INSULIN ASPART 100 UNIT/ML ~~LOC~~ SOLN
0.0000 [IU] | Freq: Three times a day (TID) | SUBCUTANEOUS | Status: DC
  Administered 2020-01-03: 2 [IU] via SUBCUTANEOUS
  Administered 2020-01-03: 3 [IU] via SUBCUTANEOUS
  Administered 2020-01-03: 2 [IU] via SUBCUTANEOUS
  Administered 2020-01-04 (×3): 3 [IU] via SUBCUTANEOUS
  Administered 2020-01-05: 5 [IU] via SUBCUTANEOUS
  Administered 2020-01-05 (×2): 3 [IU] via SUBCUTANEOUS
  Administered 2020-01-06: 5 [IU] via SUBCUTANEOUS
  Administered 2020-01-06: 2 [IU] via SUBCUTANEOUS
  Administered 2020-01-06 – 2020-01-07 (×2): 3 [IU] via SUBCUTANEOUS

## 2020-01-02 MED ORDER — PRAVASTATIN SODIUM 20 MG PO TABS
20.0000 mg | ORAL_TABLET | Freq: Every day | ORAL | Status: DC
  Administered 2020-01-02 – 2020-01-06 (×5): 20 mg via ORAL
  Filled 2020-01-02 (×5): qty 1

## 2020-01-02 MED ORDER — SODIUM CHLORIDE 0.9 % IV SOLN
100.0000 mg | Freq: Every day | INTRAVENOUS | Status: AC
  Administered 2020-01-03 – 2020-01-06 (×4): 100 mg via INTRAVENOUS
  Filled 2020-01-02 (×4): qty 20

## 2020-01-02 MED ORDER — HYDROCOD POLST-CPM POLST ER 10-8 MG/5ML PO SUER
5.0000 mL | Freq: Once | ORAL | Status: AC
  Administered 2020-01-02: 5 mL via ORAL
  Filled 2020-01-02: qty 5

## 2020-01-02 MED ORDER — GUAIFENESIN-DM 100-10 MG/5ML PO SYRP
10.0000 mL | ORAL_SOLUTION | ORAL | Status: DC | PRN
  Administered 2020-01-04 – 2020-01-05 (×7): 10 mL via ORAL
  Filled 2020-01-02 (×7): qty 10

## 2020-01-02 MED ORDER — INSULIN ASPART 100 UNIT/ML ~~LOC~~ SOLN
0.0000 [IU] | Freq: Every day | SUBCUTANEOUS | Status: DC
  Administered 2020-01-03 – 2020-01-06 (×2): 2 [IU] via SUBCUTANEOUS

## 2020-01-02 MED ORDER — ASPIRIN EC 81 MG PO TBEC
81.0000 mg | DELAYED_RELEASE_TABLET | Freq: Every day | ORAL | Status: DC
  Administered 2020-01-02 – 2020-01-07 (×5): 81 mg via ORAL
  Filled 2020-01-02 (×6): qty 1

## 2020-01-02 MED ORDER — ENOXAPARIN SODIUM 60 MG/0.6ML ~~LOC~~ SOLN
60.0000 mg | SUBCUTANEOUS | Status: DC
  Administered 2020-01-02 – 2020-01-07 (×6): 60 mg via SUBCUTANEOUS
  Filled 2020-01-02 (×7): qty 0.6

## 2020-01-02 MED ORDER — ACETAMINOPHEN 325 MG PO TABS
650.0000 mg | ORAL_TABLET | Freq: Once | ORAL | Status: AC
  Administered 2020-01-02: 650 mg via ORAL
  Filled 2020-01-02: qty 2

## 2020-01-02 MED ORDER — GABAPENTIN 300 MG PO CAPS
600.0000 mg | ORAL_CAPSULE | Freq: Every day | ORAL | Status: DC
  Administered 2020-01-02 – 2020-01-03 (×2): 600 mg via ORAL
  Filled 2020-01-02 (×2): qty 2

## 2020-01-02 MED ORDER — OXYCODONE-ACETAMINOPHEN 5-325 MG PO TABS: 1.0000 | ORAL_TABLET | Freq: Four times a day (QID) | ORAL | Status: DC | PRN

## 2020-01-02 MED ORDER — METHYLPREDNISOLONE SODIUM SUCC 125 MG IJ SOLR
0.5000 mg/kg | Freq: Two times a day (BID) | INTRAMUSCULAR | Status: DC
  Administered 2020-01-02 – 2020-01-06 (×9): 61.875 mg via INTRAVENOUS
  Filled 2020-01-02 (×9): qty 2

## 2020-01-02 MED ORDER — HYDROCOD POLST-CPM POLST ER 10-8 MG/5ML PO SUER: 5.0000 mL | Freq: Two times a day (BID) | ORAL | Status: DC | PRN

## 2020-01-02 MED ORDER — ACETAMINOPHEN 325 MG PO TABS: 650.0000 mg | ORAL_TABLET | Freq: Four times a day (QID) | ORAL | Status: DC | PRN

## 2020-01-02 MED ORDER — ZINC SULFATE 220 (50 ZN) MG PO CAPS
220.0000 mg | ORAL_CAPSULE | Freq: Every day | ORAL | Status: DC
  Administered 2020-01-02 – 2020-01-07 (×6): 220 mg via ORAL
  Filled 2020-01-02 (×6): qty 1

## 2020-01-02 MED ORDER — ASCORBIC ACID 500 MG PO TABS
500.0000 mg | ORAL_TABLET | Freq: Every day | ORAL | Status: DC
  Administered 2020-01-02 – 2020-01-07 (×6): 500 mg via ORAL
  Filled 2020-01-02 (×6): qty 1

## 2020-01-02 MED ORDER — LORATADINE 10 MG PO TABS
10.0000 mg | ORAL_TABLET | Freq: Every day | ORAL | Status: DC
  Administered 2020-01-02 – 2020-01-07 (×6): 10 mg via ORAL
  Filled 2020-01-02 (×6): qty 1

## 2020-01-02 MED ORDER — PANTOPRAZOLE SODIUM 40 MG PO TBEC
40.0000 mg | DELAYED_RELEASE_TABLET | Freq: Every day | ORAL | Status: DC
  Administered 2020-01-02 – 2020-01-07 (×6): 40 mg via ORAL
  Filled 2020-01-02 (×6): qty 1

## 2020-01-02 MED ORDER — CYCLOBENZAPRINE HCL 10 MG PO TABS: 10.0000 mg | ORAL_TABLET | Freq: Three times a day (TID) | ORAL | Status: DC | PRN

## 2020-01-02 MED ORDER — SODIUM CHLORIDE 0.9 % IV SOLN
200.0000 mg | Freq: Once | INTRAVENOUS | Status: AC
  Administered 2020-01-02: 200 mg via INTRAVENOUS
  Filled 2020-01-02: qty 200

## 2020-01-02 MED ORDER — DEXAMETHASONE SODIUM PHOSPHATE 10 MG/ML IJ SOLN
6.0000 mg | Freq: Once | INTRAMUSCULAR | Status: AC
  Administered 2020-01-02: 6 mg via INTRAVENOUS
  Filled 2020-01-02: qty 1

## 2020-01-02 MED ORDER — POTASSIUM CHLORIDE CRYS ER 20 MEQ PO TBCR
40.0000 meq | EXTENDED_RELEASE_TABLET | Freq: Once | ORAL | Status: AC
  Administered 2020-01-02: 40 meq via ORAL
  Filled 2020-01-02: qty 2

## 2020-01-02 NOTE — Progress Notes (Signed)
Pt is COVID-19 positive but has documented history of OSA and is compliant with CPAP use at home.  CPAP provided to the Pt and O2 bled into circuit via O2 splitter and dual flowmeters at wall.

## 2020-01-02 NOTE — H&P (Signed)
History and Physical    [name] HEN:[id number] DOB: July 31, 1965 DOA: 01/01/2020  PCP: Sonia Pali, NP Patient coming from: Home  Chief Complaint: Shortness of breath  HPI: [name] is a 54 y.o. female with medical history significant of long QT syndrome, hypertension, OSA on CPAP, history of PE, PVD, GERD, depression presenting with complaints of shortness of breath and cough.  Patient states she received the first dose of Pfizer vaccine about 8 days ago.  She was then exposed to a friend who was Covid positive.  She went for Covid testing 3 days ago and it was positive.  Reports having fevers, chills, fatigue, body aches, cough, shortness of breath, and poor oral intake for the past 3 days.  Reports having slight substernal chest discomfort when she is coughing but not otherwise.  Denies nausea, vomiting, abdominal pain, or diarrhea.  ED Course: Febrile with temperature 100.4 F.  Oxygen saturation as low as 92% on room air.  Tachypneic with respiratory rate in the 20s to 30s.  SARS-CoV-2 PCR test positive.  WBC 3.6, hemoglobin 13.8, hematocrit 43.2, platelet 179.  Sodium 138, potassium 3.2, chloride 97, bicarb 25, BUN 14, creatinine 1.2, glucose 114.  Chest x-ray showing bilateral patchy airspace disease consistent with multifocal pneumonia.  Patient was given Tylenol, Decadron, remdesivir, Tussionex, and 40 mEq p.o. potassium.  Review of Systems:  All systems reviewed and apart from history of presenting illness, are negative.  Past Medical History:  Diagnosis Date  . Anemia   . Anxiety   . Arthritis    osteoarthritis  . Atypical chest pain    negative Myoview in 2011 with no ischemi and normal LV function.   . Benign neoplasm of colon   . Depression   . DVT (deep venous thrombosis) ([place])   . Dyspnea   . GERD (gastroesophageal reflux disease)    uses Nexium  . HTN (hypertension)   . Long Q-T syndrome   . Obesity   . OSA (obstructive sleep apnea)    uses  C-PAP  . PE (pulmonary embolism)   . Peripheral vascular disease ([place])    blood clot in right leg  . Prolonged QT interval    gene positive  . Seizures ([place])    hasn't had any for 25 years    Past Surgical History:  Procedure Laterality Date  . ANTERIOR CERVICAL DECOMP/DISCECTOMY FUSION N/A 06/25/2013   Procedure: ANTERIOR CERVICAL DECOMPRESSION/DISCECTOMY FUSION 2 LEVELS LEVELS C FOUR/FIVE, SIX/SEVEN;  Surgeon: Floyce Stakes, MD;  Location: [facility];  Service: Neurosurgery;  Laterality: N/A;  . ANTERIOR CERVICAL DECOMP/DISCECTOMY FUSION N/A 03/29/2016   Procedure: CERVICAL THREE-FOUR, CERVICAL FOUR-FIVE ANTERIOR CERVICAL DECOMPRESSION/DISCECTOMY/FUSION WITH REMOVAL OF PLATES AT T6-1 W4-3;  Surgeon: Leeroy Cha, MD;  Location: [facility];  Service: Neurosurgery;  Laterality: N/A;  C3-4 C5-6 ANTERIOR CERVICAL DECOMPRESSION/DISCECTOMY/FUSION WITH POSSIBLE REMOVAL OF PLATE AT X5-4 M0-8  . BACK SURGERY    . CARDIAC CATHETERIZATION    . COLONOSCOPY    . COLONOSCOPY WITH PROPOFOL N/A 01/16/2018   Procedure: COLONOSCOPY WITH PROPOFOL;  Surgeon: Lollie Sails, MD;  Location: [facility] ENDOSCOPY;  Service: Endoscopy;  Laterality: N/A;  . DILATION AND CURETTAGE OF UTERUS    . ESOPHAGOGASTRODUODENOSCOPY (EGD) WITH PROPOFOL N/A 11/17/2015   Procedure: ESOPHAGOGASTRODUODENOSCOPY (EGD) WITH PROPOFOL;  Surgeon: Lollie Sails, MD;  Location: [facility] ENDOSCOPY;  Service: Endoscopy;  Laterality: N/A;  . ESOPHAGOGASTRODUODENOSCOPY (EGD) WITH PROPOFOL N/A 01/16/2018   Procedure: ESOPHAGOGASTRODUODENOSCOPY (EGD) WITH PROPOFOL;  Surgeon:  Lollie Sails, MD;  Location: [facility] ENDOSCOPY;  Service: Endoscopy;  Laterality: N/A;  . JOINT REPLACEMENT  04/2006   hip  . left foot surgery    . left hand surgery x 2    . LUMBAR DISC SURGERY    . right hand surgery for torn ligament    . TOTAL HIP ARTHROPLASTY       reports that she quit smoking about 18 years ago. She has never used smokeless tobacco. She  reports current alcohol use of about 2.0 standard drinks of alcohol per week. She reports previous drug use.  No Known Allergies  Family History  Problem Relation Age of Onset  . Long QT syndrome Mother   . CAD Mother   . Diabetes Father   . CAD Father   . Diabetes Brother   . Breast cancer Maternal Aunt 70  . Breast cancer Paternal Aunt   . Breast cancer Maternal Aunt 40    Prior to Admission medications   Medication Sig Start Date End Date Taking? Authorizing Provider  albuterol (PROVENTIL HFA;VENTOLIN HFA) 108 (90 BASE) MCG/ACT inhaler Inhale 1-2 puffs into the lungs every 4 (four) hours as needed for wheezing or shortness of breath.    [provider]  azithromycin (ZITHROMAX) 250 MG tablet Take 250 mg by mouth daily. 12/30/19   [provider]  budesonide-formoterol (SYMBICORT) 160-4.5 MCG/ACT inhaler Inhale 2 puffs into the lungs 2 (two) times daily as needed (for shortness of breath).    [provider]  cyclobenzaprine (FLEXERIL) 10 MG tablet Take 0.5 tablets (5 mg total) by mouth 3 (three) times daily as needed for muscle spasms. 04/06/17   Charlann Lange, PA-C  escitalopram (LEXAPRO) 20 MG tablet Take 20 mg by mouth daily.  10/03/13   [provider]  esomeprazole (NEXIUM) 40 MG capsule Take 1 capsule (40 mg total) by mouth at bedtime. 1/69/67   Delora Fuel, MD  Ferrous Sulfate (SLOW RELEASE IRON PO) Take 142 mg by mouth daily.     [provider]  fluocinonide (LIDEX) 0.05 % external solution Apply 1 application topically 2 (two) times daily. 12/30/11   [provider]  gabapentin (NEURONTIN) 600 MG tablet Take 600 mg by mouth in the morning, at noon, and at bedtime. 12/13/19   [provider]  Glucosamine-Chondroit-Vit C-Mn (GLUCOSAMINE 1500 COMPLEX) CAPS Take 1 each by mouth daily.    [provider]  hyoscyamine (LEVSIN, ANASPAZ) 0.125 MG tablet Take 0.125 mg by mouth daily.    [provider]   ketorolac (TORADOL) 10 MG tablet Take 1 tablet (10 mg total) by mouth every 8 (eight) hours as needed. 09/15/18   Karen Kitchens, NP  labetalol (NORMODYNE) 200 MG tablet Take 1 tablet (200 mg total) by mouth 2 (two) times daily. Please keep upcoming appt with Dr. Caryl Comes in January before anymore refills. Thank you 03/15/19   Deboraha Sprang, MD  lisinopril (ZESTRIL) 2.5 MG tablet Take 2.5 mg by mouth daily. 12/22/19   [provider]  loratadine (CLARITIN) 10 MG tablet Take 10 mg by mouth daily.    [provider]  metFORMIN (GLUCOPHAGE) 500 MG tablet Take 500 mg by mouth daily. 12/22/19   [provider]  methylPREDNISolone (MEDROL DOSEPAK) 4 MG TBPK tablet Taper per package instructions. 09/15/18   Karen Kitchens, NP  Multiple Vitamins-Minerals (MULTIVITAMIN WITH MINERALS) tablet Take 1 tablet by mouth daily.    [provider]  nabumetone (RELAFEN) 750 MG tablet  Take 1 tablet (750 mg total) by mouth 2 (two) times daily. 02/23/16   Glean Hess, MD  Omega-3 Fatty Acids (FISH OIL) 1000 MG CAPS Take 1 capsule by mouth 3 (three) times daily.      [provider]  potassium chloride SA (K-DUR,KLOR-CON) 20 MEQ tablet Take 1 tablet (20 mEq total) by mouth daily. 04/08/14   Nahser, Wonda Cheng, MD  spironolactone (ALDACTONE) 25 MG tablet Take 1 tablet (25 mg total) by mouth daily. 05/08/14   Deboraha Sprang, MD  triamterene-hydrochlorothiazide (MAXZIDE) 75-50 MG tablet Take 1 tablet by mouth every evening.  01/10/17   [provider]  Vitamin D, Ergocalciferol, (DRISDOL) 1.25 MG (50000 UNIT) CAPS capsule Take 50,000 Units by mouth once a week. 11/10/19   [provider]  zolpidem (AMBIEN) 10 MG tablet Take 10 mg by mouth at bedtime as needed for sleep.    [provider]    Physical Exam: Vitals:   01/02/20 0119 01/02/20 0330 01/02/20 0400 01/02/20 0416  BP:  104/81 (!) 111/97   Pulse:  89 91 88  Resp: (!) 29 (!) 28 (!) 29 (!) 25  Temp:   98.8 F (37.1 C)    TempSrc:  Oral    SpO2:  92% 90% 93%  Weight:      Height:        Physical Exam Constitutional:      General: She is not in acute distress. HENT:     Head: Normocephalic and atraumatic.  Eyes:     Extraocular Movements: Extraocular movements intact.     Conjunctiva/sclera: Conjunctivae normal.  Cardiovascular:     Rate and Rhythm: Normal rate and regular rhythm.     Pulses: Normal pulses.  Pulmonary:     Breath sounds: No wheezing.     Comments: Respiratory rate in the low 20s Oxygen saturation 90-92% on room air Rales appreciated at the right lung base Abdominal:     General: Bowel sounds are normal.     Palpations: Abdomen is soft.     Tenderness: There is no abdominal tenderness. There is no guarding.  Musculoskeletal:        General: No tenderness.     Cervical back: Normal range of motion and neck supple.     Comments: Mild edema at the level of the ankles bilaterally  Skin:    General: Skin is warm and dry.  Neurological:     General: No focal deficit present.     Mental Status: She is alert and oriented to person, place, and time.     Labs on Admission: I have personally reviewed following labs and imaging studies  CBC: Recent Labs  Lab 01/01/20 2358  WBC 3.6*  NEUTROABS 2.5  HGB 13.8  HCT 43.2  MCV 90.2  PLT 660   Basic Metabolic Panel: Recent Labs  Lab 01/01/20 2358  NA 138  K 3.2*  CL 97*  CO2 25  GLUCOSE 114*  BUN 14  CREATININE 1.25*  CALCIUM 8.6*   GFR: Estimated Creatinine Clearance: 67 mL/min (A) (by C-G formula based on SCr of 1.25 mg/dL (H)). Liver Function Tests: No results for input(s): AST, ALT, ALKPHOS, BILITOT, PROT, ALBUMIN in the last 168 hours. No results for input(s): LIPASE, AMYLASE in the last 168 hours. No results for input(s): AMMONIA in the last 168 hours. Coagulation Profile: No results for input(s): INR, PROTIME in the last 168 hours. Cardiac Enzymes: No results for input(s): CKTOTAL,  CKMB, CKMBINDEX, TROPONINI  in the last 168 hours. BNP (last 3 results) No results for input(s): PROBNP in the last 8760 hours. HbA1C: No results for input(s): HGBA1C in the last 72 hours. CBG: No results for input(s): GLUCAP in the last 168 hours. Lipid Profile: No results for input(s): CHOL, HDL, LDLCALC, TRIG, CHOLHDL, LDLDIRECT in the last 72 hours. Thyroid Function Tests: No results for input(s): TSH, T4TOTAL, FREET4, T3FREE, THYROIDAB in the last 72 hours. Anemia Panel: No results for input(s): VITAMINB12, FOLATE, FERRITIN, TIBC, IRON, RETICCTPCT in the last 72 hours. Urine analysis:    Component Value Date/Time   COLORURINE YELLOW 04/04/2016 1813   APPEARANCEUR CLEAR 04/04/2016 1813   APPEARANCEUR Hazy 03/09/2014 1217   LABSPEC 1.020 04/04/2016 1813   LABSPEC 1.030 03/09/2014 1217   PHURINE 6.5 04/04/2016 1813   GLUCOSEU NEGATIVE 04/04/2016 1813   GLUCOSEU Negative 03/09/2014 1217   HGBUR NEGATIVE 04/04/2016 1813   BILIRUBINUR NEGATIVE 04/04/2016 1813   BILIRUBINUR 1 06/11/2015 1248   BILIRUBINUR Negative 03/09/2014 1217   KETONESUR NEGATIVE 04/04/2016 1813   PROTEINUR NEGATIVE 04/04/2016 1813   UROBILINOGEN 0.2 06/11/2015 1248   NITRITE NEGATIVE 04/04/2016 1813   LEUKOCYTESUR NEGATIVE 04/04/2016 1813   LEUKOCYTESUR Negative 03/09/2014 1217    Radiological Exams on Admission: DG Chest Port 1 View  Result Date: 01/02/2020 CLINICAL DATA:  COVID-19 positive, short of breath EXAM: PORTABLE CHEST 1 VIEW COMPARISON:  03/16/2016 FINDINGS: Single frontal view of the chest demonstrates an unremarkable cardiac silhouette. The thoracic aorta is ectatic. There are scattered bilateral areas of airspace disease, with relative sparing of the left upper lung zone. No effusion or pneumothorax. Postsurgical changes cervical spine. No acute bony abnormalities. IMPRESSION: 1. Bilateral patchy airspace disease consistent with multifocal pneumonia. Pattern is consistent with COVID-19.  Electronically Signed   By: Randa Ngo M.D.   On: 01/02/2020 00:30    Assessment/Plan Principal Problem:   Pneumonia due to COVID-19 virus Active Problems:   OSA on CPAP   Long Q-T syndrome   Sepsis ([place])   Hypokalemia   Sepsis and acute hypoxemic respiratory failure secondary to COVID-19 viral multifocal pneumonia: Febrile with temperature 100.4 F.  Mildly tachypneic with respiratory rate in the low 20s.  Currently satting 90-92 % on room air.  SARS-CoV-2 PCR test positive.  She received the first dose of Pfizer vaccine a week ago.  Chest x-ray showing bilateral patchy airspace disease consistent with multifocal pneumonia.  Bacterial pneumonia less likely given no leukocytosis. -Remdesivir dosing per pharmacy -IV Solu-Medrol 0.5 mg/kg every 12 hours -Will hold off IV fluid bolus as patient is not tachycardic or hypotensive.  Avoid volume overload given COVID-19 pneumonia. -Vitamin C, zinc, vitamin D -Antitussives as needed -Tylenol as needed -Check inflammatory markers including ferritin, fibrinogen, D-dimer, CRP, LDH -Check procalcitonin level -Daily CBC with differential, CMP, CRP, D-dimer, LDH -Incentive spirometry, flutter valve -Encourage prone positioning -Airborne and contact precautions -Continuous pulse ox -Supplemental oxygen as needed to keep oxygen saturation above 90% -Blood culture x2 ordered  Mild hypokalemia: Potassium 3.2.  Likely due to home diuretic use. -Hold diuretics.  Potassium repletion.  Check magnesium level and replete if low.  Continue to monitor electrolytes.  Chest pain: Patient reports having slight substernal chest discomfort when coughing but not otherwise. -Cardiac monitoring, EKG, troponin  History of long QT syndrome -Cardiac monitoring, EKG  Hypertension: Currently normotensive. -Resume home meds after pharmacy med rec is done.  OSA -CPAP at night  Pharmacy med rec pending.  DVT prophylaxis: Lovenox Code Status: Full  code Family Communication: No family available at this time. Disposition Plan: Status is: Inpatient  Remains inpatient appropriate because:IV treatments appropriate due to intensity of illness or inability to take PO and Inpatient level of care appropriate due to severity of illness   Dispo: The patient is from: Home              Anticipated d/c is to: Home              Anticipated d/c date is: 3 days              Patient currently is not medically stable to d/c.  The medical decision making on this patient was of high complexity and the patient is at high risk for clinical deterioration, therefore this is a level 3 visit.  Shela Leff MD [facility]  If 7PM-7AM, please contact night-coverage [email or web address]  01/02/2020, 4:42 AM

## 2020-01-02 NOTE — Progress Notes (Addendum)
Patient admitted to the hospital earlier this morning by Dr. Marlowe Sax  Patient seen and examined.  She continues to cough and feels short of breath.  Lungs are clear to auscultation bilaterally.  Oxygen at 2 L was applied earlier today.  A/P:   1.  Acute respiratory failure secondary to COVID-19 pneumonia.  Patient is noted to have elevated inflammatory markers.  His CRP is 16.  She has bilateral infiltrates on chest x-ray.  She does feel short of breath and is coughing.  Oxygen at 2 L was applied earlier today.  She will need continued monitoring at this time to ensure that her respiratory status remained stable and she does not have increasing oxygen requirements.  She has been started on remdesivir and intravenous steroids.  If respiratory status is stabilized on 2 L of oxygen, can potentially discharge home on the same and arrange for outpatient remdesivir infusions.  Will need to reassess in the next 24 hours.  2.  Mild hypokalemia.  Replace.  Magnesium normal  3. Hypertension.  Currently normotensive.  Continue on labetalol.  Hold lisinopril and diuretics.  4.  Obstructive sleep apnea.  Continue on nightly CPAP.  5.  Non-insulin-dependent diabetes.  Chronically on Ozempic and Metformin.  We will continue to hold in the hospital and supplement with sliding scale.  Follow blood sugars.  6.  Diabetic neuropathy.  Continue gabapentin  7.  Chronic pain.  Continue on oxycodone as taken at home  8. GERD.  Continue PPI  9. Morbid Obesity. Counseled on importance of diet and exercise  [employer]

## 2020-01-02 NOTE — ED Notes (Signed)
Pt placed on 2L of O2 via [place]

## 2020-01-02 NOTE — Progress Notes (Signed)
Patient arrives to room 1527 at this time via stretcher from ED.  Patient required assist of 1 from stretcher to bed.  Patient oriented to callbell use and bed controls with stated understanding

## 2020-01-03 ENCOUNTER — Inpatient Hospital Stay (HOSPITAL_COMMUNITY): Payer: Medicare HMO

## 2020-01-03 LAB — HEMOGLOBIN A1C
Hgb A1c MFr Bld: 6.3 % — ABNORMAL HIGH (ref 4.8–5.6)
Mean Plasma Glucose: 134.11 mg/dL

## 2020-01-03 LAB — COMPREHENSIVE METABOLIC PANEL
ALT: 45 U/L — ABNORMAL HIGH (ref 0–44)
AST: 78 U/L — ABNORMAL HIGH (ref 15–41)
Albumin: 3.2 g/dL — ABNORMAL LOW (ref 3.5–5.0)
Alkaline Phosphatase: 66 U/L (ref 38–126)
Anion gap: 12 (ref 5–15)
BUN: 20 mg/dL (ref 6–20)
CO2: 26 mmol/L (ref 22–32)
Calcium: 8.6 mg/dL — ABNORMAL LOW (ref 8.9–10.3)
Chloride: 99 mmol/L (ref 98–111)
Creatinine, Ser: 0.87 mg/dL (ref 0.44–1.00)
GFR calc Af Amer: >60 mL/min (ref >60)
GFR calc non Af Amer: >60 mL/min (ref >60)
Glucose, Bld: 144 mg/dL — ABNORMAL HIGH (ref 70–99)
Potassium: 3.4 mmol/L — ABNORMAL LOW (ref 3.5–5.1)
Sodium: 137 mmol/L (ref 135–145)
Total Bilirubin: 0.4 mg/dL (ref 0.3–1.2)
Total Protein: 7.1 g/dL (ref 6.5–8.1)

## 2020-01-03 LAB — C-REACTIVE PROTEIN: CRP: 10.6 mg/dL — ABNORMAL HIGH (ref <1.0)

## 2020-01-03 LAB — GLUCOSE, CAPILLARY
Glucose-Capillary: 127 mg/dL — ABNORMAL HIGH (ref 70–99)
Glucose-Capillary: 139 mg/dL — ABNORMAL HIGH (ref 70–99)
Glucose-Capillary: 166 mg/dL — ABNORMAL HIGH (ref 70–99)
Glucose-Capillary: 210 mg/dL — ABNORMAL HIGH (ref 70–99)

## 2020-01-03 LAB — MAGNESIUM: Magnesium: 2.2 mg/dL (ref 1.7–2.4)

## 2020-01-03 LAB — CBC WITH DIFFERENTIAL/PLATELET
Abs Immature Granulocytes: 0.02 10*3/uL (ref 0.00–0.07)
Basophils Absolute: 0 10*3/uL (ref 0.0–0.1)
Basophils Relative: 0 %
Eosinophils Absolute: 0 10*3/uL (ref 0.0–0.5)
Eosinophils Relative: 0 %
HCT: 40 % (ref 36.0–46.0)
Hemoglobin: 12.5 g/dL (ref 12.0–15.0)
Immature Granulocytes: 0 %
Lymphocytes Relative: 16 %
Lymphs Abs: 0.8 10*3/uL (ref 0.7–4.0)
MCH: 28.3 pg (ref 26.0–34.0)
MCHC: 31.3 g/dL (ref 30.0–36.0)
MCV: 90.7 fL (ref 80.0–100.0)
Monocytes Absolute: 0.3 10*3/uL (ref 0.1–1.0)
Monocytes Relative: 6 %
Neutro Abs: 4.1 10*3/uL (ref 1.7–7.7)
Neutrophils Relative %: 78 %
Platelets: 176 10*3/uL (ref 150–400)
RBC: 4.41 MIL/uL (ref 3.87–5.11)
RDW: 13.9 % (ref 11.5–15.5)
WBC: 5.2 10*3/uL (ref 4.0–10.5)
nRBC: 0 % (ref 0.0–0.2)

## 2020-01-03 LAB — HIV ANTIBODY (ROUTINE TESTING W REFLEX): HIV Screen 4th Generation wRfx: NONREACTIVE

## 2020-01-03 LAB — D-DIMER, QUANTITATIVE: D-Dimer, Quant: 0.41 ug/mL-FEU (ref 0.00–0.50)

## 2020-01-03 MED ORDER — POTASSIUM CHLORIDE IN NACL 20-0.9 MEQ/L-% IV SOLN
INTRAVENOUS | Status: AC
  Filled 2020-01-03 (×3): qty 1000

## 2020-01-03 MED ORDER — GABAPENTIN 300 MG PO CAPS
300.0000 mg | ORAL_CAPSULE | Freq: Every day | ORAL | Status: DC
  Administered 2020-01-04 – 2020-01-07 (×4): 300 mg via ORAL
  Filled 2020-01-03 (×4): qty 1

## 2020-01-03 MED ORDER — PROCHLORPERAZINE EDISYLATE 10 MG/2ML IJ SOLN
10.0000 mg | Freq: Four times a day (QID) | INTRAMUSCULAR | Status: DC | PRN
  Administered 2020-01-03: 10 mg via INTRAVENOUS
  Filled 2020-01-03: qty 2

## 2020-01-03 NOTE — Progress Notes (Signed)
CCMD called charge nurse Mollie [place] and informed her that patient had a 7 beat run of VT. Kasia and I went and checked on patient. She was alert and would answer Kasia questions. She is still very sleepy. She was asymptomatic. AMION page sent to Dr. [place] Dr. [place] stated to continue monitor patient, he has ordered a magnesium level to be completed now. I will continue to monitor patient.

## 2020-01-03 NOTE — Progress Notes (Signed)
Paged on-call attending to see if patient needed to be on continuous pulse ox. Continuous pulse ox will be needed if patient becomes unstable with her breathing or have a significant change on tele monitor. Make sure patient is wearing CPAP at night. Patient is 88% 2L of O2 via [place], 94% with CPAP on. Will continue to monitor the patient.

## 2020-01-03 NOTE — Significant Event (Signed)
Rapid Response Event Note   Reason for Call :  Nurse called RR after patient got up to go to the bathroom and became diaphoretic, tachypneic at 30 breaths per minute, and sats dropped from 93 to 90%. Nurse reports that patient became increasingly lethargic.   Initial Focused Assessment:  Upon assessment, patient is noted to be quite drowsy. She will wake up to her name, and correctly answers all orientation questions. Patient does state that her neurontin makes her sleepy, no other sedatives administered. Respirations even and unlabored. Patient satting 91% on 4L nasal cannula. MD alerted and already aware of the situation.   Interventions:  Chest xray ordered by MD, fluids started  Plan of Care:  Encouraged nursing staff to closely monitor changes in mental status and alert MD and rapid response as needed. Encouraged staff to leave pulse ox on when ambulating patient to the bathroom as our COVID patients are known to [place] so this needs to be closely monitored during ambulation. Encouraged nursing staff to call rapid response with any other concerning changes.      Event Summary:   MD Notified: [place]  Call Time: [id number] Arrival Time: 7034 End Time: 0352  Clarene Critchley, RN

## 2020-01-03 NOTE — Progress Notes (Addendum)
PROGRESS NOTE    [name]  OEV:[id number] DOB: 1965-10-27 DOA: 01/01/2020 PCP: [name], NP    Brief Narrative:  [name] is a 54 year old female with past medical history notable for long QT syndrome, HTN, OSA on CPAP, history of PE, PVD, GERD, depression who presented with shortness of breath and cough.  Also associated fevers, chills, fatigue, body aches, poor oral intake.  Recently obtained her first dose of Pfizer vaccine 8 days prior.  Patient with recent exposure to Covid positive person.  Covid testing 3 days ago was positive.  In the ED, febrile with temperature 100.4 F.  Oxygen saturation as low as 92% on room air.  Tachypneic with respiratory rate in the 20s to 30s. SARS-CoV-2 PCR test positive.  WBC 3.6, hemoglobin 13.8, hematocrit 43.2, platelet 179.  Sodium 138, potassium 3.2, chloride 97, bicarb 25, BUN 14, creatinine 1.2, glucose 114. Chest x-ray showing bilateral patchy airspace disease consistent with multifocal pneumonia. Patient was given Tylenol, Decadron, remdesivir, Tussionex, and 40 mEq p.o. potassium.  Assessment & Plan:   Principal Problem:   OSA on CPAP Active Problems:   Long Q-T syndrome   Pneumonia due to COVID-19 virus   Sepsis (HCC)   Hypokalemia   Sepsis, POA Acute hypoxic respiratory failure secondary to acute Covid-19 viral pneumonia during the ongoing 2020/2021 Covid 19 Pandemic - POA Patient presenting with progressive shortness of breath, fevers, chills and myalgias.  Patient was noted to have a temperature of 100.4 F tachypnea with hypoxia requiring supplemental oxygen.  Recently received first dose Pfizer vaccine 1 week prior with known Covid exposure thereafter.  Procalcitonin less than 0.10.  Chest x-ray with multifocal pneumonia. --COVID test: PCR + 01/01/2020 --CRP 16.0>10.6 --ddimer 0.51>0.41 --Remdesivir, plan 5-day course (Day #2/5) --Solumedrol 60 mg IV every 12 hours --prone for 2-3hrs every 12hrs if  able --Continue supplemental oxygen, titrate to maintain SPO2 greater than 92%, currently on 4 L nasal cannula with SPO2 89% --Continue supportive care with albuterol MDI prn, vitamin C, zinc, Tylenol, antitussives (benzonatate/ Mucinex/Tussionex) --Incentive spirometry/flutter valve --Follow CBC, CMP, D-dimer, ferritin, and CRP daily --Continue airborne/contact isolation precautions for 3 weeks from the day of diagnosis  The treatment plan and use of medications and known side effects were discussed with patient/family. Some of the medications used are based on case reports/anecdotal data.  All other medications being used in the management of COVID-19 based on limited study data.  Complete risks and long-term side effects are unknown, however in the best clinical judgment they seem to be of some benefit.  Patient wanted to proceed with treatment options provided.  Hypokalemia:  Etiology likely secondary to home diuretic use versus poor oral intake.  Was repleted on admission. --Follow electrolytes daily to include magnesium  Type 2 diabetes mellitus Hemoglobin A1c 6.3, well controlled.  On Metformin 500 mg p.o. daily, Ozempic 1mg  [place] q7d at home. --Hold oral diabetic regimen while inpatient --Insulin sliding scale for coverage --CBGs before every meal/at bedtime  History of long QT syndrome QTc 530. --Avoid QT prolonging agents --Continue to monitor on telemetry   Hypertension:  On lisinopril 2.5 mg p.o. daily and spironolactone 25 mg p.o. daily, and labetalol 200 mg p.o. twice daily, and triamterene-HCTZ 75-50 mg nightly at home. --Borderline hypotensive, 102/69 this morning. --Hold home antihypertensives --Continue aspirin and statin  Hyperlipidemia: Continue pravastatin 20 mg p.o. daily  GERD: Continue PPI  OSA --CPAP at night  Chronic pain syndrome --Continue home oxycodone-acetaminophen 5--325 mg every  6 hours as needed --Reduce home gabapentin from 600 to 300 mg p.o.  daily  DVT prophylaxis: Lovenox Code Status: Full code Family Communication: Updated patient extensively at bedside  Disposition Plan:  Status is: Inpatient  Remains inpatient appropriate because:Hemodynamically unstable, Altered mental status, Ongoing diagnostic testing needed not appropriate for outpatient work up, Unsafe d/c plan, IV treatments appropriate due to intensity of illness or inability to take PO and Inpatient level of care appropriate due to severity of illness   Dispo: The patient is from: Home              Anticipated d/c is to: Home              Anticipated d/c date is: 3 days              Patient currently is not medically stable to d/c.   Consultants:   None  Procedures:   None  Antimicrobials:   Remdesivir 9/2>>   Subjective: Patient seen and examined bedside, resting comfortably.  Continues with dyspnea at rest, weakness, fatigue and generalized ill feeling.  Poor appetite.  No other complaints or concerns at this time.  Denies headache, no dizziness, no chest pain, no palpitations, no abdominal pain.  No acute events overnight nursing staff.  Notified by nursing, patient with episode of vomiting.  Rapid response initiated for increased lethargy, patient is hemodynamically stable but blood pressure slightly lower at 96/57.  Review of EMR notes she is continued on one of her antihypertensives; labetalol, which I will discontinue.  Will start on Compazine given her history of QT prolongation and IV fluid hydration.  Patient otherwise has remained on 4 L nasal cannula which she was on this morning.  We will also obtain chest x-ray.  Objective: Vitals:   01/03/20 0859 01/03/20 0920 01/03/20 1115 01/03/20 1147  BP: 113/66  101/70 (!) 96/57  Pulse: 80 81 72 70  Resp:   (!) 30 (!) 30  Temp:   98 F (36.7 C)   TempSrc:   Oral   SpO2: 91% 90% 93% 100%  Weight:      Height:        Intake/Output Summary (Last 24 hours) at 01/03/2020 1232 Last data filed at  01/02/2020 2200 Gross per 24 hour  Intake 240 ml  Output --  Net 240 ml   Filed Weights   01/01/20 1940  Weight: 124.3 kg    Examination:  General exam: Appears calm and comfortable  Respiratory system: Coarse breath sounds bilaterally, slightly decreased bases, normal respiratory effort, on 4 L nasal cannula with SPO2 89% no accessory muscle use. Cardiovascular system: S1 & S2 heard, RRR. No JVD, murmurs, rubs, gallops or clicks. No pedal edema. Gastrointestinal system: Abdomen is nondistended, soft and nontender. No organomegaly or masses felt. Normal bowel sounds heard. Central nervous system: Alert and oriented. No focal neurological deficits. Extremities: Symmetric 5 x 5 power. Skin: No rashes, lesions or ulcers Psychiatry: Judgement and insight appear normal.  Depressed mood, flat affect.    Data Reviewed: I have personally reviewed following labs and imaging studies  CBC: Recent Labs  Lab 01/01/20 2358 01/03/20 0408  WBC 3.6* 5.2  NEUTROABS 2.5 4.1  HGB 13.8 12.5  HCT 43.2 40.0  MCV 90.2 90.7  PLT 179 240   Basic Metabolic Panel: Recent Labs  Lab 01/01/20 2358 01/02/20 0635 01/03/20 0408  NA 138  --  137  K 3.2*  --  3.4*  CL 97*  --  99  CO2 25  --  26  GLUCOSE 114*  --  144*  BUN 14  --  20  CREATININE 1.25*  --  0.87  CALCIUM 8.6*  --  8.6*  MG  --  2.0  --    GFR: Estimated Creatinine Clearance: 96.3 mL/min (by C-G formula based on SCr of 0.87 mg/dL). Liver Function Tests: Recent Labs  Lab 01/03/20 0408  AST 78*  ALT 45*  ALKPHOS 66  BILITOT 0.4  PROT 7.1  ALBUMIN 3.2*   No results for input(s): LIPASE, AMYLASE in the last 168 hours. No results for input(s): AMMONIA in the last 168 hours. Coagulation Profile: No results for input(s): INR, PROTIME in the last 168 hours. Cardiac Enzymes: No results for input(s): CKTOTAL, CKMB, CKMBINDEX, TROPONINI in the last 168 hours. BNP (last 3 results) No results for input(s): PROBNP in the last  8760 hours. HbA1C: Recent Labs    01/03/20 0408  HGBA1C 6.3*   CBG: Recent Labs  Lab 01/02/20 2153 01/03/20 0817 01/03/20 1111  GLUCAP 135* 127* 139*   Lipid Profile: No results for input(s): CHOL, HDL, LDLCALC, TRIG, CHOLHDL, LDLDIRECT in the last 72 hours. Thyroid Function Tests: No results for input(s): TSH, T4TOTAL, FREET4, T3FREE, THYROIDAB in the last 72 hours. Anemia Panel: Recent Labs    01/02/20 0616  FERRITIN 371*   Sepsis Labs: Recent Labs  Lab 01/02/20 0635  PROCALCITON <0.10    Recent Results (from the past 240 hour(s))  SARS Coronavirus 2 by RT PCR (hospital order, performed in [facility] hospital lab) Nasopharyngeal Nasopharyngeal Swab     Status: Abnormal   Collection Time: 01/01/20 11:58 PM   Specimen: Nasopharyngeal Swab  Result Value Ref Range Status   SARS Coronavirus 2 POSITIVE (A) NEGATIVE Final    Comment: RESULT CALLED TO, READ BACK BY AND VERIFIED WITH: RN B BROOKS AT 0209 01/02/20 CRUICKSHANK A (NOTE) SARS-CoV-2 target nucleic acids are DETECTED  SARS-CoV-2 RNA is generally detectable in upper respiratory specimens  during the acute phase of infection.  Positive results are indicative  of the presence of the identified virus, but do not rule out bacterial infection or co-infection with other pathogens not detected by the test.  Clinical correlation with patient history and  other diagnostic information is necessary to determine patient infection status.  The expected result is negative.  Fact Sheet for Patients:   StrictlyIdeas.no   Fact Sheet for Healthcare Providers:   BankingDealers.co.za    This test is not yet approved or cleared by the [place] FDA and  has been authorized for detection and/or diagnosis of SARS-CoV-2 by FDA under an Emergency Use Authorization (EUA).  This EUA will remain in effect (meaning t his test can be used) for the duration of  the COVID-19 declaration  under Section 564(b)(1) of the Act, 21 U.S.C. section 360-bbb-3(b)(1), unless the authorization is terminated or revoked sooner.  Performed at [facility], [place] [address] [place], [place] [address]   Culture, blood (Routine X 2) w Reflex to ID Panel     Status: None (Preliminary result)   Collection Time: 01/02/20  6:35 AM   Specimen: BLOOD  Result Value Ref Range Status   Specimen Description   Final    BLOOD LEFT ANTECUBITAL Performed at [place] [address], [place],  [address]    Special Requests   Final    BOTTLES DRAWN AEROBIC AND ANAEROBIC Blood Culture results may not be optimal  due to an inadequate volume of blood received in culture bottles Performed at [place] [address], [place], [place] [address]    Culture   Final    NO GROWTH 1 DAY Performed at [facility], [place] [address], [place], [place] [address]    Report Status PENDING  Incomplete  Culture, blood (Routine X 2) w Reflex to ID Panel     Status: None (Preliminary result)   Collection Time: 01/02/20  7:07 PM   Specimen: BLOOD  Result Value Ref Range Status   Specimen Description   Final    BLOOD RIGHT ANTECUBITAL Performed at [place] [address], [place], [place] [address]    Special Requests   Final    BOTTLES DRAWN AEROBIC AND ANAEROBIC Blood Culture adequate volume Performed at [place] [address] [place], [place] [address]    Culture   Final    NO GROWTH < 12 HOURS Performed at [place] [address], [place], [place] [address]    Report Status PENDING  Incomplete         Radiology Studies: DG CHEST PORT 1 VIEW  Result Date: 01/03/2020 CLINICAL DATA:  Shortness of breath, COVID-19. EXAM: PORTABLE CHEST 1 VIEW COMPARISON:  January 02, 2020. FINDINGS: The heart size and mediastinal contours are within normal limits. No pneumothorax or pleural  effusion is noted. Bilateral lung opacities are noted, right greater than left, consistent with multifocal pneumonia. The visualized skeletal structures are unremarkable. IMPRESSION: Bilateral lung opacities are noted, right greater than left, consistent with multifocal pneumonia. Electronically Signed   By: Marijo Conception M.D.   On: 01/03/2020 11:56   DG Chest Port 1 View  Result Date: 01/02/2020 CLINICAL DATA:  COVID-19 positive, short of breath EXAM: PORTABLE CHEST 1 VIEW COMPARISON:  03/16/2016 FINDINGS: Single frontal view of the chest demonstrates an unremarkable cardiac silhouette. The thoracic aorta is ectatic. There are scattered bilateral areas of airspace disease, with relative sparing of the left upper lung zone. No effusion or pneumothorax. Postsurgical changes cervical spine. No acute bony abnormalities. IMPRESSION: 1. Bilateral patchy airspace disease consistent with multifocal pneumonia. Pattern is consistent with COVID-19. Electronically Signed   By: Randa Ngo M.D.   On: 01/02/2020 00:30        Scheduled Meds: . vitamin C  500 mg Oral Daily  . aspirin EC  81 mg Oral Daily  . cholecalciferol  1,000 Units Oral Daily  . enoxaparin (LOVENOX) injection  60 mg Subcutaneous Q24H  . gabapentin  600 mg Oral Daily  . insulin aspart  0-15 Units Subcutaneous TID WC  . insulin aspart  0-5 Units Subcutaneous QHS  . labetalol  200 mg Oral BID  . loratadine  10 mg Oral Daily  . methylPREDNISolone (SOLU-MEDROL) injection  0.5 mg/kg Intravenous Q12H  . mometasone-formoterol  2 puff Inhalation BID  . pantoprazole  40 mg Oral Daily  . pravastatin  20 mg Oral QHS  . zinc sulfate  220 mg Oral Daily   Continuous Infusions: . 0.9 % NaCl with KCl 20 mEq / L 75 mL/hr at 01/03/20 1228  . remdesivir 100 mg in NS 100 mL 100 mg (01/03/20 1149)     LOS: 1 day    Time spent: 42 minutes spent on chart review, discussion with nursing staff, consultants, updating family and interview/physical  exam; more than 50% of that time was spent in counseling and/or coordination of  care.    Tahiry Spicer J [place] DO [facility] Available via Epic secure chat 7am-7pm After these hours, please refer to coverage provider listed on [email or web address] 01/03/2020, 12:32 PM

## 2020-01-03 NOTE — Progress Notes (Signed)
Was in patient room for morning assessment and medication administration. Patient was initially on CPAP at 4L and her O2 sats was 91-92%. Patient switched from CPAP to [place] 2L and she was 87-88%. Dr. [place] came in patient room and I addressed concern with him. He stated that patient is ok to sat in the 88% range and he gave the ok to bump patient 02 up to 4L [place]. He also wants patient to use incentive spirometer and flutter valve if one is available. He is also ordering continuous pulse ox for patient.

## 2020-01-03 NOTE — Progress Notes (Signed)
Patient called b/c she was experienced abdominal pain. This LPN went to patient room and discovered that patient was more lethargic, she was clammy, respirations were a little more labored, and she had 1 episode of N/V of yellow bile. Notified charge nurse Mollie Germany, notified Dr. [place] by [facility] page and notified charge nurse. See new orders per Dr. [place] Rapid response nurse Ubaldo Glassing came and assessed patient she stated that if patient gets up to go to the bathroom she needs to use the [facility] and keep the O2 monitor hooked up to patient. Patient was able to follow commands for the rapid response nurse. Patient has received PRN medications for Nausea and Vomiting and I will continue to monitor patient.   VS  T 98 oral BP 101/70 MAP 72 RR 30 O2 93% [place] 4 L

## 2020-01-04 LAB — COMPREHENSIVE METABOLIC PANEL
ALT: 59 U/L — ABNORMAL HIGH (ref 0–44)
AST: 80 U/L — ABNORMAL HIGH (ref 15–41)
Albumin: 3 g/dL — ABNORMAL LOW (ref 3.5–5.0)
Alkaline Phosphatase: 65 U/L (ref 38–126)
Anion gap: 9 (ref 5–15)
BUN: 23 mg/dL — ABNORMAL HIGH (ref 6–20)
CO2: 26 mmol/L (ref 22–32)
Calcium: 8.4 mg/dL — ABNORMAL LOW (ref 8.9–10.3)
Chloride: 103 mmol/L (ref 98–111)
Creatinine, Ser: 0.75 mg/dL (ref 0.44–1.00)
GFR calc Af Amer: >60 mL/min (ref >60)
GFR calc non Af Amer: >60 mL/min (ref >60)
Glucose, Bld: 168 mg/dL — ABNORMAL HIGH (ref 70–99)
Potassium: 4.3 mmol/L (ref 3.5–5.1)
Sodium: 138 mmol/L (ref 135–145)
Total Bilirubin: 0.4 mg/dL (ref 0.3–1.2)
Total Protein: 7 g/dL (ref 6.5–8.1)

## 2020-01-04 LAB — CBC WITH DIFFERENTIAL/PLATELET
Abs Immature Granulocytes: 0.05 10*3/uL (ref 0.00–0.07)
Basophils Absolute: 0 10*3/uL (ref 0.0–0.1)
Basophils Relative: 0 %
Eosinophils Absolute: 0 10*3/uL (ref 0.0–0.5)
Eosinophils Relative: 0 %
HCT: 40.1 % (ref 36.0–46.0)
Hemoglobin: 12.7 g/dL (ref 12.0–15.0)
Immature Granulocytes: 1 %
Lymphocytes Relative: 12 %
Lymphs Abs: 0.8 10*3/uL (ref 0.7–4.0)
MCH: 28.6 pg (ref 26.0–34.0)
MCHC: 31.7 g/dL (ref 30.0–36.0)
MCV: 90.3 fL (ref 80.0–100.0)
Monocytes Absolute: 0.2 10*3/uL (ref 0.1–1.0)
Monocytes Relative: 3 %
Neutro Abs: 5.4 10*3/uL (ref 1.7–7.7)
Neutrophils Relative %: 84 %
Platelets: 210 10*3/uL (ref 150–400)
RBC: 4.44 MIL/uL (ref 3.87–5.11)
RDW: 13.9 % (ref 11.5–15.5)
WBC: 6.4 10*3/uL (ref 4.0–10.5)
nRBC: 0 % (ref 0.0–0.2)

## 2020-01-04 LAB — GLUCOSE, CAPILLARY
Glucose-Capillary: 161 mg/dL — ABNORMAL HIGH (ref 70–99)
Glucose-Capillary: 185 mg/dL — ABNORMAL HIGH (ref 70–99)
Glucose-Capillary: 158 mg/dL — ABNORMAL HIGH (ref 70–99)
Glucose-Capillary: 190 mg/dL — ABNORMAL HIGH (ref 70–99)

## 2020-01-04 LAB — MAGNESIUM: Magnesium: 2.2 mg/dL (ref 1.7–2.4)

## 2020-01-04 LAB — D-DIMER, QUANTITATIVE: D-Dimer, Quant: 0.47 ug/mL-FEU (ref 0.00–0.50)

## 2020-01-04 LAB — C-REACTIVE PROTEIN: CRP: 7.4 mg/dL — ABNORMAL HIGH (ref <1.0)

## 2020-01-04 NOTE — Evaluation (Signed)
Physical Therapy Evaluation Patient Details Name: [name] MRN: [id number] DOB: 08-02-1965 Today's Date: 01/04/2020   History of Present Illness  [name] is a 54 year old female with past medical history notable for long QT syndrome, HTN, OSA on CPAP, history of PE, PVD, GERD, depression who presented with shortness of breath and cough.  Also associated fevers, chills, fatigue, body aches, poor oral intake.  Recently obtained her first dose of Pfizer vaccine 8 days prior.  Patient with recent exposure to Covid positive person.  Covid testing 3 days ago was positive.  Clinical Impression  Pt admitted with above diagnosis.  Pt currently with functional limitations due to the deficits listed below (see PT Problem List). Pt will benefit from skilled PT to increase their independence and safety with mobility to allow discharge to the venue listed below.  Pt de-sat to 86% on 6 L/min, but by end of session she was back on 4 L/min and 90% in recliner.  Pt would benefit from RW at time of d/c.  At this time, do not feel she will need HH.     Follow Up Recommendations No PT follow up    Equipment Recommendations  Rolling walker with 5" wheels    Recommendations for Other Services       Precautions / Restrictions Precautions Precautions: None      Mobility  Bed Mobility Overal bed mobility: Modified Independent Bed Mobility: Supine to Sit     Supine to sit: Modified independent (Device/Increase time)     General bed mobility comments: Increased time to get to EOB, but not A. o2 dropped to 88% on 4 L/min  Transfers Overall transfer level: Needs assistance   Transfers: Sit to/from Stand Sit to Stand: Min assist;Min guard         General transfer comment: min/guard from bed and MIN A from low toilet  Ambulation/Gait Ambulation/Gait assistance: Min guard Gait Distance (Feet): 12 Feet (x2) Assistive device: Rolling walker (2 wheeled) Gait Pattern/deviations:  Decreased step length - right;Decreased step length - left     General Gait Details: Amb on 6 L/min with o2 dropping to 86%.  Once sitting increased to 90%.  Stairs            Wheelchair Mobility    Modified Rankin (Stroke Patients Only)       Balance Overall balance assessment: Needs assistance Sitting-balance support: Feet supported Sitting balance-Leahy Scale: Good     Standing balance support: During functional activity Standing balance-Leahy Scale: Fair                               Pertinent Vitals/Pain Pain Assessment: No/denies pain    Home Living Family/patient expects to be discharged to:: Private residence Living Arrangements: Alone   Type of Home: Apartment Home Access: Stairs to enter Entrance Stairs-Rails: Can reach both Entrance Stairs-Number of Steps: 2 Home Layout: One level Home Equipment: Cane - single point Additional Comments: Uses cane at times    Prior Function Level of Independence: Independent               Hand Dominance        Extremity/Trunk Assessment   Upper Extremity Assessment Upper Extremity Assessment: Overall WFL for tasks assessed    Lower Extremity Assessment Lower Extremity Assessment: Generalized weakness;Overall [facility] for tasks assessed       Communication   Communication: No difficulties  Cognition Arousal/Alertness: Awake/alert Behavior During  Therapy: WFL for tasks assessed/performed                                          General Comments      Exercises     Assessment/Plan    PT Assessment Patient needs continued PT services  PT Problem List Decreased strength;Decreased activity tolerance;Decreased balance;Decreased mobility;Decreased knowledge of use of DME       PT Treatment Interventions DME instruction;Gait training;Functional mobility training;Therapeutic activities;Therapeutic exercise;Balance training    PT Goals (Current goals can be found in the  Care Plan section)  Acute Rehab PT Goals Patient Stated Goal: feel better PT Goal Formulation: With patient Time For Goal Achievement: 01/18/20 Potential to Achieve Goals: Good    Frequency Min 3X/week   Barriers to discharge        Co-evaluation               AM-PAC PT "6 Clicks" Mobility  Outcome Measure Help needed turning from your back to your side while in a flat bed without using bedrails?: None Help needed moving from lying on your back to sitting on the side of a flat bed without using bedrails?: None Help needed moving to and from a bed to a chair (including a wheelchair)?: A Little Help needed standing up from a chair using your arms (e.g., wheelchair or bedside chair)?: A Little Help needed to walk in hospital room?: A Little Help needed climbing 3-5 steps with a railing? : A Lot 6 Click Score: 19    End of Session Equipment Utilized During Treatment: Oxygen Activity Tolerance: Patient limited by fatigue Patient left: in chair;with call bell/phone within reach Nurse Communication: Mobility status PT Visit Diagnosis: Difficulty in walking, not elsewhere classified (R26.2);Muscle weakness (generalized) (M62.81)    Time: 8502-7741 PT Time Calculation (min) (ACUTE ONLY): 46 min   Charges:   PT Evaluation $PT Eval Moderate Complexity: 1 Mod PT Treatments $Gait Training: 8-22 mins $Therapeutic Activity: 8-22 mins        [name], [name] Pager [phone] 01/04/2020   [name] 01/04/2020, 1:55 PM

## 2020-01-04 NOTE — Progress Notes (Signed)
Patient noted to be in tachypnea in the 30's.  patient is not in distress, spo2 in 90's VSS and is able to carry a full conversation without SOB.  Patient stated that she is actually breathing better than she was before. Patient has CPAP on at 4 L Will continuee to monitor

## 2020-01-04 NOTE — Progress Notes (Signed)
PROGRESS NOTE    [name]  VVO:[id number] DOB: Apr 14, 1966 DOA: 01/01/2020 PCP: [name], NP    Brief Narrative:  [name] is a 54 year old female with past medical history notable for long QT syndrome, HTN, OSA on CPAP, history of PE, PVD, GERD, depression who presented with shortness of breath and cough.  Also associated fevers, chills, fatigue, body aches, poor oral intake.  Recently obtained her first dose of Pfizer vaccine 8 days prior.  Patient with recent exposure to Covid positive person.  Covid testing 3 days ago was positive.  In the ED, febrile with temperature 100.4 F.  Oxygen saturation as low as 92% on room air.  Tachypneic with respiratory rate in the 20s to 30s. SARS-CoV-2 PCR test positive.  WBC 3.6, hemoglobin 13.8, hematocrit 43.2, platelet 179.  Sodium 138, potassium 3.2, chloride 97, bicarb 25, BUN 14, creatinine 1.2, glucose 114. Chest x-ray showing bilateral patchy airspace disease consistent with multifocal pneumonia. Patient was given Tylenol, Decadron, remdesivir, Tussionex, and 40 mEq p.o. potassium.  Assessment & Plan:   Principal Problem:   OSA on CPAP Active Problems:   Long Q-T syndrome   Pneumonia due to COVID-19 virus   Sepsis (HCC)   Hypokalemia   Sepsis, POA Acute hypoxic respiratory failure secondary to acute Covid-19 viral pneumonia during the ongoing 2020/2021 Covid 19 Pandemic - POA Patient presenting with progressive shortness of breath, fevers, chills and myalgias.  Patient was noted to have a temperature of 100.4 F tachypnea with hypoxia requiring supplemental oxygen.  Recently received first dose Pfizer vaccine 1 week prior with known Covid exposure thereafter.  Procalcitonin less than 0.10.  Chest x-ray with multifocal pneumonia. --COVID test: PCR + 01/01/2020 --CRP 16.0>10.6>7.4 --ddimer 0.51>0.41>0.47 --Remdesivir, plan 5-day course (Day #3/5) --Solumedrol 60 mg IV every 12 hours --prone for 2-3hrs every 12hrs if  able --Continue supplemental oxygen, titrate to maintain SPO2 greater than 92%, currently on 4 L nasal cannula with SPO2 89% --Continue supportive care with albuterol MDI prn, vitamin C, zinc, Tylenol, antitussives (benzonatate/ Mucinex/Tussionex) --Incentive spirometry/flutter valve --Follow CBC, CMP, D-dimer, ferritin, and CRP daily --Continue airborne/contact isolation precautions for 3 weeks from the day of diagnosis  The treatment plan and use of medications and known side effects were discussed with patient/family. Some of the medications used are based on case reports/anecdotal data.  All other medications being used in the management of COVID-19 based on limited study data.  Complete risks and long-term side effects are unknown, however in the best clinical judgment they seem to be of some benefit.  Patient wanted to proceed with treatment options provided.  Hypokalemia:  Etiology likely secondary to home diuretic use versus poor oral intake.  Was repleted on admission. --Follow electrolytes daily to include magnesium  Type 2 diabetes mellitus Hemoglobin A1c 6.3, well controlled.  On Metformin 500 mg p.o. daily, Ozempic 1mg  [place] q7d at home. --Hold oral diabetic regimen while inpatient --Insulin sliding scale for coverage --CBGs before every meal/at bedtime  History of long QT syndrome QTc 530. --Avoid QT prolonging agents --Continue to monitor on telemetry   Hypertension:  On lisinopril 2.5 mg p.o. daily and spironolactone 25 mg p.o. daily, and labetalol 200 mg p.o. twice daily, and triamterene-HCTZ 75-50 mg nightly at home. BP 111/61 this morning. --Hold home antihypertensives --Continue aspirin and statin  Hyperlipidemia: Continue pravastatin 20 mg p.o. daily  GERD: Continue PPI  OSA:  Continue CPAP at night  Chronic pain syndrome --Continue home oxycodone-acetaminophen 5-325 mg  q6h prn --Reduce home gabapentin from 600 to 300 mg p.o. daily  Weakness,  deconditioning, debility: --PT evaluation pending  DVT prophylaxis: Lovenox Code Status: Full code Family Communication: Updated patient extensively at bedside  Disposition Plan:  Status is: Inpatient  Remains inpatient appropriate because:Hemodynamically unstable, Altered mental status, Ongoing diagnostic testing needed not appropriate for outpatient work up, Unsafe d/c plan, IV treatments appropriate due to intensity of illness or inability to take PO and Inpatient level of care appropriate due to severity of illness   Dispo: The patient is from: Home              Anticipated d/c is to: Home              Anticipated d/c date is: 3 days              Patient currently is not medically stable to d/c.   Consultants:   None  Procedures:   None  Antimicrobials:   Remdesivir 9/2>>   Subjective: Patient seen and examined bedside, resting comfortably.  Continues with significant weakness, fatigue, cough but is nonproductive and generalized ill feeling.  Continues with baseline dyspnea worse with any type of exertion.  Also reports poor appetite.  No other complaints or concerns at this time.  Denies headache, no dizziness, no chest pain, no palpitations, no abdominal pain.  No acute events overnight nursing staff.  Objective: Vitals:   01/03/20 1950 01/03/20 2318 01/04/20 0521 01/04/20 0530  BP: 119/66 106/65 111/61   Pulse: 83 78 75   Resp: (!) 22 (!) 24 19   Temp: 98.2 F (36.8 C) 98 F (36.7 C) 98.9 F (37.2 C)   TempSrc: Oral Oral    SpO2: 95% 90% (!) 83% (!) 89%  Weight:      Height:        Intake/Output Summary (Last 24 hours) at 01/04/2020 1109 Last data filed at 01/04/2020 0700 Gross per 24 hour  Intake 1488.13 ml  Output --  Net 1488.13 ml   Filed Weights   01/01/20 1940  Weight: 124.3 kg    Examination:  General exam: Appears calm and comfortable, ill in appearance Respiratory system: Coarse breath sounds bilaterally, slightly decreased bases, normal  respiratory effort, on 4 L nasal cannula with SPO2 89% at rest and no accessory muscle use. Cardiovascular system: S1 & S2 heard, RRR. No JVD, murmurs, rubs, gallops or clicks. No pedal edema. Gastrointestinal system: Abdomen is nondistended, soft and nontender. No organomegaly or masses felt. Normal bowel sounds heard. Central nervous system: Alert and oriented. No focal neurological deficits. Extremities: Symmetric 5 x 5 power. Skin: No rashes, lesions or ulcers Psychiatry: Judgement and insight appear normal.  Depressed mood, flat affect.    Data Reviewed: I have personally reviewed following labs and imaging studies  CBC: Recent Labs  Lab 01/01/20 2358 01/03/20 0408 01/04/20 0343  WBC 3.6* 5.2 6.4  NEUTROABS 2.5 4.1 5.4  HGB 13.8 12.5 12.7  HCT 43.2 40.0 40.1  MCV 90.2 90.7 90.3  PLT 179 176 712   Basic Metabolic Panel: Recent Labs  Lab 01/01/20 2358 01/02/20 0635 01/03/20 0408 01/03/20 1731 01/04/20 0343  NA 138  --  137  --  138  K 3.2*  --  3.4*  --  4.3  CL 97*  --  99  --  103  CO2 25  --  26  --  26  GLUCOSE 114*  --  144*  --  168*  BUN 14  --  20  --  23*  CREATININE 1.25*  --  0.87  --  0.75  CALCIUM 8.6*  --  8.6*  --  8.4*  MG  --  2.0  --  2.2 2.2   GFR: Estimated Creatinine Clearance: 104.7 mL/min (by C-G formula based on SCr of 0.75 mg/dL). Liver Function Tests: Recent Labs  Lab 01/03/20 0408 01/04/20 0343  AST 78* 80*  ALT 45* 59*  ALKPHOS 66 65  BILITOT 0.4 0.4  PROT 7.1 7.0  ALBUMIN 3.2* 3.0*   No results for input(s): LIPASE, AMYLASE in the last 168 hours. No results for input(s): AMMONIA in the last 168 hours. Coagulation Profile: No results for input(s): INR, PROTIME in the last 168 hours. Cardiac Enzymes: No results for input(s): CKTOTAL, CKMB, CKMBINDEX, TROPONINI in the last 168 hours. BNP (last 3 results) No results for input(s): PROBNP in the last 8760 hours. HbA1C: Recent Labs    01/03/20 0408  HGBA1C 6.3*    CBG: Recent Labs  Lab 01/03/20 0817 01/03/20 1111 01/03/20 1626 01/03/20 2125 01/04/20 0729  GLUCAP 127* 139* 166* 210* 161*   Lipid Profile: No results for input(s): CHOL, HDL, LDLCALC, TRIG, CHOLHDL, LDLDIRECT in the last 72 hours. Thyroid Function Tests: No results for input(s): TSH, T4TOTAL, FREET4, T3FREE, THYROIDAB in the last 72 hours. Anemia Panel: Recent Labs    01/02/20 0616  FERRITIN 371*   Sepsis Labs: Recent Labs  Lab 01/02/20 0635  PROCALCITON <0.10    Recent Results (from the past 240 hour(s))  SARS Coronavirus 2 by RT PCR (hospital order, performed in [facility] hospital lab) Nasopharyngeal Nasopharyngeal Swab     Status: Abnormal   Collection Time: 01/01/20 11:58 PM   Specimen: Nasopharyngeal Swab  Result Value Ref Range Status   SARS Coronavirus 2 POSITIVE (A) NEGATIVE Final    Comment: RESULT CALLED TO, READ BACK BY AND VERIFIED WITH: RN B BROOKS AT 0209 01/02/20 CRUICKSHANK A (NOTE) SARS-CoV-2 target nucleic acids are DETECTED  SARS-CoV-2 RNA is generally detectable in upper respiratory specimens  during the acute phase of infection.  Positive results are indicative  of the presence of the identified virus, but do not rule out bacterial infection or co-infection with other pathogens not detected by the test.  Clinical correlation with patient history and  other diagnostic information is necessary to determine patient infection status.  The expected result is negative.  Fact Sheet for Patients:   StrictlyIdeas.no   Fact Sheet for Healthcare Providers:   BankingDealers.co.za    This test is not yet approved or cleared by the [place] FDA and  has been authorized for detection and/or diagnosis of SARS-CoV-2 by FDA under an Emergency Use Authorization (EUA).  This EUA will remain in effect (meaning t his test can be used) for the duration of  the COVID-19 declaration under Section 564(b)(1)  of the Act, 21 U.S.C. section 360-bbb-3(b)(1), unless the authorization is terminated or revoked sooner.  Performed at [facility], [place] [address], [place], [place] [address]   Culture, blood (Routine X 2) w Reflex to ID Panel     Status: None (Preliminary result)   Collection Time: 01/02/20  6:35 AM   Specimen: BLOOD  Result Value Ref Range Status   Specimen Description   Final    BLOOD LEFT ANTECUBITAL Performed at [facility] [address], [place], [place] [address]    Special Requests   Final    BOTTLES DRAWN AEROBIC AND ANAEROBIC  Blood Culture results may not be optimal due to an inadequate volume of blood received in culture bottles Performed at [facility] [address], [place], [place] [address]    Culture   Final    NO GROWTH 1 DAY Performed at [facility], [place] [address] [place], [place] [address]    Report Status PENDING  Incomplete  Culture, blood (Routine X 2) w Reflex to ID Panel     Status: None (Preliminary result)   Collection Time: 01/02/20  7:07 PM   Specimen: BLOOD  Result Value Ref Range Status   Specimen Description   Final    BLOOD RIGHT ANTECUBITAL Performed at [facility] [address] [place], [place] [address]    Special Requests   Final    BOTTLES DRAWN AEROBIC AND ANAEROBIC Blood Culture adequate volume Performed at [facility] [address] [place], [place] [address]    Culture   Final    NO GROWTH < 12 HOURS Performed at [facility] [address], [place], [place] [address]    Report Status PENDING  Incomplete         Radiology Studies: DG CHEST PORT 1 VIEW  Result Date: 01/03/2020 CLINICAL DATA:  Shortness of breath, COVID-19. EXAM: PORTABLE CHEST 1 VIEW COMPARISON:  January 02, 2020. FINDINGS: The heart size and mediastinal contours are within normal limits. No pneumothorax or pleural effusion is noted.  Bilateral lung opacities are noted, right greater than left, consistent with multifocal pneumonia. The visualized skeletal structures are unremarkable. IMPRESSION: Bilateral lung opacities are noted, right greater than left, consistent with multifocal pneumonia. Electronically Signed   By: Marijo Conception M.D.   On: 01/03/2020 11:56        Scheduled Meds: . vitamin C  500 mg Oral Daily  . aspirin EC  81 mg Oral Daily  . cholecalciferol  1,000 Units Oral Daily  . enoxaparin (LOVENOX) injection  60 mg Subcutaneous Q24H  . gabapentin  300 mg Oral Daily  . insulin aspart  0-15 Units Subcutaneous TID WC  . insulin aspart  0-5 Units Subcutaneous QHS  . loratadine  10 mg Oral Daily  . methylPREDNISolone (SOLU-MEDROL) injection  0.5 mg/kg Intravenous Q12H  . mometasone-formoterol  2 puff Inhalation BID  . pantoprazole  40 mg Oral Daily  . pravastatin  20 mg Oral QHS  . zinc sulfate  220 mg Oral Daily   Continuous Infusions: . 0.9 % NaCl with KCl 20 mEq / L 75 mL/hr at 01/04/20 0137  . remdesivir 100 mg in NS 100 mL 100 mg (01/04/20 0851)     LOS: 2 days    Time spent: 38 minutes spent on chart review, discussion with nursing staff, consultants, updating family and interview/physical exam; more than 50% of that time was spent in counseling and/or coordination of care.    Thang Flett J [place] DO [facility] Available via Epic secure chat 7am-7pm After these hours, please refer to coverage provider listed on [email or web address] 01/04/2020, 11:09 AM

## 2020-01-05 LAB — CBC WITH DIFFERENTIAL/PLATELET
Abs Immature Granulocytes: 0.11 10*3/uL — ABNORMAL HIGH (ref 0.00–0.07)
Basophils Absolute: 0 10*3/uL (ref 0.0–0.1)
Basophils Relative: 0 %
Eosinophils Absolute: 0 10*3/uL (ref 0.0–0.5)
Eosinophils Relative: 0 %
HCT: 41.2 % (ref 36.0–46.0)
Hemoglobin: 13.1 g/dL (ref 12.0–15.0)
Immature Granulocytes: 1 %
Lymphocytes Relative: 9 %
Lymphs Abs: 0.8 10*3/uL (ref 0.7–4.0)
MCH: 28.6 pg (ref 26.0–34.0)
MCHC: 31.8 g/dL (ref 30.0–36.0)
MCV: 90 fL (ref 80.0–100.0)
Monocytes Absolute: 0.4 10*3/uL (ref 0.1–1.0)
Monocytes Relative: 4 %
Neutro Abs: 8.1 10*3/uL — ABNORMAL HIGH (ref 1.7–7.7)
Neutrophils Relative %: 86 %
Platelets: 273 10*3/uL (ref 150–400)
RBC: 4.58 MIL/uL (ref 3.87–5.11)
RDW: 13.8 % (ref 11.5–15.5)
WBC: 9.4 10*3/uL (ref 4.0–10.5)
nRBC: 0 % (ref 0.0–0.2)

## 2020-01-05 LAB — COMPREHENSIVE METABOLIC PANEL
ALT: 57 U/L — ABNORMAL HIGH (ref 0–44)
AST: 50 U/L — ABNORMAL HIGH (ref 15–41)
Albumin: 2.8 g/dL — ABNORMAL LOW (ref 3.5–5.0)
Alkaline Phosphatase: 60 U/L (ref 38–126)
Anion gap: 9 (ref 5–15)
BUN: 18 mg/dL (ref 6–20)
CO2: 26 mmol/L (ref 22–32)
Calcium: 8.7 mg/dL — ABNORMAL LOW (ref 8.9–10.3)
Chloride: 105 mmol/L (ref 98–111)
Creatinine, Ser: 0.74 mg/dL (ref 0.44–1.00)
GFR calc Af Amer: >60 mL/min (ref >60)
GFR calc non Af Amer: >60 mL/min (ref >60)
Glucose, Bld: 174 mg/dL — ABNORMAL HIGH (ref 70–99)
Potassium: 4 mmol/L (ref 3.5–5.1)
Sodium: 140 mmol/L (ref 135–145)
Total Bilirubin: 0.4 mg/dL (ref 0.3–1.2)
Total Protein: 6.5 g/dL (ref 6.5–8.1)

## 2020-01-05 LAB — GLUCOSE, CAPILLARY
Glucose-Capillary: 171 mg/dL — ABNORMAL HIGH (ref 70–99)
Glucose-Capillary: 157 mg/dL — ABNORMAL HIGH (ref 70–99)
Glucose-Capillary: 224 mg/dL — ABNORMAL HIGH (ref 70–99)
Glucose-Capillary: 193 mg/dL — ABNORMAL HIGH (ref 70–99)

## 2020-01-05 LAB — C-REACTIVE PROTEIN: CRP: 2.5 mg/dL — ABNORMAL HIGH (ref <1.0)

## 2020-01-05 LAB — D-DIMER, QUANTITATIVE: D-Dimer, Quant: 0.4 ug/mL-FEU (ref 0.00–0.50)

## 2020-01-05 NOTE — Progress Notes (Signed)
PROGRESS NOTE    [name]  QMG:[id number] DOB: 02-Aug-1965 DOA: 01/01/2020 PCP: [name], NP    Brief Narrative:  [name] is a 54 year old female with past medical history notable for long QT syndrome, HTN, OSA on CPAP, history of PE, PVD, GERD, depression who presented with shortness of breath and cough.  Also associated fevers, chills, fatigue, body aches, poor oral intake.  Recently obtained her first dose of Pfizer vaccine 8 days prior.  Patient with recent exposure to Covid positive person.  Covid testing 3 days ago was positive.  In the ED, febrile with temperature 100.4 F.  Oxygen saturation as low as 92% on room air.  Tachypneic with respiratory rate in the 20s to 30s. SARS-CoV-2 PCR test positive.  WBC 3.6, hemoglobin 13.8, hematocrit 43.2, platelet 179.  Sodium 138, potassium 3.2, chloride 97, bicarb 25, BUN 14, creatinine 1.2, glucose 114. Chest x-ray showing bilateral patchy airspace disease consistent with multifocal pneumonia. Patient was given Tylenol, Decadron, remdesivir, Tussionex, and 40 mEq p.o. potassium.  Assessment & Plan:   Principal Problem:   OSA on CPAP Active Problems:   Long Q-T syndrome   Pneumonia due to COVID-19 virus   Sepsis (HCC)   Hypokalemia   Sepsis, POA Acute hypoxic respiratory failure secondary to acute Covid-19 viral pneumonia during the ongoing 2020/2021 Covid 19 Pandemic - POA Patient presenting with progressive shortness of breath, fevers, chills and myalgias.  Patient was noted to have a temperature of 100.4 F tachypnea with hypoxia requiring supplemental oxygen.  Recently received first dose Pfizer vaccine 1 week prior with known Covid exposure thereafter.  Procalcitonin less than 0.10.  Chest x-ray with multifocal pneumonia. --COVID test: PCR + 01/01/2020 --CRP 16.0>10.6>7.4>2.5 --ddimer 0.51>0.41>0.47>0.40 --Remdesivir, plan 5-day course (Day #4/5) --Solumedrol 60 mg IV every 12 hours --prone for 2-3hrs every 12hrs  if able --Continue supplemental oxygen, titrate to maintain SPO2 greater than 92%, currently on 4 L nasal cannula with SPO2 89% --Continue supportive care with albuterol MDI prn, vitamin C, zinc, Tylenol, antitussives (benzonatate/ Mucinex/Tussionex) --Incentive spirometry/flutter valve --Follow CBC, CMP, D-dimer, ferritin, and CRP daily --Continue airborne/contact isolation precautions for 3 weeks from the day of diagnosis  The treatment plan and use of medications and known side effects were discussed with patient/family. Some of the medications used are based on case reports/anecdotal data.  All other medications being used in the management of COVID-19 based on limited study data.  Complete risks and long-term side effects are unknown, however in the best clinical judgment they seem to be of some benefit.  Patient wanted to proceed with treatment options provided.  Hypokalemia:  Etiology likely secondary to home diuretic use versus poor oral intake.  Was repleted on admission. --Follow electrolytes daily to include magnesium  Type 2 diabetes mellitus Hemoglobin A1c 6.3, well controlled.  On Metformin 500 mg p.o. daily, Ozempic 1mg  [place] q7d at home. --Hold oral diabetic regimen while inpatient --Insulin sliding scale for coverage --CBGs before every meal/at bedtime  History of long QT syndrome QTc 530. --Avoid QT prolonging agents --Continue to monitor on telemetry   Hypertension:  On lisinopril 2.5 mg p.o. daily and spironolactone 25 mg p.o. daily, and labetalol 200 mg p.o. twice daily, and triamterene-HCTZ 75-50 mg nightly at home. BP 126/70 this morning. --Continue to hold home antihypertensives --Continue aspirin and statin  Hyperlipidemia: Continue pravastatin 20 mg p.o. daily  GERD: Continue PPI  OSA:  Continue CPAP at night  Chronic pain syndrome --Continue home oxycodone-acetaminophen  5-325 mg q6h prn --Reduce home gabapentin from 600 to 300 mg p.o.  daily  Weakness, deconditioning, debility: --PT eval w/ no needs; recommend walker on discharge  DVT prophylaxis: Lovenox Code Status: Full code Family Communication: Updated patient extensively at bedside  Disposition Plan:  Status is: Inpatient  Remains inpatient appropriate because:Hemodynamically unstable, Altered mental status, Ongoing diagnostic testing needed not appropriate for outpatient work up, Unsafe d/c plan, IV treatments appropriate due to intensity of illness or inability to take PO and Inpatient level of care appropriate due to severity of illness   Dispo: The patient is from: Home              Anticipated d/c is to: Home              Anticipated d/c date is: 2 days              Patient currently is not medically stable to d/c.   Consultants:   None  Procedures:   None  Antimicrobials:   Remdesivir 9/2>>   Subjective: Patient seen and examined bedside, resting comfortably.  Sleeping but arousable.  Continues with significant weakness, fatigue, poor appetite, and generalized ill feeling.  Continues with baseline dyspnea worse with any type of exertion.  Continues on 4 L nasal cannula.  No other complaints or concerns at this time.  Denies headache, no dizziness, no chest pain, no palpitations, no abdominal pain.  No acute events overnight nursing staff.  Objective: Vitals:   01/04/20 0530 01/04/20 1500 01/04/20 2022 01/05/20 0519  BP:  129/80 131/82 126/70  Pulse:  60 78 60  Resp:  19 (!) 22 19  Temp:  97.7 F (36.5 C) 97.7 F (36.5 C) 98 F (36.7 C)  TempSrc:  Oral    SpO2: (!) 89% (!) 86% (!) 86% 91%  Weight:      Height:        Intake/Output Summary (Last 24 hours) at 01/05/2020 1052 Last data filed at 01/05/2020 0900 Gross per 24 hour  Intake 383 ml  Output 300 ml  Net 83 ml   Filed Weights   01/01/20 1940  Weight: 124.3 kg    Examination:  General exam: Appears calm and comfortable, ill in appearance Respiratory system: Coarse  breath sounds bilaterally, slightly decreased bases, normal respiratory effort, on 4 L nasal cannula with SPO2 91% at rest and no accessory muscle use. Cardiovascular system: S1 & S2 heard, RRR. No JVD, murmurs, rubs, gallops or clicks. No pedal edema. Gastrointestinal system: Abdomen is nondistended, soft and nontender. No organomegaly or masses felt. Normal bowel sounds heard. Central nervous system: Alert and oriented. No focal neurological deficits. Extremities: Symmetric 5 x 5 power. Skin: No rashes, lesions or ulcers Psychiatry: Judgement and insight appear normal.  Depressed mood, flat affect.    Data Reviewed: I have personally reviewed following labs and imaging studies  CBC: Recent Labs  Lab 01/01/20 2358 01/03/20 0408 01/04/20 0343 01/05/20 0440  WBC 3.6* 5.2 6.4 9.4  NEUTROABS 2.5 4.1 5.4 8.1*  HGB 13.8 12.5 12.7 13.1  HCT 43.2 40.0 40.1 41.2  MCV 90.2 90.7 90.3 90.0  PLT 179 176 210 267   Basic Metabolic Panel: Recent Labs  Lab 01/01/20 2358 01/02/20 0635 01/03/20 0408 01/03/20 1731 01/04/20 0343 01/05/20 0440  NA 138  --  137  --  138 140  K 3.2*  --  3.4*  --  4.3 4.0  CL 97*  --  99  --  103  105  CO2 25  --  26  --  26 26  GLUCOSE 114*  --  144*  --  168* 174*  BUN 14  --  20  --  23* 18  CREATININE 1.25*  --  0.87  --  0.75 0.74  CALCIUM 8.6*  --  8.6*  --  8.4* 8.7*  MG  --  2.0  --  2.2 2.2  --    GFR: Estimated Creatinine Clearance: 104.7 mL/min (by C-G formula based on SCr of 0.74 mg/dL). Liver Function Tests: Recent Labs  Lab 01/03/20 0408 01/04/20 0343 01/05/20 0440  AST 78* 80* 50*  ALT 45* 59* 57*  ALKPHOS 66 65 60  BILITOT 0.4 0.4 0.4  PROT 7.1 7.0 6.5  ALBUMIN 3.2* 3.0* 2.8*   No results for input(s): LIPASE, AMYLASE in the last 168 hours. No results for input(s): AMMONIA in the last 168 hours. Coagulation Profile: No results for input(s): INR, PROTIME in the last 168 hours. Cardiac Enzymes: No results for input(s): CKTOTAL,  CKMB, CKMBINDEX, TROPONINI in the last 168 hours. BNP (last 3 results) No results for input(s): PROBNP in the last 8760 hours. HbA1C: Recent Labs    01/03/20 0408  HGBA1C 6.3*   CBG: Recent Labs  Lab 01/04/20 0729 01/04/20 1156 01/04/20 1620 01/04/20 2110 01/05/20 0739  GLUCAP 161* 185* 158* 190* 171*   Lipid Profile: No results for input(s): CHOL, HDL, LDLCALC, TRIG, CHOLHDL, LDLDIRECT in the last 72 hours. Thyroid Function Tests: No results for input(s): TSH, T4TOTAL, FREET4, T3FREE, THYROIDAB in the last 72 hours. Anemia Panel: No results for input(s): VITAMINB12, FOLATE, FERRITIN, TIBC, IRON, RETICCTPCT in the last 72 hours. Sepsis Labs: Recent Labs  Lab 01/02/20 0635  PROCALCITON <0.10    Recent Results (from the past 240 hour(s))  SARS Coronavirus 2 by RT PCR (hospital order, performed in [facility] hospital lab) Nasopharyngeal Nasopharyngeal Swab     Status: Abnormal   Collection Time: 01/01/20 11:58 PM   Specimen: Nasopharyngeal Swab  Result Value Ref Range Status   SARS Coronavirus 2 POSITIVE (A) NEGATIVE Final    Comment: RESULT CALLED TO, READ BACK BY AND VERIFIED WITH: RN B BROOKS AT 0209 01/02/20 CRUICKSHANK A (NOTE) SARS-CoV-2 target nucleic acids are DETECTED  SARS-CoV-2 RNA is generally detectable in upper respiratory specimens  during the acute phase of infection.  Positive results are indicative  of the presence of the identified virus, but do not rule out bacterial infection or co-infection with other pathogens not detected by the test.  Clinical correlation with patient history and  other diagnostic information is necessary to determine patient infection status.  The expected result is negative.  Fact Sheet for Patients:   StrictlyIdeas.no   Fact Sheet for Healthcare Providers:   BankingDealers.co.za    This test is not yet approved or cleared by the [place] FDA and  has been authorized  for detection and/or diagnosis of SARS-CoV-2 by FDA under an Emergency Use Authorization (EUA).  This EUA will remain in effect (meaning t his test can be used) for the duration of  the COVID-19 declaration under Section 564(b)(1) of the Act, 21 U.S.C. section 360-bbb-3(b)(1), unless the authorization is terminated or revoked sooner.  Performed at [facility], [place] [address] [place], [place] [address]   Culture, blood (Routine X 2) w Reflex to ID Panel     Status: None (Preliminary result)   Collection Time: 01/02/20  6:35 AM   Specimen: BLOOD  Result Value Ref Range Status   Specimen Description   Final    BLOOD LEFT ANTECUBITAL Performed at [facility] [address], [place], [place] [address]    Special Requests   Final    BOTTLES DRAWN AEROBIC AND ANAEROBIC Blood Culture results may not be optimal due to an inadequate volume of blood received in culture bottles Performed at [facility] [address], [place], [place] [address]    Culture   Final    NO GROWTH 3 DAYS Performed at [facility], [place] [address], [place], [place] [address]    Report Status PENDING  Incomplete  Culture, blood (Routine X 2) w Reflex to ID Panel     Status: None (Preliminary result)   Collection Time: 01/02/20  7:07 PM   Specimen: BLOOD  Result Value Ref Range Status   Specimen Description   Final    BLOOD RIGHT ANTECUBITAL Performed at [facility] [address] [place], [place] [address]    Special Requests   Final    BOTTLES DRAWN AEROBIC AND ANAEROBIC Blood Culture adequate volume Performed at [facility] [address], [place], [place] [address]    Culture   Final    NO GROWTH 3 DAYS Performed at [facility], [place] [address] [place],  [address]    Report Status PENDING  Incomplete         Radiology Studies: DG CHEST PORT 1 VIEW  Result Date:  01/03/2020 CLINICAL DATA:  Shortness of breath, COVID-19. EXAM: PORTABLE CHEST 1 VIEW COMPARISON:  January 02, 2020. FINDINGS: The heart size and mediastinal contours are within normal limits. No pneumothorax or pleural effusion is noted. Bilateral lung opacities are noted, right greater than left, consistent with multifocal pneumonia. The visualized skeletal structures are unremarkable. IMPRESSION: Bilateral lung opacities are noted, right greater than left, consistent with multifocal pneumonia. Electronically Signed   By: Marijo Conception M.D.   On: 01/03/2020 11:56        Scheduled Meds: . vitamin C  500 mg Oral Daily  . aspirin EC  81 mg Oral Daily  . cholecalciferol  1,000 Units Oral Daily  . enoxaparin (LOVENOX) injection  60 mg Subcutaneous Q24H  . gabapentin  300 mg Oral Daily  . insulin aspart  0-15 Units Subcutaneous TID WC  . insulin aspart  0-5 Units Subcutaneous QHS  . loratadine  10 mg Oral Daily  . methylPREDNISolone (SOLU-MEDROL) injection  0.5 mg/kg Intravenous Q12H  . mometasone-formoterol  2 puff Inhalation BID  . pantoprazole  40 mg Oral Daily  . pravastatin  20 mg Oral QHS  . zinc sulfate  220 mg Oral Daily   Continuous Infusions: . remdesivir 100 mg in NS 100 mL 100 mg (01/04/20 0851)     LOS: 3 days    Time spent: 36 minutes spent on chart review, discussion with nursing staff, consultants, updating family and interview/physical exam; more than 50% of that time was spent in counseling and/or coordination of care.    Qianna Clagett J [place] DO [facility] Available via Epic secure chat 7am-7pm After these hours, please refer to coverage provider listed on [email or web address] 01/05/2020, 10:52 AM

## 2020-01-06 LAB — GLUCOSE, CAPILLARY
Glucose-Capillary: 204 mg/dL — ABNORMAL HIGH (ref 70–99)
Glucose-Capillary: 142 mg/dL — ABNORMAL HIGH (ref 70–99)
Glucose-Capillary: 184 mg/dL — ABNORMAL HIGH (ref 70–99)
Glucose-Capillary: 238 mg/dL — ABNORMAL HIGH (ref 70–99)

## 2020-01-06 LAB — COMPREHENSIVE METABOLIC PANEL
ALT: 86 U/L — ABNORMAL HIGH (ref 0–44)
AST: 68 U/L — ABNORMAL HIGH (ref 15–41)
Albumin: 3.4 g/dL — ABNORMAL LOW (ref 3.5–5.0)
Alkaline Phosphatase: 68 U/L (ref 38–126)
Anion gap: 13 (ref 5–15)
BUN: 20 mg/dL (ref 6–20)
CO2: 24 mmol/L (ref 22–32)
Calcium: 9.3 mg/dL (ref 8.9–10.3)
Chloride: 105 mmol/L (ref 98–111)
Creatinine, Ser: 0.85 mg/dL (ref 0.44–1.00)
GFR calc Af Amer: >60 mL/min (ref >60)
GFR calc non Af Amer: >60 mL/min (ref >60)
Glucose, Bld: 206 mg/dL — ABNORMAL HIGH (ref 70–99)
Potassium: 4.3 mmol/L (ref 3.5–5.1)
Sodium: 142 mmol/L (ref 135–145)
Total Bilirubin: 0.5 mg/dL (ref 0.3–1.2)
Total Protein: 7 g/dL (ref 6.5–8.1)

## 2020-01-06 LAB — CBC WITH DIFFERENTIAL/PLATELET
Abs Immature Granulocytes: 0.21 10*3/uL — ABNORMAL HIGH (ref 0.00–0.07)
Basophils Absolute: 0 10*3/uL (ref 0.0–0.1)
Basophils Relative: 0 %
Eosinophils Absolute: 0 10*3/uL (ref 0.0–0.5)
Eosinophils Relative: 0 %
HCT: 44.6 % (ref 36.0–46.0)
Hemoglobin: 14.3 g/dL (ref 12.0–15.0)
Immature Granulocytes: 2 %
Lymphocytes Relative: 11 %
Lymphs Abs: 1.2 10*3/uL (ref 0.7–4.0)
MCH: 28.6 pg (ref 26.0–34.0)
MCHC: 32.1 g/dL (ref 30.0–36.0)
MCV: 89.2 fL (ref 80.0–100.0)
Monocytes Absolute: 0.4 10*3/uL (ref 0.1–1.0)
Monocytes Relative: 3 %
Neutro Abs: 9.3 10*3/uL — ABNORMAL HIGH (ref 1.7–7.7)
Neutrophils Relative %: 84 %
Platelets: 361 10*3/uL (ref 150–400)
RBC: 5 MIL/uL (ref 3.87–5.11)
RDW: 13.8 % (ref 11.5–15.5)
WBC: 11.1 10*3/uL — ABNORMAL HIGH (ref 4.0–10.5)
nRBC: 0 % (ref 0.0–0.2)

## 2020-01-06 LAB — D-DIMER, QUANTITATIVE: D-Dimer, Quant: 0.49 ug/mL-FEU (ref 0.00–0.50)

## 2020-01-06 LAB — C-REACTIVE PROTEIN: CRP: 1.3 mg/dL — ABNORMAL HIGH (ref <1.0)

## 2020-01-06 MED ORDER — METHYLPREDNISOLONE SODIUM SUCC 40 MG IJ SOLR
40.0000 mg | Freq: Two times a day (BID) | INTRAMUSCULAR | Status: DC
  Administered 2020-01-06 – 2020-01-07 (×2): 40 mg via INTRAVENOUS
  Filled 2020-01-06 (×2): qty 1

## 2020-01-06 NOTE — Progress Notes (Signed)
PROGRESS NOTE    [name]  WNI:[id number] DOB: 1966-04-27 DOA: 01/01/2020 PCP: [name], NP    Brief Narrative:  CHALEY CASTELLANOS is a 54 year old female with past medical history notable for long QT syndrome, HTN, OSA on CPAP, history of PE, PVD, GERD, depression who presented with shortness of breath and cough.  Also associated fevers, chills, fatigue, body aches, poor oral intake.  Recently obtained her first dose of Pfizer vaccine 8 days prior.  Patient with recent exposure to Covid positive person.  Covid testing 3 days ago was positive.  In the ED, febrile with temperature 100.4 F.  Oxygen saturation as low as 92% on room air.  Tachypneic with respiratory rate in the 20s to 30s. SARS-CoV-2 PCR test positive.  WBC 3.6, hemoglobin 13.8, hematocrit 43.2, platelet 179.  Sodium 138, potassium 3.2, chloride 97, bicarb 25, BUN 14, creatinine 1.2, glucose 114. Chest x-ray showing bilateral patchy airspace disease consistent with multifocal pneumonia. Patient was given Tylenol, Decadron, remdesivir, Tussionex, and 40 mEq p.o. potassium.  Assessment & Plan:   Principal Problem:   OSA on CPAP Active Problems:   Long Q-T syndrome   Pneumonia due to COVID-19 virus   Sepsis (HCC)   Hypokalemia   Sepsis, POA Acute hypoxic respiratory failure secondary to acute Covid-19 viral pneumonia during the ongoing 2020/2021 Covid 19 Pandemic - POA Patient presenting with progressive shortness of breath, fevers, chills and myalgias.  Patient was noted to have a temperature of 100.4 F tachypnea with hypoxia requiring supplemental oxygen.  Recently received first dose Pfizer vaccine 1 week prior with known Covid exposure thereafter.  Procalcitonin less than 0.10.  Chest x-ray with multifocal pneumonia. --COVID test: PCR + 01/01/2020 --CRP 16.0>10.6>7.4>2.5>1.3 --ddimer 0.51>0.41>0.47>0.40>0.49 --Remdesivir, plan 5-day course (Day #5/5) --Decrease Solumedrol to 40 mg IV every 12 hours --prone  for 2-3hrs every 12hrs if able --Continue supplemental oxygen, titrate to maintain SPO2 greater than 92%, currently on room air with SPO2 95% at rest --O2 desaturation screen today --Continue supportive care with albuterol MDI prn, vitamin C, zinc, Tylenol, antitussives (benzonatate/ Mucinex/Tussionex) --Incentive spirometry/flutter valve --Follow CBC, CMP, D-dimer, ferritin, and CRP daily --Continue airborne/contact isolation precautions for 3 weeks from the day of diagnosis  The treatment plan and use of medications and known side effects were discussed with patient/family. Some of the medications used are based on case reports/anecdotal data.  All other medications being used in the management of COVID-19 based on limited study data.  Complete risks and long-term side effects are unknown, however in the best clinical judgment they seem to be of some benefit.  Patient wanted to proceed with treatment options provided.  Hypokalemia:  Etiology likely secondary to home diuretic use versus poor oral intake.  Was repleted on admission. --Follow electrolytes daily to include magnesium  Type 2 diabetes mellitus Hemoglobin A1c 6.3, well controlled.  On Metformin 500 mg p.o. daily, Ozempic 1mg  [place] q7d at home. --Hold oral diabetic regimen while inpatient --Insulin sliding scale for coverage --CBGs before every meal/at bedtime  History of long QT syndrome QTc 530. --Avoid QT prolonging agents --Continue to monitor on telemetry   Hypertension:  On lisinopril 2.5 mg p.o. daily and spironolactone 25 mg p.o. daily, and labetalol 200 mg p.o. twice daily, and triamterene-HCTZ 75-50 mg nightly at home. BP 116/77 this morning. --Continue to hold home antihypertensives --Continue aspirin and statin  Hyperlipidemia: Continue pravastatin 20 mg p.o. daily  GERD: Continue PPI  OSA:  Continue CPAP at night  Chronic pain syndrome --Continue home oxycodone-acetaminophen 5-325 mg q6h prn --Reduce  home gabapentin from 600 to 300 mg p.o. daily  Weakness, deconditioning, debility: --PT eval w/ no needs; recommend walker on discharge  DVT prophylaxis: Lovenox Code Status: Full code Family Communication: Updated patient extensively at bedside  Disposition Plan:  Status is: Inpatient  Remains inpatient appropriate because:Hemodynamically unstable, Altered mental status, Ongoing diagnostic testing needed not appropriate for outpatient work up, Unsafe d/c plan, IV treatments appropriate due to intensity of illness or inability to take PO and Inpatient level of care appropriate due to severity of illness   Dispo: The patient is from: Home              Anticipated d/c is to: Home              Anticipated d/c date is: 1 day              Patient currently is not medically stable to d/c.   Consultants:   None  Procedures:   None  Antimicrobials:   Remdesivir 9/2 - 9/6   Subjective: Patient seen and examined bedside, resting comfortably.  Sleeping but arousable.  Nausea and appetite improving.  Continues with weakness and fatigue.  Currently on room air with SPO2 95% at rest.  No other complaints or concerns at this time.  Denies headache, no dizziness, no chest pain, no palpitations, no abdominal pain.  No acute events overnight nursing staff.  Objective: Vitals:   01/05/20 2030 01/06/20 0515 01/06/20 1000 01/06/20 1100  BP: 126/78 114/77  122/72  Pulse: 69 (!) 53  (!) 55  Resp: (!) 24 18 (!) 27 18  Temp: 98.1 F (36.7 C) (!) 97.5 F (36.4 C)  (!) 97.4 F (36.3 C)  TempSrc:    Oral  SpO2: (!) 88% 93%  96%  Weight:      Height:       No intake or output data in the 24 hours ending 01/06/20 1104 Filed Weights   01/01/20 1940  Weight: 124.3 kg    Examination:  General exam: Appears calm and comfortable, ill in appearance Respiratory system: Coarse breath sounds bilaterally, slightly decreased bases, normal respiratory effort, on room air with SPO2 95% at  rest Cardiovascular system: S1 & S2 heard, RRR. No JVD, murmurs, rubs, gallops or clicks. No pedal edema. Gastrointestinal system: Abdomen is nondistended, soft and nontender. No organomegaly or masses felt. Normal bowel sounds heard. Central nervous system: Alert and oriented. No focal neurological deficits. Extremities: Symmetric 5 x 5 power. Skin: No rashes, lesions or ulcers Psychiatry: Judgement and insight appear normal.  Depressed mood, flat affect.    Data Reviewed: I have personally reviewed following labs and imaging studies  CBC: Recent Labs  Lab 01/01/20 2358 01/03/20 0408 01/04/20 0343 01/05/20 0440 01/06/20 0454  WBC 3.6* 5.2 6.4 9.4 11.1*  NEUTROABS 2.5 4.1 5.4 8.1* 9.3*  HGB 13.8 12.5 12.7 13.1 14.3  HCT 43.2 40.0 40.1 41.2 44.6  MCV 90.2 90.7 90.3 90.0 89.2  PLT 179 176 210 273 762   Basic Metabolic Panel: Recent Labs  Lab 01/01/20 2358 01/02/20 0635 01/03/20 0408 01/03/20 1731 01/04/20 0343 01/05/20 0440 01/06/20 0454  NA 138  --  137  --  138 140 142  K 3.2*  --  3.4*  --  4.3 4.0 4.3  CL 97*  --  99  --  103 105 105  CO2 25  --  26  --  26 26  24  GLUCOSE 114*  --  144*  --  168* 174* 206*  BUN 14  --  20  --  23* 18 20  CREATININE 1.25*  --  0.87  --  0.75 0.74 0.85  CALCIUM 8.6*  --  8.6*  --  8.4* 8.7* 9.3  MG  --  2.0  --  2.2 2.2  --   --    GFR: Estimated Creatinine Clearance: 98.5 mL/min (by C-G formula based on SCr of 0.85 mg/dL). Liver Function Tests: Recent Labs  Lab 01/03/20 0408 01/04/20 0343 01/05/20 0440 01/06/20 0454  AST 78* 80* 50* 68*  ALT 45* 59* 57* 86*  ALKPHOS 66 65 60 68  BILITOT 0.4 0.4 0.4 0.5  PROT 7.1 7.0 6.5 7.0  ALBUMIN 3.2* 3.0* 2.8* 3.4*   No results for input(s): LIPASE, AMYLASE in the last 168 hours. No results for input(s): AMMONIA in the last 168 hours. Coagulation Profile: No results for input(s): INR, PROTIME in the last 168 hours. Cardiac Enzymes: No results for input(s): CKTOTAL, CKMB,  CKMBINDEX, TROPONINI in the last 168 hours. BNP (last 3 results) No results for input(s): PROBNP in the last 8760 hours. HbA1C: No results for input(s): HGBA1C in the last 72 hours. CBG: Recent Labs  Lab 01/05/20 0739 01/05/20 1115 01/05/20 1752 01/05/20 2153 01/06/20 0739  GLUCAP 171* 157* 224* 193* 204*   Lipid Profile: No results for input(s): CHOL, HDL, LDLCALC, TRIG, CHOLHDL, LDLDIRECT in the last 72 hours. Thyroid Function Tests: No results for input(s): TSH, T4TOTAL, FREET4, T3FREE, THYROIDAB in the last 72 hours. Anemia Panel: No results for input(s): VITAMINB12, FOLATE, FERRITIN, TIBC, IRON, RETICCTPCT in the last 72 hours. Sepsis Labs: Recent Labs  Lab 01/02/20 0635  PROCALCITON <0.10    Recent Results (from the past 240 hour(s))  SARS Coronavirus 2 by RT PCR (hospital order, performed in [facility] hospital lab) Nasopharyngeal Nasopharyngeal Swab     Status: Abnormal   Collection Time: 01/01/20 11:58 PM   Specimen: Nasopharyngeal Swab  Result Value Ref Range Status   SARS Coronavirus 2 POSITIVE (A) NEGATIVE Final    Comment: RESULT CALLED TO, READ BACK BY AND VERIFIED WITH: RN B BROOKS AT 0209 01/02/20 CRUICKSHANK A (NOTE) SARS-CoV-2 target nucleic acids are DETECTED  SARS-CoV-2 RNA is generally detectable in upper respiratory specimens  during the acute phase of infection.  Positive results are indicative  of the presence of the identified virus, but do not rule out bacterial infection or co-infection with other pathogens not detected by the test.  Clinical correlation with patient history and  other diagnostic information is necessary to determine patient infection status.  The expected result is negative.  Fact Sheet for Patients:   StrictlyIdeas.no   Fact Sheet for Healthcare Providers:   BankingDealers.co.za    This test is not yet approved or cleared by the [place] FDA and  has been authorized  for detection and/or diagnosis of SARS-CoV-2 by FDA under an Emergency Use Authorization (EUA).  This EUA will remain in effect (meaning t his test can be used) for the duration of  the COVID-19 declaration under Section 564(b)(1) of the Act, 21 U.S.C. section 360-bbb-3(b)(1), unless the authorization is terminated or revoked sooner.  Performed at [facility], [place] [address], [place], [place] [address]   Culture, blood (Routine X 2) w Reflex to ID Panel     Status: None (Preliminary result)   Collection Time: 01/02/20  6:35 AM   Specimen:  BLOOD  Result Value Ref Range Status   Specimen Description   Final    BLOOD LEFT ANTECUBITAL Performed at [facility] [address], [place], [place] [address]    Special Requests   Final    BOTTLES DRAWN AEROBIC AND ANAEROBIC Blood Culture results may not be optimal due to an inadequate volume of blood received in culture bottles Performed at [facility] [address], [place], [place] [address]    Culture   Final    NO GROWTH 4 DAYS Performed at [facility], [place] [address], [place], [place] [address]    Report Status PENDING  Incomplete  Culture, blood (Routine X 2) w Reflex to ID Panel     Status: None (Preliminary result)   Collection Time: 01/02/20  7:07 PM   Specimen: BLOOD  Result Value Ref Range Status   Specimen Description   Final    BLOOD RIGHT ANTECUBITAL Performed at [facility] [address], [place], [place] [address]    Special Requests   Final    BOTTLES DRAWN AEROBIC AND ANAEROBIC Blood Culture adequate volume Performed at [facility] [address] [place], [place] [address]    Culture   Final    NO GROWTH 4 DAYS Performed at [facility], [place] [address] [place], [place] [address]    Report Status PENDING  Incomplete         Radiology Studies: No results found.      Scheduled Meds: .  vitamin C  500 mg Oral Daily  . aspirin EC  81 mg Oral Daily  . cholecalciferol  1,000 Units Oral Daily  . enoxaparin (LOVENOX) injection  60 mg Subcutaneous Q24H  . gabapentin  300 mg Oral Daily  . insulin aspart  0-15 Units Subcutaneous TID WC  . insulin aspart  0-5 Units Subcutaneous QHS  . loratadine  10 mg Oral Daily  . methylPREDNISolone (SOLU-MEDROL) injection  0.5 mg/kg Intravenous Q12H  . mometasone-formoterol  2 puff Inhalation BID  . pantoprazole  40 mg Oral Daily  . pravastatin  20 mg Oral QHS  . zinc sulfate  220 mg Oral Daily   Continuous Infusions:    LOS: 4 days    Time spent: 35 minutes spent on chart review, discussion with nursing staff, consultants, updating family and interview/physical exam; more than 50% of that time was spent in counseling and/or coordination of care.    Chaden Doom J [place] DO [facility] Available via Epic secure chat 7am-7pm After these hours, please refer to coverage provider listed on [email or web address] 01/06/2020, 11:04 AM

## 2020-01-06 NOTE — Progress Notes (Signed)
Physical Therapy Treatment Patient Details Name: [name] MRN: [id number] DOB: July 08, 1965 Today's Date: 01/06/2020    History of Present Illness [name] is a 54 year old female with past medical history notable for long QT syndrome, HTN, OSA on CPAP, history of PE, PVD, GERD, depression who presented with shortness of breath and cough.  Also associated fevers, chills, fatigue, body aches, poor oral intake.  Recently obtained her first dose of Pfizer vaccine 8 days prior.  Patient with recent exposure to Covid positive person.  Covid testing 3 days ago was positive.    PT Comments    Pt feeling better today per her report. AMb in room ~ 60' without device,  SpO2= 93% or greater on RA.  Discussed importance of progression of activity at home, importance of mobility. No f/u PT indicated at this time.   Follow Up Recommendations  No PT follow up     Equipment Recommendations  None recommended by PT (?3in1, RW only if pt desires, declined the need during PT )    Recommendations for Other Services       Precautions / Restrictions Precautions Precautions: None Restrictions Weight Bearing Restrictions: No    Mobility  Bed Mobility Overal bed mobility: Modified Independent Bed Mobility: Supine to Sit;Sit to Supine              Transfers Overall transfer level: Needs assistance   Transfers: Sit to/from Stand Sit to Stand: Min guard         General transfer comment: min/guard to close supervision from bed for safety   Ambulation/Gait Ambulation/Gait assistance: Min guard Gait Distance (Feet): 60 Feet (in room) Assistive device: None Gait Pattern/deviations: Step-through pattern;Decreased stride length;Wide base of support;Trendelenburg         Stairs             Wheelchair Mobility    Modified Rankin (Stroke Patients Only)       Balance   Sitting-balance support: Feet supported Sitting balance-Leahy Scale: Good     Standing  balance support: During functional activity Standing balance-Leahy Scale: Fair                              Cognition Arousal/Alertness: Awake/alert Behavior During Therapy: WFL for tasks assessed/performed Overall Cognitive Status: Within Functional Limits for tasks assessed                                        Exercises      General Comments        Pertinent Vitals/Pain Pain Assessment: No/denies pain    Home Living                      Prior Function            PT Goals (current goals can now be found in the care plan section) Acute Rehab PT Goals Patient Stated Goal: feel better PT Goal Formulation: With patient Time For Goal Achievement: 01/18/20 Potential to Achieve Goals: Good Progress towards PT goals: Progressing toward goals    Frequency    Min 3X/week      PT Plan Current plan remains appropriate    Co-evaluation              AM-PAC PT "6 Clicks" Mobility   Outcome Measure  Help needed turning  from your back to your side while in a flat bed without using bedrails?: None Help needed moving from lying on your back to sitting on the side of a flat bed without using bedrails?: None Help needed moving to and from a bed to a chair (including a wheelchair)?: None Help needed standing up from a chair using your arms (e.g., wheelchair or bedside chair)?: None Help needed to walk in hospital room?: A Little Help needed climbing 3-5 steps with a railing? : A Little 6 Click Score: 22    End of Session Equipment Utilized During Treatment: Gait belt Activity Tolerance: Patient tolerated treatment well Patient left: with call bell/phone within reach;in bed Nurse Communication: Mobility status PT Visit Diagnosis: Difficulty in walking, not elsewhere classified (R26.2);Muscle weakness (generalized) (M62.81)     Time: 6606-3016 PT Time Calculation (min) (ACUTE ONLY): 27 min  Charges:  $Gait Training: 23-37  mins                     Baxter Flattery, PT  Acute Rehab Dept ([place]) [phone] Pager [phone]  01/06/2020    [facility] 01/06/2020, 4:11 PM

## 2020-01-07 LAB — CBC WITH DIFFERENTIAL/PLATELET
Abs Immature Granulocytes: 0.25 10*3/uL — ABNORMAL HIGH (ref 0.00–0.07)
Basophils Absolute: 0 10*3/uL (ref 0.0–0.1)
Basophils Relative: 0 %
Eosinophils Absolute: 0 10*3/uL (ref 0.0–0.5)
Eosinophils Relative: 0 %
HCT: 43.1 % (ref 36.0–46.0)
Hemoglobin: 13.5 g/dL (ref 12.0–15.0)
Immature Granulocytes: 2 %
Lymphocytes Relative: 12 %
Lymphs Abs: 1.2 10*3/uL (ref 0.7–4.0)
MCH: 28.6 pg (ref 26.0–34.0)
MCHC: 31.3 g/dL (ref 30.0–36.0)
MCV: 91.3 fL (ref 80.0–100.0)
Monocytes Absolute: 0.6 10*3/uL (ref 0.1–1.0)
Monocytes Relative: 5 %
Neutro Abs: 8.5 10*3/uL — ABNORMAL HIGH (ref 1.7–7.7)
Neutrophils Relative %: 81 %
Platelets: 339 10*3/uL (ref 150–400)
RBC: 4.72 MIL/uL (ref 3.87–5.11)
RDW: 13.6 % (ref 11.5–15.5)
WBC: 10.5 10*3/uL (ref 4.0–10.5)
nRBC: 0 % (ref 0.0–0.2)

## 2020-01-07 LAB — COMPREHENSIVE METABOLIC PANEL
ALT: 61 U/L — ABNORMAL HIGH (ref 0–44)
AST: 31 U/L (ref 15–41)
Albumin: 2.8 g/dL — ABNORMAL LOW (ref 3.5–5.0)
Alkaline Phosphatase: 52 U/L (ref 38–126)
Anion gap: 9 (ref 5–15)
BUN: 19 mg/dL (ref 6–20)
CO2: 24 mmol/L (ref 22–32)
Calcium: 8.8 mg/dL — ABNORMAL LOW (ref 8.9–10.3)
Chloride: 104 mmol/L (ref 98–111)
Creatinine, Ser: 0.73 mg/dL (ref 0.44–1.00)
GFR calc Af Amer: >60 mL/min (ref >60)
GFR calc non Af Amer: >60 mL/min (ref >60)
Glucose, Bld: 210 mg/dL — ABNORMAL HIGH (ref 70–99)
Potassium: 4.4 mmol/L (ref 3.5–5.1)
Sodium: 137 mmol/L (ref 135–145)
Total Bilirubin: 0.3 mg/dL (ref 0.3–1.2)
Total Protein: 6.2 g/dL — ABNORMAL LOW (ref 6.5–8.1)

## 2020-01-07 LAB — CULTURE, BLOOD (ROUTINE X 2)
Culture: NO GROWTH
Culture: NO GROWTH
Special Requests: ADEQUATE

## 2020-01-07 LAB — GLUCOSE, CAPILLARY: Glucose-Capillary: 157 mg/dL — ABNORMAL HIGH (ref 70–99)

## 2020-01-07 LAB — D-DIMER, QUANTITATIVE: D-Dimer, Quant: 0.49 ug/mL-FEU (ref 0.00–0.50)

## 2020-01-07 LAB — C-REACTIVE PROTEIN: CRP: 0.7 mg/dL (ref <1.0)

## 2020-01-07 MED ORDER — PREDNISONE 10 MG PO TABS: ORAL_TABLET | ORAL | 0 refills | Status: AC

## 2020-01-07 MED ORDER — ZINC SULFATE 220 (50 ZN) MG PO CAPS: 220.0000 mg | ORAL_CAPSULE | Freq: Every day | ORAL | 0 refills | Status: AC

## 2020-01-07 MED ORDER — ASCORBIC ACID 500 MG PO TABS: 500.0000 mg | ORAL_TABLET | Freq: Every day | ORAL | 0 refills | Status: AC

## 2020-01-07 NOTE — Discharge Instructions (Signed)
COVID-19: How to Protect Yourself and Others Know how it spreads  There is currently no vaccine to prevent coronavirus disease 2019 (COVID-19).  The best way to prevent illness is to avoid being exposed to this virus.  The virus is thought to spread mainly from person-to-person. ? Between people who are in close contact with one another (within about 6 feet). ? Through respiratory droplets produced when an infected person coughs, sneezes or talks. ? These droplets can land in the mouths or noses of people who are nearby or possibly be inhaled into the lungs. ? COVID-19 may be spread by people who are not showing symptoms. Everyone should Clean your hands often  Wash your hands often with soap and water for at least 20 seconds especially after you have been in a public place, or after blowing your nose, coughing, or sneezing.  If soap and water are not readily available, use a hand sanitizer that contains at least 60% alcohol. Cover all surfaces of your hands and rub them together until they feel dry.  Avoid touching your eyes, nose, and mouth with unwashed hands. Avoid close contact  Limit contact with others as much as possible.  Avoid close contact with people who are sick.  Put distance between yourself and other people. ? Remember that some people without symptoms may be able to spread virus. ? This is especially important for people who are at higher risk of getting very GainPain.com.cy Cover your mouth and nose with a mask when around others  You could spread COVID-19 to others even if you do not feel sick.  Everyone should wear a mask in public settings and when around people not living in their household, especially when social distancing is difficult to maintain. ? Masks should not be placed on young children under age 9, anyone who has trouble breathing, or is unconscious, incapacitated or otherwise  unable to remove the mask without assistance.  The mask is meant to protect other people in case you are infected.  Do NOT use a facemask meant for a Dietitian.  Continue to keep about 6 feet between yourself and others. The mask is not a substitute for social distancing. Cover coughs and sneezes  Always cover your mouth and nose with a tissue when you cough or sneeze or use the inside of your elbow.  Throw used tissues in the trash.  Immediately wash your hands with soap and water for at least 20 seconds. If soap and water are not readily available, clean your hands with a hand sanitizer that contains at least 60% alcohol. Clean and disinfect  Clean AND disinfect frequently touched surfaces daily. This includes tables, doorknobs, light switches, countertops, handles, desks, phones, keyboards, toilets, faucets, and sinks. [email or web address]  If surfaces are dirty, clean them: Use detergent or soap and water prior to disinfection.  Then, use a household disinfectant. You can see a list of EPA-registered household disinfectants here. [email or web address] 01/02/2019 This information is not intended to replace advice given to you by your health care provider. Make sure you discuss any questions you have with your health care provider. Document Revised: 01/10/2019 Document Reviewed: 11/08/2018 Elsevier Patient Education  [place]: Quarantine vs. Isolation QUARANTINE keeps someone who was in close contact with someone who has COVID-19 away from others. If you had close contact with a person who has COVID-19  Stay home until 14 days after your last contact.  Check your temperature twice a day and watch for symptoms  of COVID-19.  If possible, stay away from people who are at higher-risk for getting very sick from COVID-19. ISOLATION keeps someone who is sick or tested positive for COVID-19 without  symptoms away from others, even in their own home. If you are sick and think or know you have COVID-19  Stay home until after ? At least 10 days since symptoms first appeared and ? At least 24 hours with no fever without fever-reducing medication and ? Symptoms have improved If you tested positive for COVID-19 but do not have symptoms  Stay home until after ? 10 days have passed since your positive test If you live with others, stay in a specific "sick room" or area and away from other people or animals, including pets. Use a separate bathroom, if available. [email or web address] 11/19/2018 This information is not intended to replace advice given to you by your health care provider. Make sure you discuss any questions you have with your health care provider. Document Revised: 04/04/2019 Document Reviewed: 04/04/2019 Elsevier Patient Education  [place].   COVID-19 Frequently Asked Questions COVID-19 (coronavirus disease) is an infection that is caused by a large family of viruses. Some viruses cause illness in people and others cause illness in animals like camels, cats, and bats. In some cases, the viruses that cause illness in animals can spread to humans. Where did the coronavirus come from? In December 2019, [place] told the [facility]) of several cases of lung disease (human respiratory illness). These cases were linked to an open seafood and livestock market in the city of [place]. The link to the seafood and livestock market suggests that the virus may have spread from animals to humans. However, since that first outbreak in December, the virus has also been shown to spread from person to person. What is the name of the disease and the virus? Disease name Early on, this disease was called novel coronavirus. This is because scientists determined that the disease was caused by a new (novel) respiratory virus. The World Health Organization [facility]) has now named the  disease COVID-19, or coronavirus disease. Virus name The virus that causes the disease is called severe acute respiratory syndrome coronavirus 2 (SARS-CoV-2). More information on disease and virus naming World Health Organization [facility]): www.who.int/emergencies/diseases/novel-coronavirus-2019/technical-guidance/naming-the-coronavirus-disease-(covid-2019)-and-the-virus-that-causes-it Who is at risk for complications from coronavirus disease? Some people may be at higher risk for complications from coronavirus disease. This includes older adults and people who have chronic diseases, such as heart disease, diabetes, and lung disease. If you are at higher risk for complications, take these extra precautions:  Stay home as much as possible.  Avoid social gatherings and travel.  Avoid close contact with others. Stay at least 6 ft (2 m) away from others, if possible.  Wash your hands often with soap and water for at least 20 seconds.  Avoid touching your face, mouth, nose, or eyes.  Keep supplies on hand at home, such as food, medicine, and cleaning supplies.  If you must go out in public, wear a cloth face covering or face mask. Make sure your mask covers your nose and mouth. How does coronavirus disease spread? The virus that causes coronavirus disease spreads easily from person to person (is contagious). You may catch the virus by:  Breathing in droplets from an infected person. Droplets can be spread by a person breathing, speaking, singing, coughing, or sneezing.  Touching something, like a table or a doorknob, that was exposed to the virus (contaminated) and then touching  your mouth, nose, or eyes. Can I get the virus from touching surfaces or objects? There is still a lot that we do not know about the virus that causes coronavirus disease. Scientists are basing a lot of information on what they know about similar viruses, such as:  Viruses cannot generally survive on surfaces for long.  They need a human body (host) to survive.  It is more likely that the virus is spread by close contact with people who are sick (direct contact), such as through: ? Shaking hands or hugging. ? Breathing in respiratory droplets that travel through the air. Droplets can be spread by a person breathing, speaking, singing, coughing, or sneezing.  It is less likely that the virus is spread when a person touches a surface or object that has the virus on it (indirect contact). The virus may be able to enter the body if the person touches a surface or object and then touches his or her face, eyes, nose, or mouth. Can a person spread the virus without having symptoms of the disease? It may be possible for the virus to spread before a person has symptoms of the disease, but this is most likely not the main way the virus is spreading. It is more likely for the virus to spread by being in close contact with people who are sick and breathing in the respiratory droplets spread by a person breathing, speaking, singing, coughing, or sneezing. What are the symptoms of coronavirus disease? Symptoms vary from person to person and can range from mild to severe. Symptoms may include:  Fever or chills.  Cough.  Difficulty breathing or feeling short of breath.  Headaches, body aches, or muscle aches.  Runny or stuffy (congested) nose.  Sore throat.  New loss of taste or smell.  Nausea, vomiting, or diarrhea. These symptoms can appear anywhere from 2 to 14 days after you have been exposed to the virus. Some people may not have any symptoms. If you develop symptoms, call your health care provider. People with severe symptoms may need hospital care. Should I be tested for this virus? Your health care provider will decide whether to test you based on your symptoms, history of exposure, and your risk factors. How does a health care provider test for this virus? Health care providers will collect samples to send  for testing. Samples may include:  Taking a swab of fluid from the back of your nose and throat, your nose, or your throat.  Taking fluid from the lungs by having you cough up mucus (sputum) into a sterile cup.  Taking a blood sample. Is there a treatment or vaccine for this virus? Currently, there is no vaccine to prevent coronavirus disease. Also, there are no medicines like antibiotics or antivirals to treat the virus. A person who becomes sick is given supportive care, which means rest and fluids. A person may also relieve his or her symptoms by using over-the-counter medicines that treat sneezing, coughing, and runny nose. These are the same medicines that a person takes for the common cold. If you develop symptoms, call your health care provider. People with severe symptoms may need hospital care. What can I do to protect myself and my family from this virus?     You can protect yourself and your family by taking the same actions that you would take to prevent the spread of other viruses. Take the following actions:  Wash your hands often with soap and water for at least 20  seconds. If soap and water are not available, use alcohol-based hand sanitizer.  Avoid touching your face, mouth, nose, or eyes.  Cough or sneeze into a tissue, sleeve, or elbow. Do not cough or sneeze into your hand or the air. ? If you cough or sneeze into a tissue, throw it away immediately and wash your hands.  Disinfect objects and surfaces that you frequently touch every day.  Stay away from people who are sick.  Avoid going out in public, follow guidance from your state and local health authorities.  Avoid crowded indoor spaces. Stay at least 6 ft (2 m) away from others.  If you must go out in public, wear a cloth face covering or face mask. Make sure your mask covers your nose and mouth.  Stay home if you are sick, except to get medical care. Call your health care provider before you get medical  care. Your health care provider will tell you how long to stay home.  Make sure your vaccines are up to date. Ask your health care provider what vaccines you need. What should I do if I need to travel? Follow travel recommendations from your local health authority, the CDC, and WHO. Travel information and advice  Centers for Disease Control and Prevention (CDC): BodyEditor.hu  World Health Organization [facility]): ThirdIncome.ca Know the risks and take action to protect your health  You are at higher risk of getting coronavirus disease if you are traveling to areas with an outbreak or if you are exposed to travelers from areas with an outbreak.  Wash your hands often and practice good hygiene to lower the risk of catching or spreading the virus. What should I do if I am sick? General instructions to stop the spread of infection  Wash your hands often with soap and water for at least 20 seconds. If soap and water are not available, use alcohol-based hand sanitizer.  Cough or sneeze into a tissue, sleeve, or elbow. Do not cough or sneeze into your hand or the air.  If you cough or sneeze into a tissue, throw it away immediately and wash your hands.  Stay home unless you must get medical care. Call your health care provider or local health authority before you get medical care.  Avoid public areas. Do not take public transportation, if possible.  If you can, wear a mask if you must go out of the house or if you are in close contact with someone who is not sick. Make sure your mask covers your nose and mouth. Keep your home clean  Disinfect objects and surfaces that are frequently touched every day. This may include: ? Counters and tables. ? Doorknobs and light switches. ? Sinks and faucets. ? Electronics such as phones, remote controls, keyboards, computers, and tablets.  Wash dishes in hot,  soapy water or use a dishwasher. Air-dry your dishes.  Wash laundry in hot water. Prevent infecting other household members  Let healthy household members care for children and pets, if possible. If you have to care for children or pets, wash your hands often and wear a mask.  Sleep in a different bedroom or bed, if possible.  Do not share personal items, such as razors, toothbrushes, deodorant, combs, brushes, towels, and washcloths. Where to find more information Centers for Disease Control and Prevention (CDC)  Information and news updates: https://www.butler-gonzalez.com/ World Health Organization [facility])  Information and news updates: MissExecutive.com.ee  Coronavirus health topic: https://www.castaneda.info/  Questions and answers on COVID-19: OpportunityDebt.at  Global tracker: who.sprinklr.com American Academy of Pediatrics (AAP)  Information for families: www.healthychildren.org/English/health-issues/conditions/chest-lungs/Pages/2019-Novel-Coronavirus.aspx The coronavirus situation is changing rapidly. Check your local health authority website or the CDC and [facility] websites for updates and news. When should I contact a health care provider?  Contact your health care provider if you have symptoms of an infection, such as fever or cough, and you: ? Have been near anyone who is known to have coronavirus disease. ? Have come into contact with a person who is suspected to have coronavirus disease. ? Have traveled to an area where there is an outbreak of COVID-19. When should I get emergency medical care?  Get help right away by calling your local emergency services (911 in the [place] if you have: ? Trouble breathing. ? Pain or pressure in your chest. ? Confusion. ? Blue-tinged lips and fingernails. ? Difficulty waking from sleep. ? Symptoms that get worse. Let the emergency medical personnel know if  you think you have coronavirus disease. Summary  A new respiratory virus is spreading from person to person and causing COVID-19 (coronavirus disease).  The virus that causes COVID-19 appears to spread easily. It spreads from one person to another through droplets from breathing, speaking, singing, coughing, or sneezing.  Older adults and those with chronic diseases are at higher risk of disease. If you are at higher risk for complications, take extra precautions.  There is currently no vaccine to prevent coronavirus disease. There are no medicines, such as antibiotics or antivirals, to treat the virus.  You can protect yourself and your family by washing your hands often, avoiding touching your face, and covering your coughs and sneezes. This information is not intended to replace advice given to you by your health care provider. Make sure you discuss any questions you have with your health care provider. Document Revised: 02/15/2019 Document Reviewed: 08/14/2018 Elsevier Patient Education  2020 [place].   COVID-19 COVID-19 is a respiratory infection that is caused by a virus called severe acute respiratory syndrome coronavirus 2 (SARS-CoV-2). The disease is also known as coronavirus disease or novel coronavirus. In some people, the virus may not cause any symptoms. In others, it may cause a serious infection. The infection can get worse quickly and can lead to complications, such as:  Pneumonia, or infection of the lungs.  Acute respiratory distress syndrome or ARDS. This is a condition in which fluid build-up in the lungs prevents the lungs from filling with air and passing oxygen into the blood.  Acute respiratory failure. This is a condition in which there is not enough oxygen passing from the lungs to the body or when carbon dioxide is not passing from the lungs out of the body.  Sepsis or septic shock. This is a serious bodily reaction to an infection.  Blood clotting  problems.  Secondary infections due to bacteria or fungus.  Organ failure. This is when your body's organs stop working. The virus that causes COVID-19 is contagious. This means that it can spread from person to person through droplets from coughs and sneezes (respiratory secretions). What are the causes? This illness is caused by a virus. You may catch the virus by:  Breathing in droplets from an infected person. Droplets can be spread by a person breathing, speaking, singing, coughing, or sneezing.  Touching something, like a table or a doorknob, that was exposed to the virus (contaminated) and then touching your mouth, nose, or eyes. What increases the risk? Risk for infection You are more likely to  be infected with this virus if you:  Are within 6 feet (2 meters) of a person with COVID-19.  Provide care for or live with a person who is infected with COVID-19.  Spend time in crowded indoor spaces or live in shared housing. Risk for serious illness You are more likely to become seriously ill from the virus if you:  Are 29 years of age or older. The higher your age, the more you are at risk for serious illness.  Live in a nursing home or long-term care facility.  Have cancer.  Have a long-term (chronic) disease such as: ? Chronic lung disease, including chronic obstructive pulmonary disease or asthma. ? A long-term disease that lowers your body's ability to fight infection (immunocompromised). ? Heart disease, including heart failure, a condition in which the arteries that lead to the heart become narrow or blocked (coronary artery disease), a disease which makes the heart muscle thick, weak, or stiff (cardiomyopathy). ? Diabetes. ? Chronic kidney disease. ? Sickle cell disease, a condition in which red blood cells have an abnormal "sickle" shape. ? Liver disease.  Are obese. What are the signs or symptoms? Symptoms of this condition can range from mild to severe. Symptoms  may appear any time from 2 to 14 days after being exposed to the virus. They include:  A fever or chills.  A cough.  Difficulty breathing.  Headaches, body aches, or muscle aches.  Runny or stuffy (congested) nose.  A sore throat.  New loss of taste or smell. Some people may also have stomach problems, such as nausea, vomiting, or diarrhea. Other people may not have any symptoms of COVID-19. How is this diagnosed? This condition may be diagnosed based on:  Your signs and symptoms, especially if: ? You live in an area with a COVID-19 outbreak. ? You recently traveled to or from an area where the virus is common. ? You provide care for or live with a person who was diagnosed with COVID-19. ? You were exposed to a person who was diagnosed with COVID-19.  A physical exam.  Lab tests, which may include: ? Taking a sample of fluid from the back of your nose and throat (nasopharyngeal fluid), your nose, or your throat using a swab. ? A sample of mucus from your lungs (sputum). ? Blood tests.  Imaging tests, which may include, X-rays, CT scan, or ultrasound. How is this treated? At present, there is no medicine to treat COVID-19. Medicines that treat other diseases are being used on a trial basis to see if they are effective against COVID-19. Your health care provider will talk with you about ways to treat your symptoms. For most people, the infection is mild and can be managed at home with rest, fluids, and over-the-counter medicines. Treatment for a serious infection usually takes places in a hospital intensive care unit (ICU). It may include one or more of the following treatments. These treatments are given until your symptoms improve.  Receiving fluids and medicines through an IV.  Supplemental oxygen. Extra oxygen is given through a tube in the nose, a face mask, or a hood.  Positioning you to lie on your stomach (prone position). This makes it easier for oxygen to get into  the lungs.  Continuous positive airway pressure (CPAP) or bi-level positive airway pressure (BPAP) machine. This treatment uses mild air pressure to keep the airways open. A tube that is connected to a motor delivers oxygen to the body.  Ventilator. This treatment moves  air into and out of the lungs by using a tube that is placed in your windpipe.  Tracheostomy. This is a procedure to create a hole in the neck so that a breathing tube can be inserted.  Extracorporeal membrane oxygenation (ECMO). This procedure gives the lungs a chance to recover by taking over the functions of the heart and lungs. It supplies oxygen to the body and removes carbon dioxide. Follow these instructions at home: Lifestyle  If you are sick, stay home except to get medical care. Your health care provider will tell you how long to stay home. Call your health care provider before you go for medical care.  Rest at home as told by your health care provider.  Do not use any products that contain nicotine or tobacco, such as cigarettes, e-cigarettes, and chewing tobacco. If you need help quitting, ask your health care provider.  Return to your normal activities as told by your health care provider. Ask your health care provider what activities are safe for you. General instructions  Take over-the-counter and prescription medicines only as told by your health care provider.  Drink enough fluid to keep your urine pale yellow.  Keep all follow-up visits as told by your health care provider. This is important. How is this prevented?  There is no vaccine to help prevent COVID-19 infection. However, there are steps you can take to protect yourself and others from this virus. To protect yourself:   Do not travel to areas where COVID-19 is a risk. The areas where COVID-19 is reported change often. To identify high-risk areas and travel restrictions, check the CDC travel website: FatFares.com.br  If you live  in, or must travel to, an area where COVID-19 is a risk, take precautions to avoid infection. ? Stay away from people who are sick. ? Wash your hands often with soap and water for 20 seconds. If soap and water are not available, use an alcohol-based hand sanitizer. ? Avoid touching your mouth, face, eyes, or nose. ? Avoid going out in public, follow guidance from your state and local health authorities. ? If you must go out in public, wear a cloth face covering or face mask. Make sure your mask covers your nose and mouth. ? Avoid crowded indoor spaces. Stay at least 6 feet (2 meters) away from others. ? Disinfect objects and surfaces that are frequently touched every day. This may include:  Counters and tables.  Doorknobs and light switches.  Sinks and faucets.  Electronics, such as phones, remote controls, keyboards, computers, and tablets. To protect others: If you have symptoms of COVID-19, take steps to prevent the virus from spreading to others.  If you think you have a COVID-19 infection, contact your health care provider right away. Tell your health care team that you think you may have a COVID-19 infection.  Stay home. Leave your house only to seek medical care. Do not use public transport.  Do not travel while you are sick.  Wash your hands often with soap and water for 20 seconds. If soap and water are not available, use alcohol-based hand sanitizer.  Stay away from other members of your household. Let healthy household members care for children and pets, if possible. If you have to care for children or pets, wash your hands often and wear a mask. If possible, stay in your own room, separate from others. Use a different bathroom.  Make sure that all people in your household wash their hands well and often.  Cough or sneeze into a tissue or your sleeve or elbow. Do not cough or sneeze into your hand or into the air.  Wear a cloth face covering or face mask. Make sure your  mask covers your nose and mouth. Where to find more information  Centers for Disease Control and Prevention: PurpleGadgets.be  World Health Organization: https://www.castaneda.info/ Contact a health care provider if:  You live in or have traveled to an area where COVID-19 is a risk and you have symptoms of the infection.  You have had contact with someone who has COVID-19 and you have symptoms of the infection. Get help right away if:  You have trouble breathing.  You have pain or pressure in your chest.  You have confusion.  You have bluish lips and fingernails.  You have difficulty waking from sleep.  You have symptoms that get worse. These symptoms may represent a serious problem that is an emergency. Do not wait to see if the symptoms will go away. Get medical help right away. Call your local emergency services (911 in the [place] Do not drive yourself to the hospital. Let the emergency medical personnel know if you think you have COVID-19. Summary  COVID-19 is a respiratory infection that is caused by a virus. It is also known as coronavirus disease or novel coronavirus. It can cause serious infections, such as pneumonia, acute respiratory distress syndrome, acute respiratory failure, or sepsis.  The virus that causes COVID-19 is contagious. This means that it can spread from person to person through droplets from breathing, speaking, singing, coughing, or sneezing.  You are more likely to develop a serious illness if you are 4 years of age or older, have a weak immune system, live in a nursing home, or have chronic disease.  There is no medicine to treat COVID-19. Your health care provider will talk with you about ways to treat your symptoms.  Take steps to protect yourself and others from infection. Wash your hands often and disinfect objects and surfaces that are frequently touched every day. Stay away from people who are sick and wear  a mask if you are sick. This information is not intended to replace advice given to you by your health care provider. Make sure you discuss any questions you have with your health care provider. Document Revised: 02/15/2019 Document Reviewed: 05/24/2018 Elsevier Patient Education  2020 [employer].   COVID-19 COVID-19 is a respiratory infection that is caused by a virus called severe acute respiratory syndrome coronavirus 2 (SARS-CoV-2). The disease is also known as coronavirus disease or novel coronavirus. In some people, the virus may not cause any symptoms. In others, it may cause a serious infection. The infection can get worse quickly and can lead to complications, such as:  Pneumonia, or infection of the lungs.  Acute respiratory distress syndrome or ARDS. This is a condition in which fluid build-up in the lungs prevents the lungs from filling with air and passing oxygen into the blood.  Acute respiratory failure. This is a condition in which there is not enough oxygen passing from the lungs to the body or when carbon dioxide is not passing from the lungs out of the body.  Sepsis or septic shock. This is a serious bodily reaction to an infection.  Blood clotting problems.  Secondary infections due to bacteria or fungus.  Organ failure. This is when your body's organs stop working. The virus that causes COVID-19 is contagious. This means that it can spread from person to  person through droplets from coughs and sneezes (respiratory secretions). What are the causes? This illness is caused by a virus. You may catch the virus by:  Breathing in droplets from an infected person. Droplets can be spread by a person breathing, speaking, singing, coughing, or sneezing.  Touching something, like a table or a doorknob, that was exposed to the virus (contaminated) and then touching your mouth, nose, or eyes. What increases the risk? Risk for infection You are more likely to be infected with  this virus if you:  Are within 6 feet (2 meters) of a person with COVID-19.  Provide care for or live with a person who is infected with COVID-19.  Spend time in crowded indoor spaces or live in shared housing. Risk for serious illness You are more likely to become seriously ill from the virus if you:  Are 39 years of age or older. The higher your age, the more you are at risk for serious illness.  Live in a nursing home or long-term care facility.  Have cancer.  Have a long-term (chronic) disease such as: ? Chronic lung disease, including chronic obstructive pulmonary disease or asthma. ? A long-term disease that lowers your body's ability to fight infection (immunocompromised). ? Heart disease, including heart failure, a condition in which the arteries that lead to the heart become narrow or blocked (coronary artery disease), a disease which makes the heart muscle thick, weak, or stiff (cardiomyopathy). ? Diabetes. ? Chronic kidney disease. ? Sickle cell disease, a condition in which red blood cells have an abnormal "sickle" shape. ? Liver disease.  Are obese. What are the signs or symptoms? Symptoms of this condition can range from mild to severe. Symptoms may appear any time from 2 to 14 days after being exposed to the virus. They include:  A fever or chills.  A cough.  Difficulty breathing.  Headaches, body aches, or muscle aches.  Runny or stuffy (congested) nose.  A sore throat.  New loss of taste or smell. Some people may also have stomach problems, such as nausea, vomiting, or diarrhea. Other people may not have any symptoms of COVID-19. How is this diagnosed? This condition may be diagnosed based on:  Your signs and symptoms, especially if: ? You live in an area with a COVID-19 outbreak. ? You recently traveled to or from an area where the virus is common. ? You provide care for or live with a person who was diagnosed with COVID-19. ? You were exposed to a  person who was diagnosed with COVID-19.  A physical exam.  Lab tests, which may include: ? Taking a sample of fluid from the back of your nose and throat (nasopharyngeal fluid), your nose, or your throat using a swab. ? A sample of mucus from your lungs (sputum). ? Blood tests.  Imaging tests, which may include, X-rays, CT scan, or ultrasound. How is this treated? At present, there is no medicine to treat COVID-19. Medicines that treat other diseases are being used on a trial basis to see if they are effective against COVID-19. Your health care provider will talk with you about ways to treat your symptoms. For most people, the infection is mild and can be managed at home with rest, fluids, and over-the-counter medicines. Treatment for a serious infection usually takes places in a hospital intensive care unit (ICU). It may include one or more of the following treatments. These treatments are given until your symptoms improve.  Receiving fluids and medicines through an  IV.  Supplemental oxygen. Extra oxygen is given through a tube in the nose, a face mask, or a hood.  Positioning you to lie on your stomach (prone position). This makes it easier for oxygen to get into the lungs.  Continuous positive airway pressure (CPAP) or bi-level positive airway pressure (BPAP) machine. This treatment uses mild air pressure to keep the airways open. A tube that is connected to a motor delivers oxygen to the body.  Ventilator. This treatment moves air into and out of the lungs by using a tube that is placed in your windpipe.  Tracheostomy. This is a procedure to create a hole in the neck so that a breathing tube can be inserted.  Extracorporeal membrane oxygenation (ECMO). This procedure gives the lungs a chance to recover by taking over the functions of the heart and lungs. It supplies oxygen to the body and removes carbon dioxide. Follow these instructions at home: Lifestyle  If you are sick, stay  home except to get medical care. Your health care provider will tell you how long to stay home. Call your health care provider before you go for medical care.  Rest at home as told by your health care provider.  Do not use any products that contain nicotine or tobacco, such as cigarettes, e-cigarettes, and chewing tobacco. If you need help quitting, ask your health care provider.  Return to your normal activities as told by your health care provider. Ask your health care provider what activities are safe for you. General instructions  Take over-the-counter and prescription medicines only as told by your health care provider.  Drink enough fluid to keep your urine pale yellow.  Keep all follow-up visits as told by your health care provider. This is important. How is this prevented?  There is no vaccine to help prevent COVID-19 infection. However, there are steps you can take to protect yourself and others from this virus. To protect yourself:   Do not travel to areas where COVID-19 is a risk. The areas where COVID-19 is reported change often. To identify high-risk areas and travel restrictions, check the CDC travel website: FatFares.com.br  If you live in, or must travel to, an area where COVID-19 is a risk, take precautions to avoid infection. ? Stay away from people who are sick. ? Wash your hands often with soap and water for 20 seconds. If soap and water are not available, use an alcohol-based hand sanitizer. ? Avoid touching your mouth, face, eyes, or nose. ? Avoid going out in public, follow guidance from your state and local health authorities. ? If you must go out in public, wear a cloth face covering or face mask. Make sure your mask covers your nose and mouth. ? Avoid crowded indoor spaces. Stay at least 6 feet (2 meters) away from others. ? Disinfect objects and surfaces that are frequently touched every day. This may include:  Counters and tables.  Doorknobs  and light switches.  Sinks and faucets.  Electronics, such as phones, remote controls, keyboards, computers, and tablets. To protect others: If you have symptoms of COVID-19, take steps to prevent the virus from spreading to others.  If you think you have a COVID-19 infection, contact your health care provider right away. Tell your health care team that you think you may have a COVID-19 infection.  Stay home. Leave your house only to seek medical care. Do not use public transport.  Do not travel while you are sick.  Wash your hands often  with soap and water for 20 seconds. If soap and water are not available, use alcohol-based hand sanitizer.  Stay away from other members of your household. Let healthy household members care for children and pets, if possible. If you have to care for children or pets, wash your hands often and wear a mask. If possible, stay in your own room, separate from others. Use a different bathroom.  Make sure that all people in your household wash their hands well and often.  Cough or sneeze into a tissue or your sleeve or elbow. Do not cough or sneeze into your hand or into the air.  Wear a cloth face covering or face mask. Make sure your mask covers your nose and mouth. Where to find more information  Centers for Disease Control and Prevention: PurpleGadgets.be  World Health Organization: https://www.castaneda.info/ Contact a health care provider if:  You live in or have traveled to an area where COVID-19 is a risk and you have symptoms of the infection.  You have had contact with someone who has COVID-19 and you have symptoms of the infection. Get help right away if:  You have trouble breathing.  You have pain or pressure in your chest.  You have confusion.  You have bluish lips and fingernails.  You have difficulty waking from sleep.  You have symptoms that get worse. These symptoms may represent a serious  problem that is an emergency. Do not wait to see if the symptoms will go away. Get medical help right away. Call your local emergency services (911 in the [place] Do not drive yourself to the hospital. Let the emergency medical personnel know if you think you have COVID-19. Summary  COVID-19 is a respiratory infection that is caused by a virus. It is also known as coronavirus disease or novel coronavirus. It can cause serious infections, such as pneumonia, acute respiratory distress syndrome, acute respiratory failure, or sepsis.  The virus that causes COVID-19 is contagious. This means that it can spread from person to person through droplets from breathing, speaking, singing, coughing, or sneezing.  You are more likely to develop a serious illness if you are 46 years of age or older, have a weak immune system, live in a nursing home, or have chronic disease.  There is no medicine to treat COVID-19. Your health care provider will talk with you about ways to treat your symptoms.  Take steps to protect yourself and others from infection. Wash your hands often and disinfect objects and surfaces that are frequently touched every day. Stay away from people who are sick and wear a mask if you are sick. This information is not intended to replace advice given to you by your health care provider. Make sure you discuss any questions you have with your health care provider. Document Revised: 02/15/2019 Document Reviewed: 05/24/2018 Elsevier Patient Education  [place] Can Do to Manage Your COVID-19 Symptoms at Home If you have possible or confirmed COVID-19: 1. Stay home from work and school. And stay away from other public places. If you must go out, avoid using any kind of public transportation, ridesharing, or taxis. 2. Monitor your symptoms carefully. If your symptoms get worse, call your healthcare provider immediately. 3. Get rest and stay hydrated. 4. If you have a medical  appointment, call the healthcare provider ahead of time and tell them that you have or may have COVID-19. 5. For medical emergencies, call 911 and notify the dispatch personnel that  you have or may have COVID-19. 6. Cover your cough and sneezes with a tissue or use the inside of your elbow. 7. Wash your hands often with soap and water for at least 20 seconds or clean your hands with an alcohol-based hand sanitizer that contains at least 60% alcohol. 8. As much as possible, stay in a specific room and away from other people in your home. Also, you should use a separate bathroom, if available. If you need to be around other people in or outside of the home, wear a mask. 9. Avoid sharing personal items with other people in your household, like dishes, towels, and bedding. 10. Clean all surfaces that are touched often, like counters, tabletops, and doorknobs. Use household cleaning sprays or wipes according to the label instructions. [email or web address] 10/31/2018 This information is not intended to replace advice given to you by your health care provider. Make sure you discuss any questions you have with your health care provider. Document Revised: 04/04/2019 Document Reviewed: 04/04/2019 Elsevier Patient Education  [place] Under Monitoring Name: [name]  Location: [address] [place] [address]   Infection Prevention Recommendations for Individuals Confirmed to have, or Being Evaluated for, 2019 Novel Coronavirus (COVID-19) Infection Who Receive Care at Home  Individuals who are confirmed to have, or are being evaluated for, COVID-19 should follow the prevention steps below until a healthcare provider or local or state health department says they can return to normal activities.  Stay home except to get medical care You should restrict activities outside your home, except for getting medical care. Do not go to work, school, or public areas, and do not  use public transportation or taxis.  Call ahead before visiting your doctor Before your medical appointment, call the healthcare provider and tell them that you have, or are being evaluated for, COVID-19 infection. This will help the healthcare provider's office take steps to keep other people from getting infected. Ask your healthcare provider to call the local or state health department.  Monitor your symptoms Seek prompt medical attention if your illness is worsening (e.g., difficulty breathing). Before going to your medical appointment, call the healthcare provider and tell them that you have, or are being evaluated for, COVID-19 infection. Ask your healthcare provider to call the local or state health department.  Wear a facemask You should wear a facemask that covers your nose and mouth when you are in the same room with other people and when you visit a healthcare provider. People who live with or visit you should also wear a facemask while they are in the same room with you.  Separate yourself from other people in your home As much as possible, you should stay in a different room from other people in your home. Also, you should use a separate bathroom, if available.  Avoid sharing household items You should not share dishes, drinking glasses, cups, eating utensils, towels, bedding, or other items with other people in your home. After using these items, you should wash them thoroughly with soap and water.  Cover your coughs and sneezes Cover your mouth and nose with a tissue when you cough or sneeze, or you can cough or sneeze into your sleeve. Throw used tissues in a lined trash can, and immediately wash your hands with soap and water for at least 20 seconds or use an alcohol-based hand rub.  Wash your [facility] your hands often and thoroughly with soap and water for  at least 20 seconds. You can use an alcohol-based hand sanitizer if soap and water are not available and if  your hands are not visibly dirty. Avoid touching your eyes, nose, and mouth with unwashed hands.   Prevention Steps for Caregivers and Household Members of Individuals Confirmed to have, or Being Evaluated for, COVID-19 Infection Being Cared for in the Home  If you live with, or provide care at home for, a person confirmed to have, or being evaluated for, COVID-19 infection please follow these guidelines to prevent infection:  Follow healthcare provider's instructions Make sure that you understand and can help the patient follow any healthcare provider instructions for all care.  Provide for the patient's basic needs You should help the patient with basic needs in the home and provide support for getting groceries, prescriptions, and other personal needs.  Monitor the patient's symptoms If they are getting sicker, call his or her medical provider and tell them that the patient has, or is being evaluated for, COVID-19 infection. This will help the healthcare provider's office take steps to keep other people from getting infected. Ask the healthcare provider to call the local or state health department.  Limit the number of people who have contact with the patient  If possible, have only one caregiver for the patient.  Other household members should stay in another home or place of residence. If this is not possible, they should stay  in another room, or be separated from the patient as much as possible. Use a separate bathroom, if available.  Restrict visitors who do not have an essential need to be in the home.  Keep older adults, very young children, and other sick people away from the patient Keep older adults, very young children, and those who have compromised immune systems or chronic health conditions away from the patient. This includes people with chronic heart, lung, or kidney conditions, diabetes, and cancer.  Ensure good ventilation Make sure that shared spaces in the  home have good air flow, such as from an air conditioner or an opened window, weather permitting.  Wash your hands often  Wash your hands often and thoroughly with soap and water for at least 20 seconds. You can use an alcohol based hand sanitizer if soap and water are not available and if your hands are not visibly dirty.  Avoid touching your eyes, nose, and mouth with unwashed hands.  Use disposable paper towels to dry your hands. If not available, use dedicated cloth towels and replace them when they become wet.  Wear a facemask and gloves  Wear a disposable facemask at all times in the room and gloves when you touch or have contact with the patient's blood, body fluids, and/or secretions or excretions, such as sweat, saliva, sputum, nasal mucus, vomit, urine, or feces.  Ensure the mask fits over your nose and mouth tightly, and do not touch it during use.  Throw out disposable facemasks and gloves after using them. Do not reuse.  Wash your hands immediately after removing your facemask and gloves.  If your personal clothing becomes contaminated, carefully remove clothing and launder. Wash your hands after handling contaminated clothing.  Place all used disposable facemasks, gloves, and other waste in a lined container before disposing them with other household waste.  Remove gloves and wash your hands immediately after handling these items.  Do not share dishes, glasses, or other household items with the patient  Avoid sharing household items. You should not share dishes, drinking  glasses, cups, eating utensils, towels, bedding, or other items with a patient who is confirmed to have, or being evaluated for, COVID-19 infection.  After the person uses these items, you should wash them thoroughly with soap and water.  Wash laundry thoroughly  Immediately remove and wash clothes or bedding that have blood, body fluids, and/or secretions or excretions, such as sweat, saliva, sputum,  nasal mucus, vomit, urine, or feces, on them.  Wear gloves when handling laundry from the patient.  Read and follow directions on labels of laundry or clothing items and detergent. In general, wash and dry with the warmest temperatures recommended on the label.  Clean all areas the individual has used often  Clean all touchable surfaces, such as counters, tabletops, doorknobs, bathroom fixtures, toilets, phones, keyboards, tablets, and bedside tables, every day. Also, clean any surfaces that may have blood, body fluids, and/or secretions or excretions on them.  Wear gloves when cleaning surfaces the patient has come in contact with.  Use a diluted bleach solution (e.g., dilute bleach with 1 part bleach and 10 parts water) or a household disinfectant with a label that says EPA-registered for coronaviruses. To make a bleach solution at home, add 1 tablespoon of bleach to 1 quart (4 cups) of water. For a larger supply, add  cup of bleach to 1 gallon (16 cups) of water.  Read labels of cleaning products and follow recommendations provided on product labels. Labels contain instructions for safe and effective use of the cleaning product including precautions you should take when applying the product, such as wearing gloves or eye protection and making sure you have good ventilation during use of the product.  Remove gloves and wash hands immediately after cleaning.  Monitor yourself for signs and symptoms of illness Caregivers and household members are considered close contacts, should monitor their health, and will be asked to limit movement outside of the home to the extent possible. Follow the monitoring steps for close contacts listed on the symptom monitoring form.   ? If you have additional questions, contact your local health department or call the epidemiologist on call at [phone] (available 24/7). ? This guidance is subject to change. For the most up-to-date guidance from [facility],  please refer to their website: [email or web address]

## 2020-01-07 NOTE — Progress Notes (Signed)
Pt is being discharged home today. Discharge instructions including medications and follow up appts given. Pt had no further questions at this time.

## 2020-01-07 NOTE — Discharge Summary (Signed)
Physician Discharge Summary  [name] EXB:[id number] DOB: 1965/11/01 DOA: 01/01/2020  PCP: [name], NP  Admit date: 01/01/2020 Discharge date: 01/07/2020  Admitted From: Home Disposition: Home  Recommendations for Outpatient Follow-up:  1. Follow up with PCP in 1-2 weeks 2. Continue prednisone taper on discharge 3. Continue supportive care with the same, vitamin C, albuterol MDI as needed 4. Encourage patient to receive Covid-19 vaccination 5. Discontinued home Maxide and spironolactone for hypotension on admission 6. Discontinued home potassium supplement with potassium 4.4 at time of discharge. 7. Recommend BMP at next PCP visit  Home Health: No Equipment/Devices: None  Discharge Condition: Stable CODE STATUS: Full code Diet recommendation: Heart healthy/consistent carbohydrate diet  History of present illness:  [name] is a 54 year old female with past medical history notable for long QT syndrome, HTN, OSA on CPAP, history of PE, PVD, GERD, depression who presented with shortness of breath and cough.  Also associated fevers, chills, fatigue, body aches, poor oral intake.  Recently obtained her first dose of Pfizer vaccine 8 days prior.  Patient with recent exposure to Covid positive person.  Covid testing 3 days ago was positive.  In the ED, febrile with temperature 100.4 F. Oxygen saturation as low as 92% on room air. Tachypneic with respiratory rate in the 20s to 30s. SARS-CoV-2 PCR test positive. WBC 3.6, hemoglobin 13.8, hematocrit 43.2, platelet 179. Sodium 138, potassium 3.2, chloride 97, bicarb 25, BUN 14, creatinine 1.2, glucose 114. Chest x-ray showing bilateral patchy airspace disease consistent with multifocal pneumonia. Patient was given Tylenol, Decadron, remdesivir, Tussionex, and 40 mEq p.o. potassium.   Hospital course:  Sepsis, POA Acute hypoxic respiratory failure secondary to acute Covid-19 viral pneumonia during the ongoing 2020/2021  Covid 19 Pandemic - POA Patient presenting with progressive shortness of breath, fevers, chills and myalgias.  Patient was noted to have a temperature of 100.4 F tachypnea with hypoxia requiring supplemental oxygen.  Recently received first dose Pfizer vaccine 1 week prior with known Covid exposure thereafter.  Procalcitonin less than 0.10.  Chest x-ray with multifocal pneumonia. COVID test: PCR + 9/1/202.  Patient was started on remdesivir to complete a 5-day course.  Patient also was started on high-dose IV steroids which was slowly titrated down during hospitalization with improvement of her inflammatory markers.  Patient will continue prednisone taper on discharge.  Also patient required supplemental oxygen which was eventually titrated off at time of discharge.  No oxygen desaturation was appreciated with exertion multiple evaluations at time of discharge.  Continue supportive care with zinc, vitamin C, albuterol MDI as needed.  Continue home isolation for 14 days from initial date of diagnosis.  Recommend patient received Covid-19 vaccination following home isolation period.   The treatment plan and use of medications and known side effects were discussed with patient/family. Some of the medications used are based on case reports/anecdotal data.  All other medications being used in the management of COVID-19 based on limited study data.  Complete risks and long-term side effects are unknown, however in the best clinical judgment they seem to be of some benefit.  Patient wanted to proceed with treatment options provided.  Type 2 diabetes mellitus Hemoglobin A1c 6.3, well controlled.    Continue home Metformin 500 mg p.o. daily, Ozempic 61m [place] q7d at home.  History oflong QT syndrome QTc 530.  Recommend to avoid QT prolonging agents  Hypertension:  On lisinopril 2.5 mg p.o. daily and spironolactone 25 mg p.o. daily, and labetalol 200 mg  p.o. twice daily, and triamterene-HCTZ 75-50 mg nightly  at home.  Patient with borderline hypotension throughout the hospitalization.  Initially all of her antihypertensives were held.  Will resume lisinopril and labetalol on discharge.  Discontinue spironolactone and Maxide.  Follow-up with PCP for further guidance.  Continue aspirin and statin.  BP 05/25/1976 at time of discharge.  Hyperlipidemia: Continue pravastatin 20 mg p.o. daily  GERD: Continue PPI  OSA:  Continue CPAP at night  Chronic pain syndrome Continue home oxycodone-acetaminophen 5-325 mg q6h prn and gabapentin   Weakness, deconditioning, debility: Evaluated by physical therapy during hospitalization with no needs identified.  Discharge Diagnoses:  Principal Problem:   OSA on CPAP Active Problems:   Long Q-T syndrome   Pneumonia due to COVID-19 virus    Discharge Instructions  Discharge Instructions    Call MD for:  difficulty breathing, headache or visual disturbances   Complete by: As directed    Call MD for:  extreme fatigue   Complete by: As directed    Call MD for:  persistant dizziness or light-headedness   Complete by: As directed    Call MD for:  persistant nausea and vomiting   Complete by: As directed    Call MD for:  severe uncontrolled pain   Complete by: As directed    Call MD for:  temperature >100.4   Complete by: As directed    Diet - low sodium heart healthy   Complete by: As directed    Increase activity slowly   Complete by: As directed      Allergies as of 01/07/2020   No Known Allergies     Medication List    STOP taking these medications   azithromycin 250 MG tablet Commonly known as: ZITHROMAX   potassium chloride SA 20 MEQ tablet Commonly known as: KLOR-CON   spironolactone 25 MG tablet Commonly known as: ALDACTONE   triamterene-hydrochlorothiazide 75-50 MG tablet Commonly known as: MAXZIDE     TAKE these medications   albuterol 108 (90 Base) MCG/ACT inhaler Commonly known as: VENTOLIN HFA Inhale 1-2 puffs into  the lungs every 4 (four) hours as needed for wheezing or shortness of breath.   ascorbic acid 500 MG tablet Commonly known as: VITAMIN C Take 1 tablet (500 mg total) by mouth daily. Start taking on: January 08, 2020   aspirin EC 81 MG tablet Take 81 mg by mouth daily. Swallow whole.   budesonide-formoterol 160-4.5 MCG/ACT inhaler Commonly known as: SYMBICORT Inhale 2 puffs into the lungs 2 (two) times daily as needed (for shortness of breath).   cyclobenzaprine 10 MG tablet Commonly known as: FLEXERIL Take 0.5 tablets (5 mg total) by mouth 3 (three) times daily as needed for muscle spasms. What changed: how much to take   escitalopram 20 MG tablet Commonly known as: LEXAPRO Take 20 mg by mouth daily.   ferrous sulfate 325 (65 FE) MG tablet Take 325 mg by mouth daily with breakfast.   Fish Oil 1000 MG Caps Take 1,000 mg by mouth daily.   Flovent HFA 110 MCG/ACT inhaler Generic drug: fluticasone Inhale 2 puffs into the lungs 2 (two) times daily.   gabapentin 600 MG tablet Commonly known as: NEURONTIN Take 600 mg by mouth daily.   Glucosamine 1500 Complex Caps Take 1 each by mouth daily.   ibuprofen 800 MG tablet Commonly known as: ADVIL Take 800 mg by mouth 3 (three) times daily.   labetalol 200 MG tablet Commonly known as: NORMODYNE Take 1 tablet (  200 mg total) by mouth 2 (two) times daily. Please keep upcoming appt with Dr. Caryl Comes in January before anymore refills. Thank you   lisinopril 2.5 MG tablet Commonly known as: ZESTRIL Take 2.5 mg by mouth daily.   loratadine 10 MG tablet Commonly known as: CLARITIN Take 10 mg by mouth daily.   metFORMIN 500 MG tablet Commonly known as: GLUCOPHAGE Take 500 mg by mouth daily.   multivitamin with minerals tablet Take 1 tablet by mouth daily.   multivitamin with minerals Tabs tablet Take 1 tablet by mouth daily.   Narcan 4 MG/0.1ML Liqd nasal spray kit Generic drug: naloxone Place 1 spray into the nose See  admin instructions. As needed for opioid overdose.   omeprazole 40 MG capsule Commonly known as: PRILOSEC Take 40 mg by mouth daily.   oxyCODONE-acetaminophen 5-325 MG tablet Commonly known as: PERCOCET/ROXICET Take 1 tablet by mouth every 6 (six) hours as needed for pain.   Ozempic (1 MG/DOSE) 4 MG/3ML Sopn Generic drug: Semaglutide (1 MG/DOSE) Inject 1 mg into the skin every 7 (seven) days. Thursdays   pravastatin 20 MG tablet Commonly known as: PRAVACHOL Take 20 mg by mouth daily.   predniSONE 10 MG tablet Commonly known as: DELTASONE Take 4 tablets (40 mg total) by mouth daily for 3 days, THEN 3 tablets (30 mg total) daily for 3 days, THEN 2 tablets (20 mg total) daily for 3 days, THEN 1 tablet (10 mg total) daily for 3 days. Start taking on: January 07, 2020   Vitamin D (Ergocalciferol) 1.25 MG (50000 UNIT) Caps capsule Commonly known as: DRISDOL Take 50,000 Units by mouth once a week.   zinc sulfate 220 (50 Zn) MG capsule Take 1 capsule (220 mg total) by mouth daily. Start taking on: January 08, 2020   zolpidem 10 MG tablet Commonly known as: AMBIEN Take 10 mg by mouth at bedtime as needed for sleep.            Durable Medical Equipment  (From admission, onward)         Start     Ordered   01/05/20 0714  For home use only DME Walker  Once       Question:  Patient needs a walker to treat with the following condition  Answer:  Gait disorder   01/05/20 3016          Follow-up Information    [name], NP. Schedule an appointment as soon as possible for a visit in 1 week(s).   Specialty: Nurse Practitioner Contact information: [place] [address] [phone]              No Known Allergies  Consultations:  None   Procedures/Studies: DG CHEST PORT 1 VIEW  Result Date: 01/03/2020 CLINICAL DATA:  Shortness of breath, COVID-19. EXAM: PORTABLE CHEST 1 VIEW COMPARISON:  January 02, 2020. FINDINGS: The heart size and  mediastinal contours are within normal limits. No pneumothorax or pleural effusion is noted. Bilateral lung opacities are noted, right greater than left, consistent with multifocal pneumonia. The visualized skeletal structures are unremarkable. IMPRESSION: Bilateral lung opacities are noted, right greater than left, consistent with multifocal pneumonia. Electronically Signed   By: Marijo Conception M.D.   On: 01/03/2020 11:56   DG Chest Port 1 View  Result Date: 01/02/2020 CLINICAL DATA:  COVID-19 positive, short of breath EXAM: PORTABLE CHEST 1 VIEW COMPARISON:  03/16/2016 FINDINGS: Single frontal view of the chest demonstrates an unremarkable cardiac  silhouette. The thoracic aorta is ectatic. There are scattered bilateral areas of airspace disease, with relative sparing of the left upper lung zone. No effusion or pneumothorax. Postsurgical changes cervical spine. No acute bony abnormalities. IMPRESSION: 1. Bilateral patchy airspace disease consistent with multifocal pneumonia. Pattern is consistent with COVID-19. Electronically Signed   By: Randa Ngo M.D.   On: 01/02/2020 00:30     Subjective: Patient seen and examined bedside, resting comfortably.  "Waiting for my roommate to arrive to pick me up".  No other questions or concerns at this time.  Denies headache, no fever/chills/night sweats, no nausea/vomiting/diarrhea, no chest pain, no palpitations, no shortness of breath, no abdominal pain.  No acute events overnight per nursing staff.  Discharge Exam: Vitals:   01/06/20 2019 01/07/20 0501  BP: 132/86 124/78  Pulse: (!) 58 (!) 54  Resp: 16 18  Temp: 97.9 F (36.6 C) 98 F (36.7 C)  SpO2: 93% 93%   Vitals:   01/06/20 1949 01/06/20 1950 01/06/20 2019 01/07/20 0501  BP:   132/86 124/78  Pulse:   (!) 58 (!) 54  Resp:  17 16 18   Temp:   97.9 F (36.6 C) 98 F (36.7 C)  TempSrc:    Oral  SpO2: 100% 100% 93% 93%  Weight:      Height:        General: Pt is alert, awake, not in  acute distress Cardiovascular: RRR, S1/S2 +, no rubs, no gallops Respiratory: CTA bilaterally, no wheezing, no rhonchi, oxygenating well at rest and with exertion. Abdominal: Soft, NT, ND, bowel sounds + Extremities: no edema, no cyanosis    The results of significant diagnostics from this hospitalization (including imaging, microbiology, ancillary and laboratory) are listed below for reference.     Microbiology: Recent Results (from the past 240 hour(s))  SARS Coronavirus 2 by RT PCR (hospital order, performed in [facility] hospital lab) Nasopharyngeal Nasopharyngeal Swab     Status: Abnormal   Collection Time: 01/01/20 11:58 PM   Specimen: Nasopharyngeal Swab  Result Value Ref Range Status   SARS Coronavirus 2 POSITIVE (A) NEGATIVE Final    Comment: RESULT CALLED TO, READ BACK BY AND VERIFIED WITH: RN B BROOKS AT 0209 01/02/20 CRUICKSHANK A (NOTE) SARS-CoV-2 target nucleic acids are DETECTED  SARS-CoV-2 RNA is generally detectable in upper respiratory specimens  during the acute phase of infection.  Positive results are indicative  of the presence of the identified virus, but do not rule out bacterial infection or co-infection with other pathogens not detected by the test.  Clinical correlation with patient history and  other diagnostic information is necessary to determine patient infection status.  The expected result is negative.  Fact Sheet for Patients:   StrictlyIdeas.no   Fact Sheet for Healthcare Providers:   BankingDealers.co.za    This test is not yet approved or cleared by the [place] FDA and  has been authorized for detection and/or diagnosis of SARS-CoV-2 by FDA under an Emergency Use Authorization (EUA).  This EUA will remain in effect (meaning t his test can be used) for the duration of  the COVID-19 declaration under Section 564(b)(1) of the Act, 21 U.S.C. section 360-bbb-3(b)(1), unless the authorization  is terminated or revoked sooner.  Performed at [facility], [place] [address], [place], [place] [address]   Culture, blood (Routine X 2) w Reflex to ID Panel     Status: None   Collection Time: 01/02/20  6:35 AM   Specimen: BLOOD  Result Value Ref Range Status   Specimen Description   Final    BLOOD LEFT ANTECUBITAL Performed at [place] [address] [place], [place] [address]    Special Requests   Final    BOTTLES DRAWN AEROBIC AND ANAEROBIC Blood Culture results may not be optimal due to an inadequate volume of blood received in culture bottles Performed at [facility] [place] [address], [place], [place] [address]    Culture   Final    NO GROWTH 5 DAYS Performed at [facility], [place] [address] [place], [place] [address]    Report Status 01/07/2020 FINAL  Final  Culture, blood (Routine X 2) w Reflex to ID Panel     Status: None   Collection Time: 01/02/20  7:07 PM   Specimen: BLOOD  Result Value Ref Range Status   Specimen Description   Final    BLOOD RIGHT ANTECUBITAL Performed at [facility] [address], [place], [place] [address]    Special Requests   Final    BOTTLES DRAWN AEROBIC AND ANAEROBIC Blood Culture adequate volume Performed at [facility] [address] [place], [place] [address]    Culture   Final    NO GROWTH 5 DAYS Performed at [facility], [place] [address] [place], [place] [address]    Report Status 01/07/2020 FINAL  Final     Labs: BNP (last 3 results) No results for input(s): BNP in the last 8760 hours. Basic Metabolic Panel: Recent Labs  Lab 01/01/20 2358 01/02/20 0635 01/03/20 0408 01/03/20 1731 01/04/20 0343 01/05/20 0440 01/06/20 0454 01/07/20 0406  NA   < >  --  137  --  138 140 142 137  K   < >  --  3.4*  --  4.3 4.0 4.3 4.4  CL   < >  --  99  --  103 105 105 104  CO2   < >  --  26  --  26 26 24 24   GLUCOSE   <  >  --  144*  --  168* 174* 206* 210*  BUN   < >  --  20  --  23* 18 20 19   CREATININE   < >  --  0.87  --  0.75 0.74 0.85 0.73  CALCIUM   < >  --  8.6*  --  8.4* 8.7* 9.3 8.8*  MG  --  2.0  --  2.2 2.2  --   --   --    < > = values in this interval not displayed.   Liver Function Tests: Recent Labs  Lab 01/03/20 0408 01/04/20 0343 01/05/20 0440 01/06/20 0454 01/07/20 0406  AST 78* 80* 50* 68* 31  ALT 45* 59* 57* 86* 61*  ALKPHOS 66 65 60 68 52  BILITOT 0.4 0.4 0.4 0.5 0.3  PROT 7.1 7.0 6.5 7.0 6.2*  ALBUMIN 3.2* 3.0* 2.8* 3.4* 2.8*   No results for input(s): LIPASE, AMYLASE in the last 168 hours. No results for input(s): AMMONIA in the last 168 hours. CBC: Recent Labs  Lab 01/03/20 0408 01/04/20 0343 01/05/20 0440 01/06/20 0454 01/07/20 0406  WBC 5.2 6.4 9.4 11.1* 10.5  NEUTROABS 4.1 5.4 8.1* 9.3* 8.5*  HGB 12.5 12.7 13.1 14.3 13.5  HCT 40.0 40.1 41.2 44.6 43.1  MCV 90.7 90.3 90.0 89.2 91.3  PLT 176 210 273 361 339   Cardiac Enzymes: No  results for input(s): CKTOTAL, CKMB, CKMBINDEX, TROPONINI in the last 168 hours. BNP: Invalid input(s): POCBNP CBG: Recent Labs  Lab 01/06/20 0739 01/06/20 1104 01/06/20 1642 01/06/20 2021 01/07/20 0749  GLUCAP 204* 142* 184* 238* 157*   D-Dimer Recent Labs    01/06/20 0454 01/07/20 0406  DDIMER 0.49 0.49   Hgb A1c No results for input(s): HGBA1C in the last 72 hours. Lipid Profile No results for input(s): CHOL, HDL, LDLCALC, TRIG, CHOLHDL, LDLDIRECT in the last 72 hours. Thyroid function studies No results for input(s): TSH, T4TOTAL, T3FREE, THYROIDAB in the last 72 hours.  Invalid input(s): FREET3 Anemia work up No results for input(s): VITAMINB12, FOLATE, FERRITIN, TIBC, IRON, RETICCTPCT in the last 72 hours. Urinalysis    Component Value Date/Time   COLORURINE YELLOW 04/04/2016 1813   APPEARANCEUR CLEAR 04/04/2016 1813   APPEARANCEUR Hazy 03/09/2014 1217   LABSPEC 1.020 04/04/2016 1813   LABSPEC 1.030  03/09/2014 1217   PHURINE 6.5 04/04/2016 1813   GLUCOSEU NEGATIVE 04/04/2016 1813   GLUCOSEU Negative 03/09/2014 1217   HGBUR NEGATIVE 04/04/2016 1813   BILIRUBINUR NEGATIVE 04/04/2016 1813   BILIRUBINUR 1 06/11/2015 1248   BILIRUBINUR Negative 03/09/2014 1217   KETONESUR NEGATIVE 04/04/2016 1813   PROTEINUR NEGATIVE 04/04/2016 1813   UROBILINOGEN 0.2 06/11/2015 1248   NITRITE NEGATIVE 04/04/2016 1813   LEUKOCYTESUR NEGATIVE 04/04/2016 1813   LEUKOCYTESUR Negative 03/09/2014 1217   Sepsis Labs Invalid input(s): PROCALCITONIN,  WBC,  LACTICIDVEN Microbiology Recent Results (from the past 240 hour(s))  SARS Coronavirus 2 by RT PCR (hospital order, performed in [facility] hospital lab) Nasopharyngeal Nasopharyngeal Swab     Status: Abnormal   Collection Time: 01/01/20 11:58 PM   Specimen: Nasopharyngeal Swab  Result Value Ref Range Status   SARS Coronavirus 2 POSITIVE (A) NEGATIVE Final    Comment: RESULT CALLED TO, READ BACK BY AND VERIFIED WITH: RN B BROOKS AT 0209 01/02/20 CRUICKSHANK A (NOTE) SARS-CoV-2 target nucleic acids are DETECTED  SARS-CoV-2 RNA is generally detectable in upper respiratory specimens  during the acute phase of infection.  Positive results are indicative  of the presence of the identified virus, but do not rule out bacterial infection or co-infection with other pathogens not detected by the test.  Clinical correlation with patient history and  other diagnostic information is necessary to determine patient infection status.  The expected result is negative.  Fact Sheet for Patients:   StrictlyIdeas.no   Fact Sheet for Healthcare Providers:   BankingDealers.co.za    This test is not yet approved or cleared by the [place] FDA and  has been authorized for detection and/or diagnosis of SARS-CoV-2 by FDA under an Emergency Use Authorization (EUA).  This EUA will remain in effect (meaning t his test can  be used) for the duration of  the COVID-19 declaration under Section 564(b)(1) of the Act, 21 U.S.C. section 360-bbb-3(b)(1), unless the authorization is terminated or revoked sooner.  Performed at [facility], [place] [address], [place], [place] [address]   Culture, blood (Routine X 2) w Reflex to ID Panel     Status: None   Collection Time: 01/02/20  6:35 AM   Specimen: BLOOD  Result Value Ref Range Status   Specimen Description   Final    BLOOD LEFT ANTECUBITAL Performed at [facility] [address], [place], [place] [address]    Special Requests   Final    BOTTLES DRAWN AEROBIC AND ANAEROBIC Blood Culture results may not be  optimal due to an inadequate volume of blood received in culture bottles Performed at [facility] [address] [place], [place] [address]    Culture   Final    NO GROWTH 5 DAYS Performed at [facility], [place] [address], [place], [place] [address]    Report Status 01/07/2020 FINAL  Final  Culture, blood (Routine X 2) w Reflex to ID Panel     Status: None   Collection Time: 01/02/20  7:07 PM   Specimen: BLOOD  Result Value Ref Range Status   Specimen Description   Final    BLOOD RIGHT ANTECUBITAL Performed at [facility] [address], [place], [place] [address]    Special Requests   Final    BOTTLES DRAWN AEROBIC AND ANAEROBIC Blood Culture adequate volume Performed at [facility] [address] [place], [place] [address]    Culture   Final    NO GROWTH 5 DAYS Performed at [facility], [place] [address], [place], [place] [address]    Report Status 01/07/2020 FINAL  Final     Time coordinating discharge: Over 30 minutes  SIGNED:   Annjanette Wertenberger J [place] DO  [facility] 01/07/2020, 10:58 AM

## 2020-01-22 NOTE — Progress Notes (Deleted)
NEUROLOGY CONSULTATION NOTE  [name] MRN: [id number] DOB: 01-20-1966  Referring provider: Daiva Eves, MD Primary care provider: Charlott Holler, NP  Reason for consult:  Paresthesias in both feet  HISTORY OF PRESENT ILLNESS: [name] is a 54 year old ***-handed female who presents for paresthesias in both feet.  History supplemented by referring provider's note.  She began experiencing numbness and tingling in the toes of both feet in February.  No associated weakness.  No involvement of upper extremities.  She has some chronic back pain with history of lumbar fusion in 2006, but denies radicular pain down the legs.  ***.  Labs from that time included TSH 0.870, Hgb A1c 6.0 and B12 814.    PAST MEDICAL HISTORY: Past Medical History:  Diagnosis Date   Anemia    Anxiety    Arthritis    osteoarthritis   Atypical chest pain    negative Myoview in 2011 with no ischemi and normal LV function.    Benign neoplasm of colon    Depression    DVT (deep venous thrombosis) (HCC)    Dyspnea    GERD (gastroesophageal reflux disease)    uses Nexium   HTN (hypertension)    Long Q-T syndrome    Obesity    OSA (obstructive sleep apnea)    uses C-PAP   PE (pulmonary embolism)    Peripheral vascular disease (HCC)    blood clot in right leg   Prolonged QT interval    gene positive   Seizures (HCC)    hasn't had any for 25 years    PAST SURGICAL HISTORY: Past Surgical History:  Procedure Laterality Date   ANTERIOR CERVICAL DECOMP/DISCECTOMY FUSION N/A 06/25/2013   Procedure: ANTERIOR CERVICAL DECOMPRESSION/DISCECTOMY FUSION 2 LEVELS LEVELS C FOUR/FIVE, SIX/SEVEN;  Surgeon: Floyce Stakes, MD;  Location: [facility];  Service: Neurosurgery;  Laterality: N/A;   ANTERIOR CERVICAL DECOMP/DISCECTOMY FUSION N/A 03/29/2016   Procedure: CERVICAL THREE-FOUR, CERVICAL FOUR-FIVE ANTERIOR CERVICAL DECOMPRESSION/DISCECTOMY/FUSION WITH REMOVAL OF PLATES AT I6-8  E3-2;  Surgeon: Leeroy Cha, MD;  Location: [place];  Service: Neurosurgery;  Laterality: N/A;  C3-4 C5-6 ANTERIOR CERVICAL DECOMPRESSION/DISCECTOMY/FUSION WITH POSSIBLE REMOVAL OF PLATE AT Z2-2 Q8-2   BACK SURGERY     CARDIAC CATHETERIZATION     COLONOSCOPY     COLONOSCOPY WITH PROPOFOL N/A 01/16/2018   Procedure: COLONOSCOPY WITH PROPOFOL;  Surgeon: Lollie Sails, MD;  Location: [facility] ENDOSCOPY;  Service: Endoscopy;  Laterality: N/A;   DILATION AND CURETTAGE OF UTERUS     ESOPHAGOGASTRODUODENOSCOPY (EGD) WITH PROPOFOL N/A 11/17/2015   Procedure: ESOPHAGOGASTRODUODENOSCOPY (EGD) WITH PROPOFOL;  Surgeon: Lollie Sails, MD;  Location: [facility] ENDOSCOPY;  Service: Endoscopy;  Laterality: N/A;   ESOPHAGOGASTRODUODENOSCOPY (EGD) WITH PROPOFOL N/A 01/16/2018   Procedure: ESOPHAGOGASTRODUODENOSCOPY (EGD) WITH PROPOFOL;  Surgeon: Lollie Sails, MD;  Location: [facility] ENDOSCOPY;  Service: Endoscopy;  Laterality: N/A;   JOINT REPLACEMENT  04/2006   hip   left foot surgery     left hand surgery x 2     LUMBAR DISC SURGERY     right hand surgery for torn ligament     TOTAL HIP ARTHROPLASTY      MEDICATIONS: Current Outpatient Medications on File Prior to Visit  Medication Sig Dispense Refill   albuterol (PROVENTIL HFA;VENTOLIN HFA) 108 (90 BASE) MCG/ACT inhaler Inhale 1-2 puffs into the lungs every 4 (four) hours as needed for wheezing or shortness of breath.     ascorbic acid (VITAMIN C)  500 MG tablet Take 1 tablet (500 mg total) by mouth daily. 30 tablet 0   aspirin EC 81 MG tablet Take 81 mg by mouth daily. Swallow whole.     budesonide-formoterol (SYMBICORT) 160-4.5 MCG/ACT inhaler Inhale 2 puffs into the lungs 2 (two) times daily as needed (for shortness of breath).     cyclobenzaprine (FLEXERIL) 10 MG tablet Take 0.5 tablets (5 mg total) by mouth 3 (three) times daily as needed for muscle spasms. (Patient taking differently: Take 10 mg by mouth 3 (three) times daily as  needed for muscle spasms. ) 20 tablet 0   escitalopram (LEXAPRO) 20 MG tablet Take 20 mg by mouth daily.      ferrous sulfate 325 (65 FE) MG tablet Take 325 mg by mouth daily with breakfast.     FLOVENT HFA 110 MCG/ACT inhaler Inhale 2 puffs into the lungs 2 (two) times daily.     gabapentin (NEURONTIN) 600 MG tablet Take 600 mg by mouth daily.      Glucosamine-Chondroit-Vit C-Mn (GLUCOSAMINE 1500 COMPLEX) CAPS Take 1 each by mouth daily.     ibuprofen (ADVIL) 800 MG tablet Take 800 mg by mouth 3 (three) times daily.     labetalol (NORMODYNE) 200 MG tablet Take 1 tablet (200 mg total) by mouth 2 (two) times daily. Please keep upcoming appt with Dr. Caryl Comes in January before anymore refills. Thank you 180 tablet 0   lisinopril (ZESTRIL) 2.5 MG tablet Take 2.5 mg by mouth daily.     loratadine (CLARITIN) 10 MG tablet Take 10 mg by mouth daily.     metFORMIN (GLUCOPHAGE) 500 MG tablet Take 500 mg by mouth daily.     Multiple Vitamin (MULTIVITAMIN WITH MINERALS) TABS tablet Take 1 tablet by mouth daily.     Multiple Vitamins-Minerals (MULTIVITAMIN WITH MINERALS) tablet Take 1 tablet by mouth daily.     NARCAN 4 MG/0.1ML LIQD nasal spray kit Place 1 spray into the nose See admin instructions. As needed for opioid overdose.     Omega-3 Fatty Acids (FISH OIL) 1000 MG CAPS Take 1,000 mg by mouth daily.      omeprazole (PRILOSEC) 40 MG capsule Take 40 mg by mouth daily.     oxyCODONE-acetaminophen (PERCOCET/ROXICET) 5-325 MG tablet Take 1 tablet by mouth every 6 (six) hours as needed for pain.     OZEMPIC, 1 MG/DOSE, 4 MG/3ML SOPN Inject 1 mg into the skin every 7 (seven) days. Thursdays     pravastatin (PRAVACHOL) 20 MG tablet Take 20 mg by mouth daily.     Vitamin D, Ergocalciferol, (DRISDOL) 1.25 MG (50000 UNIT) CAPS capsule Take 50,000 Units by mouth once a week.     zinc sulfate 220 (50 Zn) MG capsule Take 1 capsule (220 mg total) by mouth daily. 30 capsule 0   zolpidem (AMBIEN)  10 MG tablet Take 10 mg by mouth at bedtime as needed for sleep.     No current facility-administered medications on file prior to visit.    ALLERGIES: No Known Allergies  FAMILY HISTORY: Family History  Problem Relation Age of Onset   Long QT syndrome Mother    CAD Mother    Diabetes Father    CAD Father    Diabetes Brother    Breast cancer Maternal Aunt 24   Breast cancer Paternal Aunt    Breast cancer Maternal Aunt 29   ***.  SOCIAL HISTORY: Social History   Socioeconomic History   Marital status: Single    Spouse  name: Not on file   Number of children: Not on file   Years of education: Not on file   Highest education level: Not on file  Occupational History   Occupation: disabled  Tobacco Use   Smoking status: Former Smoker    Quit date: 08/16/2001    Years since quitting: 18.4   Smokeless tobacco: Never Used  Vaping Use   Vaping Use: Never used  Substance and Sexual Activity   Alcohol use: Yes    Alcohol/week: 2.0 standard drinks    Types: 2 Standard drinks or equivalent per week    Comment: very rare   Drug use: Not Currently    Comment: cocaine remission abstinent since 01/2008   Sexual activity: Never  Other Topics Concern   Not on file  Social History Narrative   Not on file   Social Determinants of Health   Financial Resource Strain:    Difficulty of Paying Living Expenses: Not on file  Food Insecurity:    Worried About Charity fundraiser in the Last Year: Not on file   [employer] of Food in the Last Year: Not on file  Transportation Needs:    Lack of Transportation (Medical): Not on file   Lack of Transportation (Non-Medical): Not on file  Physical Activity:    Days of Exercise per Week: Not on file   Minutes of Exercise per Session: Not on file  Stress:    Feeling of Stress : Not on file  Social Connections:    Frequency of Communication with Friends and Family: Not on file   Frequency of Social Gatherings  with Friends and Family: Not on file   Attends Religious Services: Not on file   Active Member of [place] or Organizations: Not on file   Attends Archivist Meetings: Not on file   Marital Status: Not on file  Intimate Partner Violence:    Fear of Current or Ex-Partner: Not on file   Emotionally Abused: Not on file   Physically Abused: Not on file   Sexually Abused: Not on file    REVIEW OF SYSTEMS: Constitutional: No fevers, chills, or sweats, no generalized fatigue, change in appetite Eyes: No visual changes, double vision, eye pain Ear, nose and throat: No hearing loss, ear pain, nasal congestion, sore throat Cardiovascular: No chest pain, palpitations Respiratory:  No shortness of breath at rest or with exertion, wheezes GastrointestinaI: No nausea, vomiting, diarrhea, abdominal pain, fecal incontinence Genitourinary:  No dysuria, urinary retention or frequency Musculoskeletal:  No neck pain, back pain Integumentary: No rash, pruritus, skin lesions Neurological: as above Psychiatric: No depression, insomnia, anxiety Endocrine: No palpitations, fatigue, diaphoresis, mood swings, change in appetite, change in weight, increased thirst Hematologic/Lymphatic:  No purpura, petechiae. Allergic/Immunologic: no itchy/runny eyes, nasal congestion, recent allergic reactions, rashes  PHYSICAL EXAM: *** General: No acute distress.  Patient appears ***-groomed.  *** Head:  Normocephalic/atraumatic Eyes:  fundi examined but not visualized Neck: supple, no paraspinal tenderness, full range of motion Back: No paraspinal tenderness Heart: regular rate and rhythm Lungs: Clear to auscultation bilaterally. Vascular: No carotid bruits. Neurological Exam: Mental status: alert and oriented to person, place, and time, recent and remote memory intact, fund of knowledge intact, attention and concentration intact, speech fluent and not dysarthric, language intact. Cranial nerves: CN  I: not tested CN II: pupils equal, round and reactive to light, visual fields intact CN III, IV, VI:  full range of motion, no nystagmus, no ptosis CN V: facial  sensation intact CN VII: upper and lower face symmetric CN VIII: hearing intact CN IX, X: gag intact, uvula midline CN XI: sternocleidomastoid and trapezius muscles intact CN XII: tongue midline Bulk & Tone: normal, no fasciculations. Motor:  5/5 throughout *** Sensation:  Pinprick *** temperature *** and vibration sensation intact.  ***. Deep Tendon Reflexes:  2+ throughout, *** toes downgoing.  *** Finger to nose testing:  Without dysmetria.  *** Heel to shin:  Without dysmetria.  *** Gait:  Normal station and stride.  Able to turn and tandem walk. [place] ***.  IMPRESSION: ***  PLAN: ***  Thank you for allowing me to take part in the care of this patient.  Metta Clines, DO  CC: ***

## 2020-01-24 ENCOUNTER — Ambulatory Visit: Payer: Medicare HMO | Admitting: Neurology

## 2020-05-26 ENCOUNTER — Other Ambulatory Visit: Payer: Self-pay | Admitting: Orthopedic Surgery

## 2020-05-26 DIAGNOSIS — M546 Pain in thoracic spine: Secondary | ICD-10-CM

## 2020-05-26 DIAGNOSIS — M542 Cervicalgia: Secondary | ICD-10-CM

## 2020-05-28 ENCOUNTER — Other Ambulatory Visit: Payer: Self-pay | Admitting: Orthopedic Surgery

## 2020-05-28 DIAGNOSIS — M25562 Pain in left knee: Secondary | ICD-10-CM

## 2020-05-28 DIAGNOSIS — M25561 Pain in right knee: Secondary | ICD-10-CM

## 2020-06-03 ENCOUNTER — Other Ambulatory Visit: Payer: Self-pay

## 2020-06-03 ENCOUNTER — Emergency Department (HOSPITAL_COMMUNITY)
Admission: EM | Admit: 2020-06-03 | Discharge: 2020-06-04 | Disposition: A | Discharge status: Home / Self Care | Payer: Medicare HMO | Attending: Emergency Medicine | Admitting: Emergency Medicine

## 2020-06-03 ENCOUNTER — Encounter (HOSPITAL_COMMUNITY): Payer: Self-pay

## 2020-06-03 ENCOUNTER — Emergency Department (HOSPITAL_COMMUNITY): Payer: Medicare HMO

## 2020-06-03 DIAGNOSIS — S0081XA Abrasion of other part of head, initial encounter: Secondary | ICD-10-CM | POA: Insufficient documentation

## 2020-06-03 DIAGNOSIS — Z23 Encounter for immunization: Secondary | ICD-10-CM | POA: Diagnosis not present

## 2020-06-03 DIAGNOSIS — S4992XA Unspecified injury of left shoulder and upper arm, initial encounter: Secondary | ICD-10-CM | POA: Insufficient documentation

## 2020-06-03 DIAGNOSIS — Z87891 Personal history of nicotine dependence: Secondary | ICD-10-CM | POA: Diagnosis not present

## 2020-06-03 DIAGNOSIS — S022XXA Fracture of nasal bones, initial encounter for closed fracture: Secondary | ICD-10-CM

## 2020-06-03 DIAGNOSIS — I10 Essential (primary) hypertension: Secondary | ICD-10-CM | POA: Insufficient documentation

## 2020-06-03 DIAGNOSIS — S0993XA Unspecified injury of face, initial encounter: Secondary | ICD-10-CM | POA: Diagnosis present

## 2020-06-03 DIAGNOSIS — Z86711 Personal history of pulmonary embolism: Secondary | ICD-10-CM | POA: Diagnosis not present

## 2020-06-03 DIAGNOSIS — Z79899 Other long term (current) drug therapy: Secondary | ICD-10-CM | POA: Diagnosis not present

## 2020-06-03 DIAGNOSIS — S46812A Strain of other muscles, fascia and tendons at shoulder and upper arm level, left arm, initial encounter: Secondary | ICD-10-CM

## 2020-06-03 DIAGNOSIS — Y9389 Activity, other specified: Secondary | ICD-10-CM | POA: Insufficient documentation

## 2020-06-03 DIAGNOSIS — Y999 Unspecified external cause status: Secondary | ICD-10-CM | POA: Insufficient documentation

## 2020-06-03 DIAGNOSIS — Z86718 Personal history of other venous thrombosis and embolism: Secondary | ICD-10-CM | POA: Insufficient documentation

## 2020-06-03 DIAGNOSIS — Y92008 Other place in unspecified non-institutional (private) residence as the place of occurrence of the external cause: Secondary | ICD-10-CM | POA: Insufficient documentation

## 2020-06-03 DIAGNOSIS — S29012A Strain of muscle and tendon of back wall of thorax, initial encounter: Secondary | ICD-10-CM | POA: Insufficient documentation
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
## 2020-06-04 ENCOUNTER — Emergency Department (HOSPITAL_COMMUNITY): Payer: Medicare HMO

## 2020-06-04 DIAGNOSIS — S022XXA Fracture of nasal bones, initial encounter for closed fracture: Secondary | ICD-10-CM | POA: Diagnosis not present

## 2020-06-04 MED ORDER — METHOCARBAMOL 500 MG PO TABS: 500.0000 mg | ORAL_TABLET | Freq: Three times a day (TID) | ORAL | 0 refills | Status: DC | PRN

## 2020-06-04 MED ORDER — METHOCARBAMOL 500 MG PO TABS
500.0000 mg | ORAL_TABLET | Freq: Once | ORAL | Status: AC
  Administered 2020-06-04: 500 mg via ORAL
  Filled 2020-06-04: qty 1

## 2020-06-04 MED ORDER — NAPROXEN 250 MG PO TABS
500.0000 mg | ORAL_TABLET | Freq: Once | ORAL | Status: AC
  Administered 2020-06-04: 500 mg via ORAL
  Filled 2020-06-04: qty 2

## 2020-06-04 MED ORDER — TETANUS-DIPHTH-ACELL PERTUSSIS 5-2.5-18.5 LF-MCG/0.5 IM SUSY
0.5000 mL | PREFILLED_SYRINGE | Freq: Once | INTRAMUSCULAR | Status: AC
  Administered 2020-06-04: 0.5 mL via INTRAMUSCULAR
  Filled 2020-06-04: qty 0.5

## 2020-06-04 NOTE — ED Notes (Signed)
E-signature pad unavailable at time of pt discharge. This RN discussed discharge materials with pt and answered all pt questions. Pt stated understanding of discharge material. ? ?

## 2020-06-04 NOTE — Discharge Instructions (Signed)
Your work-up today was notable for a fracture to your nasal bones.  You have been given a referral to ENT to ensure proper healing of your broken bones.  Call in the morning to schedule a follow-up visit.  Apply ice to areas of pain/injury to limit swelling/inflammation.  We recommend ibuprofen or Aleve for management of pain.  You have been prescribed Robaxin for management of muscle spasms.  Do not drive or drink alcohol after taking this medication as it may make you drowsy and impair your judgment.  Return to the emergency department for new or concerning symptoms.
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
## 2020-06-14 ENCOUNTER — Other Ambulatory Visit: Payer: Medicare HMO

## 2020-06-14 ENCOUNTER — Inpatient Hospital Stay: Admission: RE | Admit: 2020-06-14 | Payer: Medicare HMO | Source: Ambulatory Visit

## 2020-06-15 ENCOUNTER — Other Ambulatory Visit: Payer: Medicare HMO

## 2020-06-15 ENCOUNTER — Inpatient Hospital Stay: Admission: RE | Admit: 2020-06-15 | Payer: Medicare HMO | Source: Ambulatory Visit

## 2020-06-24 ENCOUNTER — Encounter: Payer: Self-pay | Admitting: Critical Care Medicine

## 2020-06-24 ENCOUNTER — Other Ambulatory Visit: Payer: Self-pay | Admitting: Critical Care Medicine

## 2020-06-24 MED ORDER — ACCU-CHEK SOFTCLIX LANCETS MISC: 12 refills | Status: AC

## 2020-06-24 MED ORDER — CYCLOBENZAPRINE HCL 10 MG PO TABS: 20.0000 mg | ORAL_TABLET | Freq: Every day | ORAL | 0 refills | Status: DC

## 2020-06-24 MED ORDER — LABETALOL HCL 200 MG PO TABS: 200.0000 mg | ORAL_TABLET | Freq: Two times a day (BID) | ORAL | 0 refills | Status: DC

## 2020-06-24 MED ORDER — POTASSIUM CHLORIDE CRYS ER 20 MEQ PO TBCR: EXTENDED_RELEASE_TABLET | ORAL | 1 refills | Status: AC

## 2020-06-24 MED ORDER — GABAPENTIN 600 MG PO TABS: 600.0000 mg | ORAL_TABLET | Freq: Three times a day (TID) | ORAL | 1 refills | Status: AC
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
## 2020-06-29 ENCOUNTER — Encounter: Payer: Self-pay | Admitting: *Deleted
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
## 2020-07-01 ENCOUNTER — Encounter: Payer: Self-pay | Admitting: *Deleted

## 2020-07-01 ENCOUNTER — Encounter: Payer: Self-pay | Admitting: Physician Assistant

## 2020-07-01 NOTE — Congregational Nurse Program (Unsigned)
  Dept: [facility] Nurse Program Note  Date of Encounter: 07/01/2020  Past Medical History: Past Medical History:  Diagnosis Date  . Anemia   . Anxiety   . Arthritis    osteoarthritis  . Atypical chest pain    negative Myoview in 2011 with no ischemi and normal LV function.   . Benign neoplasm of colon   . Depression   . DVT (deep venous thrombosis) ([place])   . Dyspnea   . GERD (gastroesophageal reflux disease)    uses Nexium  . HTN (hypertension)   . Long Q-T syndrome   . Obesity   . OSA (obstructive sleep apnea)    uses C-PAP  . PE (pulmonary embolism)   . Peripheral vascular disease ([place])    blood clot in right leg  . Prolonged QT interval    gene positive  . Seizures ([place])    hasn't had any for 25 years    Encounter Details: cbg 133 3/16  cbg 135, c/o L hip pain, wants a shot in it, gave 2 more otc patches w/ lidocaine. She will see dr Joya Gaskins this pm

## 2020-07-01 NOTE — Progress Notes (Signed)
Pt has not gotten her medications yet, she has not been able to go get them.  She still has some meds, has been taking them.  Her pill organizer is very beat up, was given a new one.   She is taking the gabapentin at 2 tabs 3 x day. Was given instructions to take 1 tab at a time, tid  She is taking the relafen at 2 tabs bid, max dose is 2000 mg qd Requested she take 1 tab bid.   Taking flexeril 2 tabs at bedtime.  Having some upper L chest pain, +chest wall tenderness.   She kept the appt w/ Ortho, has MRI scheduled.   Still having significant MS pain, that is why she is taking extra rx.  Not checking blood sugars, does not have lancets. CBG 133  Instructions: as per individual meds as above. Needs to bring all med bottles and glucose equipment to [facility] on Thursday.  Rosaria Ferries, PA-C 07/01/2020 3:55 PM

## 2020-07-06 ENCOUNTER — Other Ambulatory Visit: Payer: Self-pay | Admitting: *Deleted

## 2020-07-06 ENCOUNTER — Encounter: Payer: Self-pay | Admitting: *Deleted

## 2020-07-06 MED ORDER — LABETALOL HCL 200 MG PO TABS: 200.0000 mg | ORAL_TABLET | Freq: Two times a day (BID) | ORAL | 0 refills | Status: DC

## 2020-07-06 MED ORDER — ESCITALOPRAM OXALATE 20 MG PO TABS: 20.0000 mg | ORAL_TABLET | Freq: Every day | ORAL | 0 refills | Status: AC

## 2020-07-06 NOTE — Congregational Nurse Program (Signed)
  Dept: [facility] Nurse Program Note  Date of Encounter: 07/06/2020  Past Medical History: Past Medical History:  Diagnosis Date  . Anemia   . Anxiety   . Arthritis    osteoarthritis  . Atypical chest pain    negative Myoview in 2011 with no ischemi and normal LV function.   . Benign neoplasm of colon   . Depression   . DVT (deep venous thrombosis) ([place])   . Dyspnea   . GERD (gastroesophageal reflux disease)    uses Nexium  . HTN (hypertension)   . Long Q-T syndrome   . Obesity   . OSA (obstructive sleep apnea)    uses C-PAP  . PE (pulmonary embolism)   . Peripheral vascular disease ([place])    blood clot in right leg  . Prolonged QT interval    gene positive  . Seizures ([place])    hasn't had any for 25 years    Encounter Details:met with pt, filled pillbox. Talked about when pt should take meds . [facility], [facility] acct with pt present so she can have delivery. Went over her meter and had her check cbg while with nurse. cbg 134. 2 hours after last meal.

## 2020-07-07 ENCOUNTER — Other Ambulatory Visit: Payer: Self-pay | Admitting: *Deleted

## 2020-07-07 MED ORDER — LORATADINE 10 MG PO TABS: 10.0000 mg | ORAL_TABLET | Freq: Every day | ORAL | 3 refills | Status: AC

## 2020-07-07 MED ORDER — ADULT MULTIVITAMIN W/MINERALS CH: 1.0000 | ORAL_TABLET | Freq: Every day | ORAL | 3 refills | Status: AC

## 2020-07-14 ENCOUNTER — Other Ambulatory Visit: Payer: Self-pay

## 2020-07-14 ENCOUNTER — Ambulatory Visit
Admission: RE | Admit: 2020-07-14 | Discharge: 2020-07-14 | Disposition: A | Discharge status: Home / Self Care | Payer: Medicare HMO | Source: Ambulatory Visit | Attending: Orthopedic Surgery | Admitting: Orthopedic Surgery

## 2020-07-14 DIAGNOSIS — M546 Pain in thoracic spine: Secondary | ICD-10-CM

## 2020-07-14 DIAGNOSIS — M25561 Pain in right knee: Secondary | ICD-10-CM

## 2020-07-14 DIAGNOSIS — M25562 Pain in left knee: Secondary | ICD-10-CM

## 2020-07-15 ENCOUNTER — Encounter: Payer: Self-pay | Admitting: Critical Care Medicine

## 2020-07-15 ENCOUNTER — Other Ambulatory Visit: Payer: Self-pay | Admitting: Critical Care Medicine

## 2020-07-15 DIAGNOSIS — M1612 Unilateral primary osteoarthritis, left hip: Secondary | ICD-10-CM

## 2020-07-15 NOTE — Progress Notes (Signed)
Is a 55 year old female seen in the [facility] clinic complains of left hip pain she has previously had a right hip total replacement.  She does have a pending appointment with me in the health and wellness clinic in the next few weeks.  Patient also complains of pain in the left knee.  On exam there is tenderness in the left knee decreased range of motion  Plan for this patient is to establish in our clinic for primary care and then will refer this patient to orthopedics for further care we did give the patient supply of over-the-counter ibuprofen and Tylenol for pain control  Patient does have insurance and I made a referral for this patient to orthopedics

## 2020-07-17 ENCOUNTER — Ambulatory Visit
Admission: RE | Admit: 2020-07-17 | Discharge: 2020-07-17 | Disposition: A | Discharge status: Home / Self Care | Payer: Medicare HMO | Source: Ambulatory Visit | Attending: Orthopedic Surgery | Admitting: Orthopedic Surgery

## 2020-07-17 DIAGNOSIS — M542 Cervicalgia: Secondary | ICD-10-CM

## 2020-07-19 NOTE — Progress Notes (Deleted)
   Subjective:    Patient ID: [name], female    DOB: August 12, 1965, 55 y.o.   MRN: [id number]  Is a 54 year old female seen in the [facility] clinic complains of left hip pain she has previously had a right hip total replacement.  She does have a pending appointment with me in the health and wellness clinic in the next few weeks.  Patient also complains of pain in the left knee.  On exam there is tenderness in the left knee decreased range of motion  Plan for this patient is to establish in our clinic for primary care and then will refer this patient to orthopedics for further care we did give the patient supply of over-the-counter ibuprofen and Tylenol for pain control  Patient does have insurance and I made a referral for this patient to orthopedics      Review of Systems     Objective:   Physical Exam        Assessment & Plan:

## 2020-07-20 ENCOUNTER — Ambulatory Visit: Payer: Medicare HMO | Admitting: Critical Care Medicine

## 2020-07-22 ENCOUNTER — Encounter: Payer: Self-pay | Admitting: *Deleted

## 2020-07-22 ENCOUNTER — Encounter: Payer: Self-pay | Admitting: Critical Care Medicine

## 2020-07-22 NOTE — Progress Notes (Signed)
This is a 55 year old female with history of hypertension pulmonary embolism long QT syndrome reflux sleep apnea prior COVID-19 pneumonia  Patient seen in the [facility] clinic she missed her previous appointment at health and wellness with me.  We reestablished another visit on April 6 at 8:30 AM.  She needs a CPAP machine she need a repeat sleep study  On exam blood pressure 130/84 pulse 80 saturation 95%  There is an elongated toenail on the right fifth toe this was trimmed  No needs were otherwise identified  Patient has upcoming appointment at the health and wellness clinic she will keep this

## 2020-07-22 NOTE — Congregational Nurse Program (Signed)
  Dept: [facility] Nurse Program Note  Date of Encounter: 07/22/2020  Past Medical History: Past Medical History:  Diagnosis Date  . Anemia   . Anxiety   . Arthritis    osteoarthritis  . Atypical chest pain    negative Myoview in 2011 with no ischemi and normal LV function.   . Benign neoplasm of colon   . Depression   . DVT (deep venous thrombosis) ([place])   . Dyspnea   . GERD (gastroesophageal reflux disease)    uses Nexium  . HTN (hypertension)   . Long Q-T syndrome   . Obesity   . OSA (obstructive sleep apnea)    uses C-PAP  . PE (pulmonary embolism)   . Peripheral vascular disease ([place])    blood clot in right leg  . Prolonged QT interval    gene positive  . Seizures ([place])    hasn't had any for 25 years    Encounter Details:[facility] 3/22 and reminded that pt owes 40.50, saw pt this am and she states she will pay them Friday. Will call rowdy today and put him in touch w/ pt.

## 2020-07-24 ENCOUNTER — Ambulatory Visit: Payer: Medicare HMO | Admitting: Orthopaedic Surgery

## 2020-08-05 ENCOUNTER — Ambulatory Visit: Payer: Medicare HMO | Admitting: Critical Care Medicine

## 2020-08-07 ENCOUNTER — Ambulatory Visit: Payer: Medicare HMO | Admitting: Podiatry

## 2020-08-14 ENCOUNTER — Ambulatory Visit: Payer: Medicare HMO | Admitting: Student

## 2020-08-21 ENCOUNTER — Ambulatory Visit: Payer: Medicare HMO | Admitting: Podiatry

## 2020-08-25 ENCOUNTER — Other Ambulatory Visit: Payer: Self-pay | Admitting: Orthopedic Surgery

## 2020-08-28 ENCOUNTER — Ambulatory Visit: Payer: Medicare HMO | Admitting: Podiatry

## 2020-09-15 ENCOUNTER — Telehealth: Payer: Self-pay | Admitting: *Deleted

## 2020-09-15 NOTE — Telephone Encounter (Signed)
   [facility] Pre-operative Risk Assessment    Patient Name: [name]  DOB: [birth year]  MRN: [id number]   HEARTCARE STAFF: - Please ensure there is not already an duplicate clearance open for this procedure. - Under Visit Info/Reason for Call, type in Other and utilize the format Clearance MM/DD/YY or Clearance TBD. Do not use dashes or single digits. - If request is for dental extraction, please clarify the # of teeth to be extracted.  Request for surgical clearance: URGENT PER DR. DUMONSKI  1. What type of surgery is being performed? POSTERIOR CERVICAL DECOMPRESSION FUSION, C3-C7 W.INSTRUMENTATION AND ALLOGRAFT   2. When is this surgery scheduled? 09/24/20 URGENT   3. What type of clearance is required (medical clearance vs. Pharmacy clearance to hold med vs. Both)? MEDICAL  4. Are there any medications that need to be held prior to surgery and how long? ASA    5. Practice name and name of physician performing surgery? [facility]; DR. Cambridge   6. What is the office phone number? [phone]   7.   What is the office fax number? [place]  8.   Anesthesia type (None, local, MAC, general) ? NOT LISTED (GENERAL?)   Julaine Hua 09/15/2020, 2:16 PM  _________________________________________________________________   (provider comments below)

## 2020-09-15 NOTE — Telephone Encounter (Signed)
S/w the pt who is agreeable to plan of care for pre op appt. Surgery scheduled for 09/24/20. I will forward clearance notes to Chanetta Marshall, NP for upcoming appt 09/22/20. Will send FYI to surgeon's office pt has appt 09/22/20.

## 2020-09-15 NOTE — Telephone Encounter (Signed)
   Name: [name]  DOB: 30-May-1965  MRN: [id number]  Primary Cardiologist: Virl Axe, MD  Last OV: 05/2019  Chart reviewed as part of pre-operative protocol coverage. Because of [name]'s past medical history and time since last visit, she will require a follow-up visit in order to better assess preoperative cardiovascular risk.  Pre-op covering staff: - Please schedule appointment and call patient to inform them. If patient already had an upcoming appointment within acceptable timeframe, please add "pre-op clearance" to the appointment notes so provider is aware. - Please contact requesting surgeon's office via preferred method (i.e, phone, fax) to inform them of need for appointment prior to surgery.   Richardson Dopp, PA-C  09/15/2020, 5:15 PM

## 2020-09-16 NOTE — Telephone Encounter (Signed)
I tried to reach the pt to d/w her to start holding her ASA on 09/17/20 to prepare for her upcoming surgery. Pt appt with Chanetta Marshall, NP is 09/22/20. I left a detailed message with the instructions to begin holding the ASA as of tomorrow, so do take anymore ASA after today until after her surgery.  I will send updated notes from pre op provider to NP for upcoming appt as well as FYI to surgeon's office.

## 2020-09-16 NOTE — Telephone Encounter (Signed)
Pt does not have a hx of PCI.  Office visit is in close proximity to surgery date.  She is to have spine surgery.  PLAN: Please tell pt she can start holding ASA on 5/19. See Chanetta Marshall, NP as scheduled for surgical clearance.  Richardson Dopp, PA-C    09/16/2020 8:33 AM

## 2020-09-16 NOTE — Telephone Encounter (Signed)
Pt called back and has been advised to take her ASA today as usual and will then hold taking anymore ASA until surgeon's feels safe when to resume after her surgery. Pt thanked me for our help and gave verbal understanding to ASA instructions x 2.

## 2020-09-21 NOTE — Progress Notes (Signed)
Electrophysiology Office Note Date: 09/22/2020  ID:  [name], [name] 1965-08-04, MRN [id number]  PCP: [name], NP Electrophysiologist: Caryl Comes  CC: routine follow up/ surgical clearance  [name] is a 55 y.o. female seen today for Dr Caryl Comes.  He presents today for routine electrophysiology followup.  Since last being seen in our clinic, the patient reports doing very well.  He denies chest pain, palpitations, dyspnea, PND, orthopnea, nausea, vomiting, dizziness, syncope, edema, weight gain, or early satiety.  Past Medical History:  Diagnosis Date  . Anemia   . Anxiety   . Arthritis    osteoarthritis  . Atypical chest pain    negative Myoview in 2011 with no ischemi and normal LV function.   . Benign neoplasm of colon   . Depression   . DVT (deep venous thrombosis) ([place])   . Dyspnea   . GERD (gastroesophageal reflux disease)    uses Nexium  . HTN (hypertension)   . Long Q-T syndrome   . Obesity   . OSA (obstructive sleep apnea)    uses C-PAP  . PE (pulmonary embolism)   . Peripheral vascular disease ([place])    blood clot in right leg  . Prolonged QT interval    gene positive  . Seizures ([place])    hasn't had any for 25 years   Past Surgical History:  Procedure Laterality Date  . ANTERIOR CERVICAL DECOMP/DISCECTOMY FUSION N/A 06/25/2013   Procedure: ANTERIOR CERVICAL DECOMPRESSION/DISCECTOMY FUSION 2 LEVELS LEVELS C FOUR/FIVE, SIX/SEVEN;  Surgeon: Floyce Stakes, MD;  Location: [facility];  Service: Neurosurgery;  Laterality: N/A;  . ANTERIOR CERVICAL DECOMP/DISCECTOMY FUSION N/A 03/29/2016   Procedure: CERVICAL THREE-FOUR, CERVICAL FOUR-FIVE ANTERIOR CERVICAL DECOMPRESSION/DISCECTOMY/FUSION WITH REMOVAL OF PLATES AT C4-8 G8-9;  Surgeon: Leeroy Cha, MD;  Location: [place];  Service: Neurosurgery;  Laterality: N/A;  C3-4 C5-6 ANTERIOR CERVICAL DECOMPRESSION/DISCECTOMY/FUSION WITH POSSIBLE REMOVAL OF PLATE AT V6-9 I5-0  . BACK SURGERY    . CARDIAC  CATHETERIZATION    . COLONOSCOPY    . COLONOSCOPY WITH PROPOFOL N/A 01/16/2018   Procedure: COLONOSCOPY WITH PROPOFOL;  Surgeon: Lollie Sails, MD;  Location: [facility] ENDOSCOPY;  Service: Endoscopy;  Laterality: N/A;  . DILATION AND CURETTAGE OF UTERUS    . ESOPHAGOGASTRODUODENOSCOPY (EGD) WITH PROPOFOL N/A 11/17/2015   Procedure: ESOPHAGOGASTRODUODENOSCOPY (EGD) WITH PROPOFOL;  Surgeon: Lollie Sails, MD;  Location[facility]OSCOPY;  Service: Endoscopy;  Laterality: N/A;  . ESOPHAGOGASTRODUODENOSCOPY (EGD) WITH PROPOFOL N/A 01/16/2018   Procedure: ESOPHAGOGASTRODUODENOSCOPY (EGD) WITH PROPOFOL;  Surgeon: Lollie Sails, MD;  Location: [facility] ENDOSCOPY;  Service: Endoscopy;  Laterality: N/A;  . JOINT REPLACEMENT  04/2006   hip  . left foot surgery    . left hand surgery x 2    . LUMBAR DISC SURGERY    . right hand surgery for torn ligament    . TOTAL HIP ARTHROPLASTY      Current Outpatient Medications  Medication Sig Dispense Refill  . Accu-Chek Softclix Lancets lancets Use as instructed 100 each 12  . albuterol (PROVENTIL HFA;VENTOLIN HFA) 108 (90 BASE) MCG/ACT inhaler Inhale 1-2 puffs into the lungs every 4 (four) hours as needed for wheezing or shortness of breath.    Marland Kitchen aspirin EC 81 MG tablet Take 81 mg by mouth daily. Swallow whole.    . Blood Glucose Monitoring Suppl (ACCU-CHEK GUIDE) w/Device KIT USE 1 (ONE) KIT AS DIRECTED    . budesonide-formoterol (SYMBICORT) 160-4.5 MCG/ACT inhaler Inhale 2 puffs  into the lungs 2 (two) times daily as needed (for shortness of breath).    . cyclobenzaprine (FLEXERIL) 10 MG tablet Take 2 tablets (20 mg total) by mouth at bedtime. 30 tablet 0  . escitalopram (LEXAPRO) 20 MG tablet Take 1 tablet (20 mg total) by mouth daily. 90 tablet 0  . ferrous sulfate 325 (65 FE) MG tablet Take 325 mg by mouth daily with breakfast.    . gabapentin (NEURONTIN) 600 MG tablet Take 1 tablet (600 mg total) by mouth 3 (three) times daily with meals. 90 tablet 1   . Glucosamine-Chondroit-Vit C-Mn (GLUCOSAMINE 1500 COMPLEX) CAPS Take 1 each by mouth daily.    Marland Kitchen ibuprofen (ADVIL) 800 MG tablet Take 800 mg by mouth 3 (three) times daily.    Marland Kitchen labetalol (NORMODYNE) 200 MG tablet Take 1 tablet (200 mg total) by mouth 2 (two) times daily. Please keep upcoming appt with Dr. Caryl Comes in January before anymore refills. Thank you 180 tablet 0  . lisinopril (ZESTRIL) 2.5 MG tablet Take 2.5 mg by mouth daily.    Marland Kitchen loratadine (CLARITIN) 10 MG tablet Take 1 tablet (10 mg total) by mouth daily. 90 tablet 3  . metFORMIN (GLUCOPHAGE) 500 MG tablet Take 500 mg by mouth daily.    . Multiple Vitamin (MULTIVITAMIN WITH MINERALS) TABS tablet Take 1 tablet by mouth daily. 90 tablet 3  . Multiple Vitamins-Minerals (MULTIVITAMIN WITH MINERALS) tablet Take 1 tablet by mouth daily.    . nabumetone (RELAFEN) 750 MG tablet nabumetone 750 mg tablet    . NARCAN 4 MG/0.1ML LIQD nasal spray kit Place 1 spray into the nose See admin instructions. As needed for opioid overdose.    . Omega-3 Fatty Acids (FISH OIL) 1000 MG CAPS Take 1,000 mg by mouth daily.    Marland Kitchen omeprazole (PRILOSEC) 40 MG capsule Take 40 mg by mouth daily.    Marland Kitchen OZEMPIC, 1 MG/DOSE, 4 MG/3ML SOPN Inject 1 mg into the skin every 7 (seven) days. Thursdays    . potassium chloride SA (KLOR-CON M20) 20 MEQ tablet One daily 60 tablet 1  . pravastatin (PRAVACHOL) 20 MG tablet Take 20 mg by mouth daily.    Marland Kitchen spironolactone (ALDACTONE) 25 MG tablet Take 25 mg by mouth daily.    Marland Kitchen triamterene-hydrochlorothiazide (MAXZIDE) 75-50 MG tablet Take 1 tablet by mouth daily.    . Vitamin D, Ergocalciferol, (DRISDOL) 1.25 MG (50000 UNIT) CAPS capsule Take 50,000 Units by mouth once a week.     No current facility-administered medications for this visit.    Allergies:   Patient has no known allergies.   Social History: Social History   Socioeconomic History  . Marital status: Single    Spouse name: Not on file  . Number of children: Not  on file  . Years of education: Not on file  . Highest education level: Not on file  Occupational History  . Occupation: disabled  Tobacco Use  . Smoking status: Former Smoker    Quit date: 08/16/2001    Years since quitting: 19.1  . Smokeless tobacco: Never Used  Vaping Use  . Vaping Use: Never used  Substance and Sexual Activity  . Alcohol use: Yes    Alcohol/week: 2.0 standard drinks    Types: 2 Standard drinks or equivalent per week    Comment: very rare  . Drug use: Not Currently    Comment: cocaine remission abstinent since 01/2008  . Sexual activity: Never  Other Topics Concern  . Not on file  Social History Narrative  . Not on file   Social Determinants of Health   Financial Resource Strain: Not on file  Food Insecurity: Not on file  Transportation Needs: Not on file  Physical Activity: Not on file  Stress: Not on file  Social Connections: Not on file  Intimate Partner Violence: Not on file    Family History: Family History  Problem Relation Age of Onset  . Long QT syndrome Mother   . CAD Mother   . Diabetes Father   . CAD Father   . Diabetes Brother   . Breast cancer Maternal Aunt 70  . Breast cancer Paternal Aunt   . Breast cancer Maternal Aunt 40    Review of Systems: All other systems reviewed and are otherwise negative except as noted above.   Physical Exam: VS:  BP 110/72   Pulse 83   Ht 5' 4" (1.626 m)   Wt 256 lb (116.1 kg)   SpO2 98%   BMI 43.94 kg/m  , BMI Body mass index is 43.94 kg/m. Wt Readings from Last 3 Encounters:  09/22/20 256 lb (116.1 kg)  01/01/20 274 lb (124.3 kg)  05/24/19 293 lb (132.9 kg)    GEN- The patient is well appearing, alert and oriented x 3 today.   HEENT: normocephalic, atraumatic; sclera clear, conjunctiva pink; hearing intact; oropharynx clear; neck supple, no JVP Lymph- no cervical lymphadenopathy Lungs- Clear to ausculation bilaterally, normal work of breathing.  No wheezes, rales, rhonchi Heart-  Regular rate and rhythm, no murmurs, rubs or gallops, PMI not laterally displaced GI- soft, non-tender, non-distended, bowel sounds present, no hepatosplenomegaly Extremities- no clubbing, cyanosis, or edema; DP/PT/radial pulses 2+ bilaterally MS- no significant deformity or atrophy Skin- warm and dry, no rash or lesion  Psych- euthymic mood, full affect Neuro- strength and sensation are intact   EKG:  EKG is ordered today. The ekg ordered today shows sinus rhythm, rate 83, QTc 41mec  Recent Labs: 01/04/2020: Magnesium 2.2 01/07/2020: ALT 61; BUN 19; Creatinine, Ser 0.73; Hemoglobin 13.5; Platelets 339; Potassium 4.4; Sodium 137    Other studies Reviewed: Additional studies/ records that were reviewed today include: Dr KOlin Piaoffice notes  Assessment and Plan:  1.  Long QT syndrome Continue BB  2.  HTN Stable No change required today  3.  Surgical clearance She is pending posterior cervical decompression with Dr DLynann Bologna Perioperative risk of major cardiac events is 0.96% Functional mets 6.27 I do not think any further cardiac testing would change pre-operative risks Avoid QT prolonging medications Continue BB perioperatively   4.  Obesity Body mass index is 43.94 kg/m. She is working on losing weight, continued efforts encouraged    Current medicines are reviewed at length with the patient today.   The patient does not have concerns regarding her medicines.  The following changes were made today:  none  Labs/ tests ordered today include: none No orders of the defined types were placed in this encounter.    Disposition:   Follow up with Dr KCaryl Comes1 year     Signed, AChanetta Marshall NP 09/22/2020 8:20 AM   [facility] [address] [phone]) [phone](fax)

## 2020-09-21 NOTE — Pre-Procedure Instructions (Signed)
Surgical Instructions:    Your procedure is scheduled on Thursday, May 26th (07:30 AM- 11:19 AM).  Report to [facility] Main Entrance "A" at 05:30 A.M., then check in with the Admitting office.  Call this number if you have any questions prior to, or have any problems the morning of surgery:  [phone]    Remember:  Do not eat after midnight the night before your surgery.  You may drink clear liquids until 04:30 AM the morning of your surgery.   Clear liquids allowed are: Water, Non-Citrus Juices (without pulp), Carbonated Beverages, Clear Tea, Black Coffee Only, and Gatorade.  Please complete your PRE-SURGERY 10 oz Bottled Water that was provided to you by 04:30 AM the morning of surgery.  Please, if able, drink it in one setting. DO NOT SIP.      Take these medicines the morning of surgery with A SIP OF WATER: escitalopram (LEXAPRO) gabapentin (NEURONTIN) labetalol (NORMODYNE) loratadine (CLARITIN) omeprazole (PRILOSEC) pravastatin (PRAVACHOL)   IF NEEDED: albuterol (PROVENTIL HFA;VENTOLIN HFA) inhaler budesonide-formoterol (SYMBICORT) inhaler  Please bring all inhalers with you the day of surgery.   As of today, STOP taking any Aspirin (unless otherwise instructed by your surgeon), nabumetone (RELAFEN), Aleve, Naproxen, Ibuprofen, Motrin, Advil, Goody's, BC's, all herbal medications, fish oil, and all vitamins.           WHAT DO I DO ABOUT MY DIABETES MEDICATION?  Marland Kitchen Do not take metFORMIN (GLUCOPHAGE) the morning of surgery. . The day of surgery, do not take other diabetes injectables, including OZEMPIC.    HOW TO MANAGE YOUR DIABETES BEFORE AND AFTER SURGERY  Why is it important to control my blood sugar before and after surgery? . Improving blood sugar levels before and after surgery helps healing and can limit problems. . A way of improving blood sugar control is eating a healthy diet by: o  Eating less sugar and carbohydrates o  Increasing  activity/exercise o  Talking with your doctor about reaching your blood sugar goals . High blood sugars (greater than 180 mg/dL) can raise your risk of infections and slow your recovery, so you will need to focus on controlling your diabetes during the weeks before surgery. . Make sure that the doctor who takes care of your diabetes knows about your planned surgery including the date and location.  How do I manage my blood sugar before surgery? . Check your blood sugar at least 4 times a day, starting 2 days before surgery, to make sure that the level is not too high or low. . Check your blood sugar the morning of your surgery when you wake up and every 2 hours until you get to the Short Stay unit. o If your blood sugar is less than 70 mg/dL, you will need to treat for low blood sugar: - Do not take insulin. - Treat a low blood sugar (less than 70 mg/dL) with  cup of clear juice (cranberry or apple), 4 glucose tablets, OR glucose gel. - Recheck blood sugar in 15 minutes after treatment (to make sure it is greater than 70 mg/dL). If your blood sugar is not greater than 70 mg/dL on recheck, call [phone] for further instructions. . Report your blood sugar to the short stay nurse when you get to Short Stay.  . If you are admitted to the hospital after surgery: o Your blood sugar will be checked by the staff and you will probably be given insulin after surgery (instead of oral diabetes medicines) to make sure  you have good blood sugar levels. o The goal for blood sugar control after surgery is 80-180 mg/dL.     Special instructions:   [place]- Preparing For Surgery  Before surgery, you can play an important role. Because skin is not sterile, your skin needs to be as free of germs as possible. You can reduce the number of germs on your skin by washing with CHG (chlorahexidine gluconate) Soap before surgery.  CHG is an antiseptic cleaner which kills germs and bonds with the skin to  continue killing germs even after washing.    Oral Hygiene is also important to reduce your risk of infection.  Remember - BRUSH YOUR TEETH THE MORNING OF SURGERY WITH YOUR REGULAR TOOTHPASTE  Please do not use if you have an allergy to CHG or antibacterial soaps. If your skin becomes reddened/irritated stop using the CHG.  Do not shave (including legs and underarms) for at least 48 hours prior to first CHG shower. It is OK to shave your face.  Please follow these instructions carefully.   1. Shower the NIGHT BEFORE SURGERY and the MORNING OF SURGERY  2. If you chose to wash your hair, wash your hair first as usual with your normal shampoo.  3. After you shampoo, rinse your hair and body thoroughly to remove the shampoo.  4. Wash Face and genitals (private parts) with your normal soap.   5. Use CHG Soap as you would any other liquid soap. You can apply CHG directly to the skin and wash gently with a scrungie or a clean washcloth.   6. Apply the CHG Soap to your body ONLY FROM THE NECK DOWN.  Do not use on open wounds or open sores. Avoid contact with your eyes, ears, mouth and genitals (private parts). Wash Face and genitals (private parts)  with your normal soap.   7. Wash thoroughly, paying special attention to the area where your surgery will be performed.  8. Thoroughly rinse your body with warm water from the neck down.  9. DO NOT shower/wash with your normal soap after using and rinsing off the CHG Soap.  10. Pat yourself dry with a CLEAN TOWEL.  11. Wear CLEAN PAJAMAS to bed the night before surgery.  12. Place CLEAN SHEETS on your bed the night before your surgery.  13. DO NOT SLEEP WITH PETS.   Day of Surgery: SHOWER with CHG soap. Brush your teeth WITH YOUR REGULAR TOOTHPASTE. Wear Clean/Comfortable clothing the morning of surgery. Do not apply any deodorants/lotions.   Do not wear jewelry, make up, or nail polish. Do not shave 48 hours prior to surgery.   Do not  bring valuables to the hospital. [facility] is not responsible for any belongings or valuables.   Do NOT Smoke (Tobacco/Vaping) or drink Alcohol 24 hours prior to your procedure.  If you use a CPAP at night, you may bring all equipment for your overnight stay.   Contacts, glasses, or dentures may not be worn into surgery, please bring cases for these belongings.   For patients admitted to the hospital, discharge time will be determined by your treatment team.   Patients discharged the day of surgery will not be allowed to drive home, and someone needs to stay with them for 24 hours.    Please read over the following fact sheets that you were given.

## 2020-09-22 ENCOUNTER — Encounter (HOSPITAL_COMMUNITY)
Admission: RE | Admit: 2020-09-22 | Discharge: 2020-09-22 | Disposition: A | Discharge status: Home / Self Care | Payer: Medicare HMO | Source: Ambulatory Visit | Attending: Orthopedic Surgery | Admitting: Orthopedic Surgery

## 2020-09-22 ENCOUNTER — Ambulatory Visit (INDEPENDENT_AMBULATORY_CARE_PROVIDER_SITE_OTHER): Payer: Medicare HMO | Admitting: Nurse Practitioner

## 2020-09-22 ENCOUNTER — Encounter: Payer: Self-pay | Admitting: Nurse Practitioner

## 2020-09-22 ENCOUNTER — Encounter (HOSPITAL_COMMUNITY): Payer: Self-pay

## 2020-09-22 ENCOUNTER — Other Ambulatory Visit: Payer: Self-pay

## 2020-09-22 VITALS — BP 110/72 | HR 83 | Ht 64.0 in | Wt 256.0 lb

## 2020-09-22 DIAGNOSIS — D649 Anemia, unspecified: Secondary | ICD-10-CM | POA: Insufficient documentation

## 2020-09-22 DIAGNOSIS — I10 Essential (primary) hypertension: Secondary | ICD-10-CM | POA: Insufficient documentation

## 2020-09-22 DIAGNOSIS — I4581 Long QT syndrome: Secondary | ICD-10-CM | POA: Diagnosis not present

## 2020-09-22 DIAGNOSIS — K219 Gastro-esophageal reflux disease without esophagitis: Secondary | ICD-10-CM | POA: Insufficient documentation

## 2020-09-22 DIAGNOSIS — Z7901 Long term (current) use of anticoagulants: Secondary | ICD-10-CM | POA: Insufficient documentation

## 2020-09-22 DIAGNOSIS — Z01810 Encounter for preprocedural cardiovascular examination: Secondary | ICD-10-CM | POA: Diagnosis not present

## 2020-09-22 DIAGNOSIS — F32A Depression, unspecified: Secondary | ICD-10-CM | POA: Insufficient documentation

## 2020-09-22 DIAGNOSIS — Z01818 Encounter for other preprocedural examination: Secondary | ICD-10-CM

## 2020-09-22 DIAGNOSIS — Z7984 Long term (current) use of oral hypoglycemic drugs: Secondary | ICD-10-CM | POA: Insufficient documentation

## 2020-09-22 DIAGNOSIS — Z79899 Other long term (current) drug therapy: Secondary | ICD-10-CM | POA: Insufficient documentation

## 2020-09-22 DIAGNOSIS — Z20822 Contact with and (suspected) exposure to covid-19: Secondary | ICD-10-CM | POA: Insufficient documentation

## 2020-09-22 DIAGNOSIS — Z87891 Personal history of nicotine dependence: Secondary | ICD-10-CM | POA: Insufficient documentation

## 2020-09-22 DIAGNOSIS — Z7982 Long term (current) use of aspirin: Secondary | ICD-10-CM | POA: Insufficient documentation

## 2020-09-22 DIAGNOSIS — G4733 Obstructive sleep apnea (adult) (pediatric): Secondary | ICD-10-CM | POA: Insufficient documentation

## 2020-09-22 DIAGNOSIS — M4322 Fusion of spine, cervical region: Secondary | ICD-10-CM | POA: Insufficient documentation

## 2020-09-22 DIAGNOSIS — Z86718 Personal history of other venous thrombosis and embolism: Secondary | ICD-10-CM | POA: Insufficient documentation

## 2020-09-22 DIAGNOSIS — F419 Anxiety disorder, unspecified: Secondary | ICD-10-CM | POA: Insufficient documentation

## 2020-09-22 DIAGNOSIS — E119 Type 2 diabetes mellitus without complications: Secondary | ICD-10-CM | POA: Insufficient documentation

## 2020-09-22 DIAGNOSIS — Z01812 Encounter for preprocedural laboratory examination: Secondary | ICD-10-CM | POA: Insufficient documentation

## 2020-09-22 DIAGNOSIS — R011 Cardiac murmur, unspecified: Secondary | ICD-10-CM | POA: Insufficient documentation

## 2020-09-22 HISTORY — DX: Personal history of other diseases of the digestive system: Z87.19

## 2020-09-22 HISTORY — DX: Type 2 diabetes mellitus without complications: E11.9

## 2020-09-22 HISTORY — DX: Pneumonia, unspecified organism: J18.9

## 2020-09-22 HISTORY — DX: Cardiac murmur, unspecified: R01.1

## 2020-09-22 LAB — COMPREHENSIVE METABOLIC PANEL
ALT: 16 U/L (ref 0–44)
AST: 17 U/L (ref 15–41)
Albumin: 4.1 g/dL (ref 3.5–5.0)
Alkaline Phosphatase: 78 U/L (ref 38–126)
Anion gap: 9 (ref 5–15)
BUN: 12 mg/dL (ref 6–20)
CO2: 26 mmol/L (ref 22–32)
Calcium: 10.2 mg/dL (ref 8.9–10.3)
Chloride: 103 mmol/L (ref 98–111)
Creatinine, Ser: 0.97 mg/dL (ref 0.44–1.00)
GFR, Estimated: >60 mL/min (ref >60)
Glucose, Bld: 93 mg/dL (ref 70–99)
Potassium: 3.7 mmol/L (ref 3.5–5.1)
Sodium: 138 mmol/L (ref 135–145)
Total Bilirubin: 0.7 mg/dL (ref 0.3–1.2)
Total Protein: 7.4 g/dL (ref 6.5–8.1)

## 2020-09-22 LAB — CBC WITH DIFFERENTIAL/PLATELET
Abs Immature Granulocytes: 0.01 10*3/uL (ref 0.00–0.07)
Basophils Absolute: 0.1 10*3/uL (ref 0.0–0.1)
Basophils Relative: 1 %
Eosinophils Absolute: 0.1 10*3/uL (ref 0.0–0.5)
Eosinophils Relative: 2 %
HCT: 42.3 % (ref 36.0–46.0)
Hemoglobin: 13.6 g/dL (ref 12.0–15.0)
Immature Granulocytes: 0 %
Lymphocytes Relative: 37 %
Lymphs Abs: 2.3 10*3/uL (ref 0.7–4.0)
MCH: 29.2 pg (ref 26.0–34.0)
MCHC: 32.2 g/dL (ref 30.0–36.0)
MCV: 90.8 fL (ref 80.0–100.0)
Monocytes Absolute: 0.3 10*3/uL (ref 0.1–1.0)
Monocytes Relative: 5 %
Neutro Abs: 3.4 10*3/uL (ref 1.7–7.7)
Neutrophils Relative %: 55 %
Platelets: 275 10*3/uL (ref 150–400)
RBC: 4.66 MIL/uL (ref 3.87–5.11)
RDW: 13.2 % (ref 11.5–15.5)
WBC: 6.2 10*3/uL (ref 4.0–10.5)
nRBC: 0 % (ref 0.0–0.2)

## 2020-09-22 LAB — TYPE AND SCREEN
ABO/RH(D): A POS
Antibody Screen: NEGATIVE

## 2020-09-22 LAB — URINALYSIS, ROUTINE W REFLEX MICROSCOPIC
Bilirubin Urine: NEGATIVE
Glucose, UA: NEGATIVE mg/dL
Hgb urine dipstick: NEGATIVE
Ketones, ur: NEGATIVE mg/dL
Nitrite: NEGATIVE
Protein, ur: NEGATIVE mg/dL
Specific Gravity, Urine: 1.017 (ref 1.005–1.030)
pH: 6 (ref 5.0–8.0)

## 2020-09-22 LAB — SURGICAL PCR SCREEN
MRSA, PCR: NEGATIVE
Staphylococcus aureus: NEGATIVE

## 2020-09-22 LAB — HEMOGLOBIN A1C
Hgb A1c MFr Bld: 5.8 % — ABNORMAL HIGH (ref 4.8–5.6)
Mean Plasma Glucose: 120 mg/dL

## 2020-09-22 LAB — PROTIME-INR
INR: 1 (ref 0.8–1.2)
Prothrombin Time: 12.9 seconds (ref 11.4–15.2)

## 2020-09-22 LAB — APTT: aPTT: 28 seconds (ref 24–36)

## 2020-09-22 LAB — GLUCOSE, CAPILLARY: Glucose-Capillary: 98 mg/dL (ref 70–99)

## 2020-09-22 LAB — SARS CORONAVIRUS 2 (TAT 6-24 HRS): SARS Coronavirus 2: NEGATIVE

## 2020-09-22 NOTE — Patient Instructions (Addendum)
Medication Instructions:  Your physician recommends that you continue on your current medications as directed. Please refer to the Current Medication list given to you today.  Labwork: None ordered.  Testing/Procedures: None ordered.  Follow-Up: Your physician recommends that you schedule a follow-up appointment in:   12 months with Dr. Caryl Comes.  You may also consider Dr. Quentin Ore or Dr. Curt Bears.   Any Other Special Instructions Will Be Listed Below (If Applicable).     If you need a refill on your cardiac medications before your next appointment, please call your pharmacy.

## 2020-09-22 NOTE — Progress Notes (Signed)
PCP - Pricilla Loveless, MD, formerly [name], NP Cardiologist - Amber K. Lynnell Jude, NP Electrophysiologist- Virl Axe, MD  PPM/ICD - Denies  Chest x-ray - 01/03/20 EKG - 01/07/20 Stress Test - 01/19/09 ECHO - 04/06/16 Cardiac Cath - Denies  Sleep Study - Yes, positive for OSA CPAP - Yes, nightly  Fasting Blood Sugar ~102 Checks Blood Sugar 3 times a day  Blood Thinner Instructions: N/A Aspirin Instructions: Per pt, last dose 09/16/20  ERAS Protcol - Yes PRE-SURGERY Ensure or G2- 10 oz Bottled Water given  COVID TEST- 09/22/20 (pt booked as "Surgery Admit")   Anesthesia review: Yes, cardiac hx.  Patient denies shortness of breath, fever, cough and chest pain at PAT appointment   All instructions explained to the patient, with a verbal understanding of the material. Patient agrees to go over the instructions while at home for a better understanding. Patient also instructed to self quarantine after being tested for COVID-19. The opportunity to ask questions was provided.

## 2020-09-23 NOTE — Anesthesia Preprocedure Evaluation (Addendum)
Anesthesia Evaluation  Patient identified by MRN, date of birth, ID band Patient awake    Reviewed: Allergy & Precautions, NPO status , Patient's Chart, lab work & pertinent test results, reviewed documented beta blocker date and time   Airway Mallampati: III  TM Distance: >3 FB Neck ROM: Limited    Dental no notable dental hx. (+) Teeth Intact, Dental Advisory Given   Pulmonary shortness of breath, sleep apnea and Continuous Positive Airway Pressure Ventilation , former smoker, PE   Pulmonary exam normal breath sounds clear to auscultation       Cardiovascular hypertension, Pt. on home beta blockers and Pt. on medications + Peripheral Vascular Disease and + DVT  Normal cardiovascular exam+ dysrhythmias (prolonged QT)  Rhythm:Regular Rate:Normal  CV: Cardiac event monitor 11/23/16-12/22/16: Study Highlights Indication:syncope with hx of Long QT Duration: 13d Findings Rare PVCs Symptomatic lightheadedness assoc with sinus rhythm No malignant arrhythmia    Echo 04/06/16: Study Conclusions  - Left ventricle: The cavity size was normal. Wall thickness was  normal. Systolic function was normal. The estimated ejection  fraction was in the range of 55% to 60%. Wall motion was normal;  there were no regional wall motion abnormalities. Doppler  parameters are consistent with abnormal left ventricular  relaxation (grade 1 diastolic dysfunction).  - Mitral valve: There was mild regurgitation.  - Pulmonary arteries: Systolic pressure was mildly increased. PA  peak pressure: 36 mm Hg (S).    Nuclear stress test 01/19/09:  Normal stress nuclear study, RV appeared dilated with significant dyspnea, EF 60%   Neuro/Psych Seizures - (remote history),  PSYCHIATRIC DISORDERS Anxiety Depression    GI/Hepatic Neg liver ROS, hiatal hernia, GERD  ,  Endo/Other  diabetes, Oral Hypoglycemic AgentsMorbid obesity (BMI 44)   Renal/GU negative Renal ROS  negative genitourinary   Musculoskeletal  (+) Arthritis , S/p C4-5/C6-7 ACDF 06/25/13; C3-4/C5-6 ACDF, removal plates C4-5/C6-711/28/17   Abdominal   Peds  Hematology negative hematology ROS (+)   Anesthesia Other Findings   Reproductive/Obstetrics                           Anesthesia Physical Anesthesia Plan  ASA: III  Anesthesia Plan: General   Post-op Pain Management:    Induction: Intravenous  PONV Risk Score and Plan: 3 and Midazolam, Dexamethasone and Ondansetron  Airway Management Planned: Oral ETT and Video Laryngoscope Planned  Additional Equipment:   Intra-op Plan:   Post-operative Plan: Extubation in OR  Informed Consent: I have reviewed the patients History and Physical, chart, labs and discussed the procedure including the risks, benefits and alternatives for the proposed anesthesia with the patient or authorized representative who has indicated his/her understanding and acceptance.     Dental advisory given  Plan Discussed with: CRNA  Anesthesia Plan Comments: (Plan to avoid medications that prolong QT. )       Anesthesia Quick Evaluation

## 2020-09-23 NOTE — Progress Notes (Signed)
Anesthesia Chart Review:  Case: [id number] Date/Time: 09/24/20 0715   Procedure: POSTERIOR CERVICAL DECOMPRESSION FUSION CERVICAL 3 - CERVICAL 7 WITH INSTRUMENTATION AND ALLOGRAFT (N/A )   Anesthesia type: General   Pre-op diagnosis: SYMPTOMATIC NONUNIONS SPANNING CERVICAL 3- CERVICAL 7   Location: [facility] OR ROOM 05 / [facility]   Surgeons: Phylliss Bob, MD      DISCUSSION: Patient is a 55 year old female scheduled for the above procedure.  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 BMI correlates with morbid obesity.  Last cardiology evaluation by Chanetta Marshall, NP on 09/22/20 for surgical clearance. She wrote, "Surgical clearance She is pending posterior cervical decompression with Dr Lynann Bologna. Perioperative risk of major cardiac events is 0.96% Functional mets 6.27 I do not think any further cardiac testing would change pre-operative risks Avoid QT prolonging medications Continue BB perioperatively".   Previously given cardiology permission to hold ASA for surgery starting 09/17/20. Last ASA 09/16/20.   09/22/20 presurgical COVID-19 test negative. Anesthesia team to evaluate on the day of surgery.   VS: BP 115/75   Pulse 73   Temp 36.9 C (Oral)   Resp 18   Wt 114.9 kg   LMP 12/05/2016   SpO2 99%   BMI 43.50 kg/m    PROVIDERS: [name], NP is listed as PCP, but reportedly now sees Daiva Eves, MD. She also has  March 2022 evaluation with pulmonologist Asencion Noble, MD at the [facility] for primary care (noting previous PCP in [place], [place]).   Virl Axe, MD is EP cardiologist   LABS: Labs reviewed: Acceptable for surgery. UA ordered by surgeon and message left for scheduler--trace leukocytes, negative nitrites, many bacteria. Defer additional recommendations, if any, to surgical team. (all labs ordered are listed, but only abnormal results are displayed)  Labs Reviewed  URINALYSIS, ROUTINE W REFLEX MICROSCOPIC - Abnormal; Notable for the following components:      Result Value   APPearance HAZY (*)    Leukocytes,Ua TRACE (*)    Bacteria, UA MANY (*)    All other components within normal limits  HEMOGLOBIN A1C - Abnormal; Notable for the following components:   Hgb A1c MFr Bld 5.8 (*)    All other components within normal limits  SURGICAL PCR SCREEN  SARS CORONAVIRUS 2 (TAT 6-24 HRS)  GLUCOSE, CAPILLARY  CBC WITH DIFFERENTIAL/PLATELET  COMPREHENSIVE METABOLIC PANEL  PROTIME-INR  APTT  TYPE AND SCREEN    IMAGES: CT C-spine 07/17/20: IMPRESSION: 1. ACDF at C3-4, C5-6, and C6-7 with only minimal bridging bone anteriorly at C3-4 and no solid fusion at C5-6 or C6-7. 2. Loosening of the C6 screw with the screw and plate having backed out away from the vertebra. 3. Remote, solid C4-5 interbody fusion. 4. No acute fracture identified.   EKG: 09/22/20 ([facility]): SR at 83 bpm. QTc 423 msec.   CV: Cardiac event monitor 11/23/16-12/22/16: Study Highlights Indication:syncope with hx of Long QT Duration: 13d Findings Rare PVCs Symptomatic lightheadedness assoc with sinus rhythm No malignant arrhythmia  Echo 04/06/16: Study Conclusions  - Left ventricle: The cavity size was normal. Wall thickness was  normal. Systolic function was normal. The estimated ejection  fraction was in the range of 55% to 60%. Wall motion was normal;  there were no regional wall  motion abnormalities. Doppler  parameters are consistent with abnormal left ventricular  relaxation (grade 1 diastolic dysfunction).  - Mitral valve: There was mild regurgitation.  - Pulmonary arteries: Systolic pressure was mildly increased. PA  peak pressure: 36 mm Hg (S).    Nuclear stress test 01/19/09:  Normal stress nuclear study, RV appeared dilated with significant dyspnea, EF 60%.   Past Medical History:  Diagnosis Date  . Anemia   . Anxiety   . Arthritis    osteoarthritis  . Atypical chest pain    negative Myoview in 2011 with no ischemi and normal LV function.   . Benign neoplasm of colon   . Depression   . Diabetes mellitus without complication ([place])   . DVT (deep venous thrombosis) ([place])   . Dyspnea   . GERD (gastroesophageal reflux disease)    uses Nexium  . Heart murmur   . History of hiatal hernia   . HTN (hypertension)   . Long Q-T syndrome   . Obesity   . OSA (obstructive sleep apnea)    uses C-PAP  . PE (pulmonary embolism)   . Peripheral vascular disease ([place])    blood clot in right leg  . Pneumonia   . Prolonged QT interval    gene positive  . Seizures ([place])    hasn't had any for 25 years    Past Surgical History:  Procedure Laterality Date  . ANTERIOR CERVICAL DECOMP/DISCECTOMY FUSION N/A 06/25/2013   Procedure: ANTERIOR CERVICAL DECOMPRESSION/DISCECTOMY FUSION 2 LEVELS LEVELS C FOUR/FIVE, SIX/SEVEN;  Surgeon: Floyce Stakes, MD;  Location: [facility]S;  Service: Neurosurgery;  Laterality: N/A;  . ANTERIOR CERVICAL DECOMP/DISCECTOMY FUSION N/A 03/29/2016   Procedure: CERVICAL THREE-FOUR, CERVICAL FOUR-FIVE ANTERIOR CERVICAL DECOMPRESSION/DISCECTOMY/FUSION WITH REMOVAL OF PLATES AT G8-9 V6-9;  Surgeon: Leeroy Cha, MD;  Location: [facility];  Service: Neurosurgery;  Laterality: N/A;  C3-4 C5-6 ANTERIOR CERVICAL DECOMPRESSION/DISCECTOMY/FUSION WITH POSSIBLE REMOVAL OF PLATE AT I5-0 T8-8  . BACK SURGERY    . CARDIAC CATHETERIZATION    .  COLONOSCOPY    . COLONOSCOPY WITH PROPOFOL N/A 01/16/2018   Procedure: COLONOSCOPY WITH PROPOFOL;  Surgeon: Lollie Sails, MD;  Location: [facility] ENDOSCOPY;  Service: Endoscopy;  Laterality: N/A;  . DILATION AND CURETTAGE OF UTERUS    . ESOPHAGOGASTRODUODENOSCOPY (EGD) WITH PROPOFOL N/A 11/17/2015   Procedure: ESOPHAGOGASTRODUODENOSCOPY (EGD) WITH PROPOFOL;  Surgeon: Lollie Sails, MD;  Location: [facility] ENDOSCOPY;  Service: Endoscopy;  Laterality: N/A;  . ESOPHAGOGASTRODUODENOSCOPY (EGD) WITH PROPOFOL N/A 01/16/2018   Procedure: ESOPHAGOGASTRODUODENOSCOPY (EGD) WITH PROPOFOL;  Surgeon: Lollie Sails, MD;  Location: [facility] ENDOSCOPY;  Service: Endoscopy;  Laterality: N/A;  . JOINT REPLACEMENT  04/2006   hip  . left foot surgery    . left hand surgery x 2    . LUMBAR DISC SURGERY    . right hand surgery for torn ligament    . TOTAL HIP ARTHROPLASTY      MEDICATIONS: . Accu-Chek Softclix Lancets lancets  . albuterol (PROVENTIL HFA;VENTOLIN HFA) 108 (90 BASE) MCG/ACT inhaler  . aspirin EC 81 MG tablet  . budesonide-formoterol (SYMBICORT) 160-4.5 MCG/ACT inhaler  . cyclobenzaprine (FLEXERIL) 10 MG tablet  . escitalopram (LEXAPRO) 20 MG tablet  . ferrous sulfate 325 (  65 FE) MG tablet  . fluticasone (FLONASE) 50 MCG/ACT nasal spray  . gabapentin (NEURONTIN) 600 MG tablet  . Glucosamine-Chondroit-Vit C-Mn (GLUCOSAMINE 1500 COMPLEX) CAPS  . ibuprofen (ADVIL) 800 MG tablet  . labetalol (NORMODYNE) 200 MG tablet  . lisinopril (ZESTRIL) 2.5 MG tablet  . loratadine (CLARITIN) 10 MG tablet  . metFORMIN (GLUCOPHAGE) 500 MG tablet  . Multiple Vitamin (MULTIVITAMIN WITH MINERALS) TABS tablet  . nabumetone (RELAFEN) 750 MG tablet  . NARCAN 4 MG/0.1ML LIQD nasal spray kit  . Omega-3 Fatty Acids (FISH OIL) 1000 MG CAPS  . omeprazole (PRILOSEC) 40 MG capsule  . OZEMPIC, 1 MG/DOSE, 4 MG/3ML SOPN  . potassium chloride SA (KLOR-CON M20) 20 MEQ tablet  . pravastatin (PRAVACHOL) 20 MG tablet  .  spironolactone (ALDACTONE) 25 MG tablet  . triamterene-hydrochlorothiazide (MAXZIDE) 75-50 MG tablet  . Vitamin D, Ergocalciferol, (DRISDOL) 1.25 MG (50000 UNIT) CAPS capsule   No current facility-administered medications for this encounter.    Allison Zelenak, PA-C Surgical Short Stay/Anesthesiology [facility] Phone [phone] [facility] Phone [phone] 09/23/2020 12:34 PM       

## 2020-09-24 ENCOUNTER — Inpatient Hospital Stay (HOSPITAL_COMMUNITY): Payer: Medicare HMO | Admitting: Vascular Surgery

## 2020-09-24 ENCOUNTER — Other Ambulatory Visit: Payer: Self-pay

## 2020-09-24 ENCOUNTER — Encounter (HOSPITAL_COMMUNITY)
Admission: RE | Disposition: A | Discharge status: Home / Self Care | Payer: Self-pay | Source: Home / Self Care | Attending: Orthopedic Surgery

## 2020-09-24 ENCOUNTER — Inpatient Hospital Stay (HOSPITAL_COMMUNITY): Payer: Medicare HMO | Admitting: Certified Registered Nurse Anesthetist

## 2020-09-24 ENCOUNTER — Inpatient Hospital Stay (HOSPITAL_COMMUNITY)
Admission: RE | Admit: 2020-09-24 | Discharge: 2020-09-25 | DRG: 473 | Disposition: A | Discharge status: Home / Self Care | Payer: Medicare HMO | Attending: Orthopedic Surgery | Admitting: Orthopedic Surgery

## 2020-09-24 ENCOUNTER — Inpatient Hospital Stay (HOSPITAL_COMMUNITY): Payer: Medicare HMO

## 2020-09-24 ENCOUNTER — Encounter (HOSPITAL_COMMUNITY): Payer: Self-pay | Admitting: Orthopedic Surgery

## 2020-09-24 DIAGNOSIS — Z6841 Body Mass Index (BMI) 40.0 and over, adult: Secondary | ICD-10-CM

## 2020-09-24 DIAGNOSIS — F419 Anxiety disorder, unspecified: Secondary | ICD-10-CM | POA: Diagnosis present

## 2020-09-24 DIAGNOSIS — Z86718 Personal history of other venous thrombosis and embolism: Secondary | ICD-10-CM

## 2020-09-24 DIAGNOSIS — Z87891 Personal history of nicotine dependence: Secondary | ICD-10-CM

## 2020-09-24 DIAGNOSIS — S129XXD Fracture of neck, unspecified, subsequent encounter: Secondary | ICD-10-CM

## 2020-09-24 DIAGNOSIS — Z419 Encounter for procedure for purposes other than remedying health state, unspecified: Secondary | ICD-10-CM

## 2020-09-24 DIAGNOSIS — Z7952 Long term (current) use of systemic steroids: Secondary | ICD-10-CM

## 2020-09-24 DIAGNOSIS — I10 Essential (primary) hypertension: Secondary | ICD-10-CM | POA: Diagnosis present

## 2020-09-24 DIAGNOSIS — E119 Type 2 diabetes mellitus without complications: Secondary | ICD-10-CM | POA: Diagnosis present

## 2020-09-24 DIAGNOSIS — S129XXA Fracture of neck, unspecified, initial encounter: Secondary | ICD-10-CM | POA: Diagnosis present

## 2020-09-24 DIAGNOSIS — G4733 Obstructive sleep apnea (adult) (pediatric): Secondary | ICD-10-CM | POA: Diagnosis present

## 2020-09-24 DIAGNOSIS — Z79899 Other long term (current) drug therapy: Secondary | ICD-10-CM

## 2020-09-24 DIAGNOSIS — Z7982 Long term (current) use of aspirin: Secondary | ICD-10-CM

## 2020-09-24 DIAGNOSIS — K219 Gastro-esophageal reflux disease without esophagitis: Secondary | ICD-10-CM | POA: Diagnosis present

## 2020-09-24 DIAGNOSIS — Z8249 Family history of ischemic heart disease and other diseases of the circulatory system: Secondary | ICD-10-CM

## 2020-09-24 DIAGNOSIS — Z7984 Long term (current) use of oral hypoglycemic drugs: Secondary | ICD-10-CM

## 2020-09-24 DIAGNOSIS — M960 Pseudarthrosis after fusion or arthrodesis: Principal | ICD-10-CM | POA: Diagnosis present

## 2020-09-24 DIAGNOSIS — Z833 Family history of diabetes mellitus: Secondary | ICD-10-CM

## 2020-09-24 DIAGNOSIS — F32A Depression, unspecified: Secondary | ICD-10-CM | POA: Diagnosis present

## 2020-09-24 DIAGNOSIS — Z20822 Contact with and (suspected) exposure to covid-19: Secondary | ICD-10-CM | POA: Diagnosis present

## 2020-09-24 DIAGNOSIS — Z86711 Personal history of pulmonary embolism: Secondary | ICD-10-CM

## 2020-09-24 HISTORY — PX: POSTERIOR CERVICAL FUSION/FORAMINOTOMY: SHX5038

## 2020-09-24 LAB — GLUCOSE, CAPILLARY
Glucose-Capillary: 116 mg/dL — ABNORMAL HIGH (ref 70–99)
Glucose-Capillary: 111 mg/dL — ABNORMAL HIGH (ref 70–99)
Glucose-Capillary: 113 mg/dL — ABNORMAL HIGH (ref 70–99)
Glucose-Capillary: 122 mg/dL — ABNORMAL HIGH (ref 70–99)

## 2020-09-24 SURGERY — POSTERIOR CERVICAL FUSION/FORAMINOTOMY LEVEL 4
Anesthesia: General

## 2020-09-24 MED ORDER — MIDAZOLAM HCL 2 MG/2ML IJ SOLN
INTRAMUSCULAR | Status: DC | PRN
  Administered 2020-09-24 (×2): 1 mg via INTRAVENOUS

## 2020-09-24 MED ORDER — PANTOPRAZOLE SODIUM 40 MG IV SOLR: 40.0000 mg | Freq: Every day | INTRAVENOUS | Status: DC

## 2020-09-24 MED ORDER — FENTANYL CITRATE (PF) 250 MCG/5ML IJ SOLN
INTRAMUSCULAR | Status: AC
  Filled 2020-09-24: qty 5

## 2020-09-24 MED ORDER — THROMBIN 20000 UNITS EX SOLR
CUTANEOUS | Status: AC
  Filled 2020-09-24: qty 20000

## 2020-09-24 MED ORDER — ACETAMINOPHEN 325 MG PO TABS
650.0000 mg | ORAL_TABLET | ORAL | Status: DC | PRN
  Administered 2020-09-25: 650 mg via ORAL
  Filled 2020-09-24: qty 2

## 2020-09-24 MED ORDER — GABAPENTIN 600 MG PO TABS
600.0000 mg | ORAL_TABLET | Freq: Three times a day (TID) | ORAL | Status: DC
  Administered 2020-09-24 – 2020-09-25 (×4): 600 mg via ORAL
  Filled 2020-09-24 (×4): qty 1

## 2020-09-24 MED ORDER — DIPHENHYDRAMINE HCL 25 MG PO CAPS
25.0000 mg | ORAL_CAPSULE | Freq: Four times a day (QID) | ORAL | Status: DC | PRN
  Administered 2020-09-24: 25 mg via ORAL
  Filled 2020-09-24: qty 1

## 2020-09-24 MED ORDER — POTASSIUM CHLORIDE CRYS ER 20 MEQ PO TBCR
20.0000 meq | EXTENDED_RELEASE_TABLET | Freq: Every day | ORAL | Status: DC
  Administered 2020-09-25: 20 meq via ORAL
  Filled 2020-09-24: qty 1

## 2020-09-24 MED ORDER — METHOCARBAMOL 500 MG PO TABS
500.0000 mg | ORAL_TABLET | Freq: Four times a day (QID) | ORAL | Status: DC | PRN
  Administered 2020-09-24 – 2020-09-25 (×4): 500 mg via ORAL
  Filled 2020-09-24 (×4): qty 1

## 2020-09-24 MED ORDER — OXYCODONE-ACETAMINOPHEN 5-325 MG PO TABS
1.0000 | ORAL_TABLET | ORAL | Status: DC | PRN
  Administered 2020-09-24 – 2020-09-25 (×4): 2 via ORAL
  Filled 2020-09-24 (×4): qty 2

## 2020-09-24 MED ORDER — PROPOFOL 10 MG/ML IV BOLUS
INTRAVENOUS | Status: AC
  Filled 2020-09-24: qty 20

## 2020-09-24 MED ORDER — PRAVASTATIN SODIUM 10 MG PO TABS: 20.0000 mg | ORAL_TABLET | Freq: Every day | ORAL | Status: DC

## 2020-09-24 MED ORDER — ROCURONIUM BROMIDE 10 MG/ML (PF) SYRINGE
PREFILLED_SYRINGE | INTRAVENOUS | Status: AC
  Filled 2020-09-24: qty 10

## 2020-09-24 MED ORDER — NALOXONE HCL 4 MG/0.1ML NA LIQD: 1.0000 | NASAL | Status: DC

## 2020-09-24 MED ORDER — ALBUTEROL SULFATE HFA 108 (90 BASE) MCG/ACT IN AERS
1.0000 | INHALATION_SPRAY | RESPIRATORY_TRACT | Status: DC | PRN
  Filled 2020-09-24: qty 6.7

## 2020-09-24 MED ORDER — SPIRONOLACTONE 25 MG PO TABS
25.0000 mg | ORAL_TABLET | Freq: Every day | ORAL | Status: DC
  Administered 2020-09-25: 25 mg via ORAL
  Filled 2020-09-24: qty 1

## 2020-09-24 MED ORDER — SODIUM CHLORIDE 0.9% FLUSH: 3.0000 mL | INTRAVENOUS | Status: DC | PRN

## 2020-09-24 MED ORDER — ACETAMINOPHEN 500 MG PO TABS
1000.0000 mg | ORAL_TABLET | Freq: Once | ORAL | Status: AC
  Administered 2020-09-24: 1000 mg via ORAL
  Filled 2020-09-24: qty 2

## 2020-09-24 MED ORDER — BUPIVACAINE LIPOSOME 1.3 % IJ SUSP
INTRAMUSCULAR | Status: AC
  Filled 2020-09-24: qty 20

## 2020-09-24 MED ORDER — LABETALOL HCL 200 MG PO TABS
200.0000 mg | ORAL_TABLET | Freq: Two times a day (BID) | ORAL | Status: DC
  Administered 2020-09-24 – 2020-09-25 (×2): 200 mg via ORAL
  Filled 2020-09-24 (×3): qty 1

## 2020-09-24 MED ORDER — ALBUMIN HUMAN 5 % IV SOLN: INTRAVENOUS | Status: DC | PRN

## 2020-09-24 MED ORDER — PHENYLEPHRINE 40 MCG/ML (10ML) SYRINGE FOR IV PUSH (FOR BLOOD PRESSURE SUPPORT)
PREFILLED_SYRINGE | INTRAVENOUS | Status: DC | PRN
  Administered 2020-09-24: 120 ug via INTRAVENOUS
  Administered 2020-09-24: 80 ug via INTRAVENOUS

## 2020-09-24 MED ORDER — ONDANSETRON HCL 4 MG/2ML IJ SOLN
INTRAMUSCULAR | Status: DC | PRN
  Administered 2020-09-24: 4 mg via INTRAVENOUS

## 2020-09-24 MED ORDER — BUPIVACAINE-EPINEPHRINE (PF) 0.25% -1:200000 IJ SOLN
INTRAMUSCULAR | Status: AC
  Filled 2020-09-24: qty 30

## 2020-09-24 MED ORDER — LIDOCAINE 2% (20 MG/ML) 5 ML SYRINGE
INTRAMUSCULAR | Status: DC | PRN
  Administered 2020-09-24: 80 mg via INTRAVENOUS

## 2020-09-24 MED ORDER — SENNOSIDES-DOCUSATE SODIUM 8.6-50 MG PO TABS: 1.0000 | ORAL_TABLET | Freq: Every evening | ORAL | Status: DC | PRN

## 2020-09-24 MED ORDER — FENTANYL CITRATE (PF) 100 MCG/2ML IJ SOLN
INTRAMUSCULAR | Status: AC
  Administered 2020-09-24: 25 ug via INTRAVENOUS
  Filled 2020-09-24: qty 2

## 2020-09-24 MED ORDER — CHLORHEXIDINE GLUCONATE 0.12 % MT SOLN
15.0000 mL | Freq: Once | OROMUCOSAL | Status: AC
  Administered 2020-09-24: 15 mL via OROMUCOSAL
  Filled 2020-09-24: qty 15

## 2020-09-24 MED ORDER — ALUM & MAG HYDROXIDE-SIMETH 200-200-20 MG/5ML PO SUSP: 30.0000 mL | Freq: Four times a day (QID) | ORAL | Status: DC | PRN

## 2020-09-24 MED ORDER — FENTANYL CITRATE (PF) 100 MCG/2ML IJ SOLN
INTRAMUSCULAR | Status: AC
  Administered 2020-09-24: 50 ug via INTRAVENOUS
  Filled 2020-09-24: qty 2

## 2020-09-24 MED ORDER — HYDROCODONE-ACETAMINOPHEN 5-325 MG PO TABS
1.0000 | ORAL_TABLET | ORAL | Status: DC | PRN
  Administered 2020-09-24: 2 via ORAL
  Filled 2020-09-24: qty 2

## 2020-09-24 MED ORDER — LACTATED RINGERS IV SOLN: INTRAVENOUS | Status: DC

## 2020-09-24 MED ORDER — FENTANYL CITRATE (PF) 100 MCG/2ML IJ SOLN
25.0000 ug | INTRAMUSCULAR | Status: DC | PRN
  Administered 2020-09-24: 25 ug via INTRAVENOUS

## 2020-09-24 MED ORDER — PROPOFOL 10 MG/ML IV BOLUS
INTRAVENOUS | Status: DC | PRN
  Administered 2020-09-24: 160 mg via INTRAVENOUS

## 2020-09-24 MED ORDER — MORPHINE SULFATE (PF) 2 MG/ML IV SOLN
1.0000 mg | INTRAVENOUS | Status: DC | PRN
  Administered 2020-09-24 (×2): 2 mg via INTRAVENOUS
  Filled 2020-09-24 (×2): qty 1

## 2020-09-24 MED ORDER — ACETAMINOPHEN 650 MG RE SUPP: 650.0000 mg | RECTAL | Status: DC | PRN

## 2020-09-24 MED ORDER — PANTOPRAZOLE SODIUM 40 MG PO TBEC
80.0000 mg | DELAYED_RELEASE_TABLET | Freq: Every day | ORAL | Status: DC
  Administered 2020-09-25: 80 mg via ORAL
  Filled 2020-09-24: qty 2

## 2020-09-24 MED ORDER — CEFAZOLIN SODIUM-DEXTROSE 2-4 GM/100ML-% IV SOLN
2.0000 g | INTRAVENOUS | Status: AC
  Administered 2020-09-24: 2 g via INTRAVENOUS
  Filled 2020-09-24: qty 100

## 2020-09-24 MED ORDER — FERROUS SULFATE 325 (65 FE) MG PO TABS
325.0000 mg | ORAL_TABLET | Freq: Every day | ORAL | Status: DC
  Administered 2020-09-25: 325 mg via ORAL
  Filled 2020-09-24: qty 1

## 2020-09-24 MED ORDER — MIDAZOLAM HCL 2 MG/2ML IJ SOLN
INTRAMUSCULAR | Status: AC
  Filled 2020-09-24: qty 2

## 2020-09-24 MED ORDER — LISINOPRIL 2.5 MG PO TABS
2.5000 mg | ORAL_TABLET | Freq: Every day | ORAL | Status: DC
  Administered 2020-09-25: 2.5 mg via ORAL
  Filled 2020-09-24: qty 1

## 2020-09-24 MED ORDER — CEFAZOLIN SODIUM-DEXTROSE 2-4 GM/100ML-% IV SOLN
2.0000 g | Freq: Three times a day (TID) | INTRAVENOUS | Status: AC
  Administered 2020-09-24 (×2): 2 g via INTRAVENOUS
  Filled 2020-09-24 (×2): qty 100

## 2020-09-24 MED ORDER — NABUMETONE 500 MG PO TABS
750.0000 mg | ORAL_TABLET | Freq: Two times a day (BID) | ORAL | Status: DC
  Administered 2020-09-25: 750 mg via ORAL
  Filled 2020-09-24 (×3): qty 2

## 2020-09-24 MED ORDER — ORAL CARE MOUTH RINSE: 15.0000 mL | Freq: Once | OROMUCOSAL | Status: AC

## 2020-09-24 MED ORDER — POVIDONE-IODINE 7.5 % EX SOLN
Freq: Once | CUTANEOUS | Status: DC
  Filled 2020-09-24: qty 118

## 2020-09-24 MED ORDER — CYCLOBENZAPRINE HCL 10 MG PO TABS
20.0000 mg | ORAL_TABLET | Freq: Every day | ORAL | Status: DC
  Administered 2020-09-24: 20 mg via ORAL
  Filled 2020-09-24: qty 2

## 2020-09-24 MED ORDER — PHENOL 1.4 % MT LIQD: 1.0000 | OROMUCOSAL | Status: DC | PRN

## 2020-09-24 MED ORDER — BACITRACIN ZINC 500 UNIT/GM EX OINT
TOPICAL_OINTMENT | CUTANEOUS | Status: AC
  Filled 2020-09-24: qty 28.35

## 2020-09-24 MED ORDER — TRIAMTERENE-HCTZ 75-50 MG PO TABS
1.0000 | ORAL_TABLET | Freq: Every day | ORAL | Status: DC
  Administered 2020-09-25: 1 via ORAL
  Filled 2020-09-24: qty 1

## 2020-09-24 MED ORDER — FENTANYL CITRATE (PF) 100 MCG/2ML IJ SOLN
INTRAMUSCULAR | Status: DC | PRN
  Administered 2020-09-24: 50 ug via INTRAVENOUS
  Administered 2020-09-24: 25 ug via INTRAVENOUS
  Administered 2020-09-24 (×2): 50 ug via INTRAVENOUS

## 2020-09-24 MED ORDER — ONDANSETRON HCL 4 MG/2ML IJ SOLN
INTRAMUSCULAR | Status: AC
  Filled 2020-09-24: qty 2

## 2020-09-24 MED ORDER — SODIUM CHLORIDE 0.9 % IV SOLN: 250.0000 mL | INTRAVENOUS | Status: DC

## 2020-09-24 MED ORDER — METHOCARBAMOL 1000 MG/10ML IJ SOLN
500.0000 mg | Freq: Four times a day (QID) | INTRAVENOUS | Status: DC | PRN
  Filled 2020-09-24: qty 5

## 2020-09-24 MED ORDER — SUGAMMADEX SODIUM 200 MG/2ML IV SOLN
INTRAVENOUS | Status: DC | PRN
  Administered 2020-09-24: 200 mg via INTRAVENOUS

## 2020-09-24 MED ORDER — MENTHOL 3 MG MT LOZG: 1.0000 | LOZENGE | OROMUCOSAL | Status: DC | PRN

## 2020-09-24 MED ORDER — LIDOCAINE 2% (20 MG/ML) 5 ML SYRINGE
INTRAMUSCULAR | Status: AC
  Filled 2020-09-24: qty 5

## 2020-09-24 MED ORDER — LORATADINE 10 MG PO TABS
10.0000 mg | ORAL_TABLET | Freq: Every day | ORAL | Status: DC
  Administered 2020-09-25: 10 mg via ORAL
  Filled 2020-09-24: qty 1

## 2020-09-24 MED ORDER — PHENYLEPHRINE 40 MCG/ML (10ML) SYRINGE FOR IV PUSH (FOR BLOOD PRESSURE SUPPORT)
PREFILLED_SYRINGE | INTRAVENOUS | Status: AC
  Filled 2020-09-24: qty 20

## 2020-09-24 MED ORDER — ESCITALOPRAM OXALATE 20 MG PO TABS
40.0000 mg | ORAL_TABLET | Freq: Every day | ORAL | Status: DC
  Administered 2020-09-25: 40 mg via ORAL
  Filled 2020-09-24: qty 2

## 2020-09-24 MED ORDER — METFORMIN HCL 500 MG PO TABS
500.0000 mg | ORAL_TABLET | Freq: Every day | ORAL | Status: DC
  Administered 2020-09-25: 500 mg via ORAL
  Filled 2020-09-24: qty 1

## 2020-09-24 MED ORDER — VITAMIN D (ERGOCALCIFEROL) 1.25 MG (50000 UNIT) PO CAPS: 50000.0000 [IU] | ORAL_CAPSULE | ORAL | Status: DC

## 2020-09-24 MED ORDER — PHENYLEPHRINE HCL-NACL 10-0.9 MG/250ML-% IV SOLN
INTRAVENOUS | Status: DC | PRN
  Administered 2020-09-24: 90 ug/min via INTRAVENOUS

## 2020-09-24 MED ORDER — BUPIVACAINE LIPOSOME 1.3 % IJ SUSP
INTRAMUSCULAR | Status: DC | PRN
  Administered 2020-09-24: 20 mL

## 2020-09-24 MED ORDER — ZOLPIDEM TARTRATE 5 MG PO TABS
5.0000 mg | ORAL_TABLET | Freq: Every evening | ORAL | Status: DC | PRN
Start: 2020-09-24 — End: 2020-09-25

## 2020-09-24 MED ORDER — BISACODYL 5 MG PO TBEC: 5.0000 mg | DELAYED_RELEASE_TABLET | Freq: Every day | ORAL | Status: DC | PRN

## 2020-09-24 MED ORDER — MOMETASONE FURO-FORMOTEROL FUM 200-5 MCG/ACT IN AERO
2.0000 | INHALATION_SPRAY | Freq: Two times a day (BID) | RESPIRATORY_TRACT | Status: DC
  Filled 2020-09-24: qty 8.8

## 2020-09-24 MED ORDER — DOCUSATE SODIUM 100 MG PO CAPS
100.0000 mg | ORAL_CAPSULE | Freq: Two times a day (BID) | ORAL | Status: DC
  Administered 2020-09-24 – 2020-09-25 (×2): 100 mg via ORAL
  Filled 2020-09-24 (×2): qty 1

## 2020-09-24 MED ORDER — ROCURONIUM BROMIDE 10 MG/ML (PF) SYRINGE
PREFILLED_SYRINGE | INTRAVENOUS | Status: DC | PRN
  Administered 2020-09-24: 100 mg via INTRAVENOUS
  Administered 2020-09-24: 20 mg via INTRAVENOUS
  Administered 2020-09-24: 10 mg via INTRAVENOUS

## 2020-09-24 MED ORDER — FLEET ENEMA 7-19 GM/118ML RE ENEM: 1.0000 | ENEMA | Freq: Once | RECTAL | Status: DC | PRN

## 2020-09-24 MED ORDER — SODIUM CHLORIDE 0.9% FLUSH
3.0000 mL | Freq: Two times a day (BID) | INTRAVENOUS | Status: DC
  Administered 2020-09-24 (×2): 3 mL via INTRAVENOUS

## 2020-09-24 MED ORDER — LACTATED RINGERS IV SOLN: INTRAVENOUS | Status: DC | PRN

## 2020-09-24 MED ORDER — FLUTICASONE PROPIONATE 50 MCG/ACT NA SUSP: 2.0000 | Freq: Every day | NASAL | Status: DC

## 2020-09-24 SURGICAL SUPPLY — 82 items
BENZOIN TINCTURE PRP APPL 2/3 (GAUZE/BANDAGES/DRESSINGS) ×2 IMPLANT
BIT DRILL 2.4 F/3.5 SCREW (BIT) ×2 IMPLANT
BLADE CLIPPER SURG NEURO (BLADE) IMPLANT
BONE VIVIGEN FORMABLE 5.4CC (Bone Implant) ×2 IMPLANT
BUR MATCHSTICK NEURO 3.0 LAGG (BURR) ×2 IMPLANT
BUR PRESCISION 1.7 ELITE (BURR) ×2 IMPLANT
CLSR STERI-STRIP ANTIMIC 1/2X4 (GAUZE/BANDAGES/DRESSINGS) ×2 IMPLANT
CNTNR URN SCR LID CUP LEK RST (MISCELLANEOUS) ×1 IMPLANT
CONT SPEC 4OZ STRL OR WHT (MISCELLANEOUS) ×2
CORD BIPOLAR FORCEPS 12FT (ELECTRODE) ×2 IMPLANT
COVER BACK TABLE 80X110 HD (DRAPES) ×2 IMPLANT
COVER SURGICAL LIGHT HANDLE (MISCELLANEOUS) ×2 IMPLANT
COVER WAND RF STERILE (DRAPES) ×2 IMPLANT
DEFOGGER ANTIFOG KIT (MISCELLANEOUS) ×2 IMPLANT
DRAIN CHANNEL 10F 3/8 F FF (DRAIN) IMPLANT
DRAIN CHANNEL 15F RND FF W/TCR (WOUND CARE) IMPLANT
DRAIN HEMOVAC 1/8 X 5 (WOUND CARE) IMPLANT
DRAPE C-ARM 42X72 X-RAY (DRAPES) IMPLANT
DRAPE HALF SHEET 40X57 (DRAPES) ×10 IMPLANT
DRAPE INCISE IOBAN 66X45 STRL (DRAPES) ×2 IMPLANT
DRAPE LAPAROTOMY 100X72 PEDS (DRAPES) ×2 IMPLANT
DRAPE POUCH INSTRU U-SHP 10X18 (DRAPES) ×2 IMPLANT
DRAPE SURG 17X23 STRL (DRAPES) ×16 IMPLANT
DRSG MEPILEX BORDER 4X8 (GAUZE/BANDAGES/DRESSINGS) ×2 IMPLANT
DURAPREP 26ML APPLICATOR (WOUND CARE) ×2 IMPLANT
ELECT BLADE 4.0 EZ CLEAN MEGAD (MISCELLANEOUS) ×2
ELECT CAUTERY BLADE 6.4 (BLADE) ×2 IMPLANT
ELECT REM PT RETURN 9FT ADLT (ELECTROSURGICAL) ×2
ELECTRODE BLDE 4.0 EZ CLN MEGD (MISCELLANEOUS) ×1 IMPLANT
ELECTRODE REM PT RTRN 9FT ADLT (ELECTROSURGICAL) ×1 IMPLANT
EVACUATOR SILICONE 100CC (DRAIN) IMPLANT
GAUZE 4X4 16PLY RFD (DISPOSABLE) ×8 IMPLANT
GAUZE SPONGE 4X4 12PLY STRL (GAUZE/BANDAGES/DRESSINGS) ×2 IMPLANT
GAUZE SPONGE 4X4 12PLY STRL LF (GAUZE/BANDAGES/DRESSINGS) ×2 IMPLANT
GLOVE BIO SURGEON STRL SZ7 (GLOVE) ×2 IMPLANT
GLOVE BIO SURGEON STRL SZ8 (GLOVE) ×2 IMPLANT
GLOVE BIOGEL PI IND STRL 7.5 (GLOVE) ×1 IMPLANT
GLOVE BIOGEL PI INDICATOR 7.5 (GLOVE) ×1
GLOVE SRG 8 PF TXTR STRL LF DI (GLOVE) ×1 IMPLANT
GLOVE SURG POLYISO LF SZ6.5 (GLOVE) ×2 IMPLANT
GLOVE SURG UNDER POLY LF SZ8 (GLOVE) ×2
GOWN STRL REUS W/ TWL LRG LVL3 (GOWN DISPOSABLE) ×4 IMPLANT
GOWN STRL REUS W/ TWL XL LVL3 (GOWN DISPOSABLE) ×1 IMPLANT
GOWN STRL REUS W/TWL LRG LVL3 (GOWN DISPOSABLE) ×8
GOWN STRL REUS W/TWL XL LVL3 (GOWN DISPOSABLE) ×2
IV CATH 14GX2 1/4 (CATHETERS) ×2 IMPLANT
KIT BASIN OR (CUSTOM PROCEDURE TRAY) ×2 IMPLANT
KIT TURNOVER KIT B (KITS) ×2 IMPLANT
NEEDLE HYPO 25GX1X1/2 BEV (NEEDLE) ×2 IMPLANT
NEEDLE PRECISIONGLIDE 27X1.5 (NEEDLE) ×2 IMPLANT
NS IRRIG 1000ML POUR BTL (IV SOLUTION) ×2 IMPLANT
PACK LAMINECTOMY ORTHO (CUSTOM PROCEDURE TRAY) ×2 IMPLANT
PACK UNIVERSAL I (CUSTOM PROCEDURE TRAY) ×2 IMPLANT
PAD ARMBOARD 7.5X6 YLW CONV (MISCELLANEOUS) ×4 IMPLANT
PATTIES SURGICAL .5 X.5 (GAUZE/BANDAGES/DRESSINGS) ×2 IMPLANT
PIN MAYFIELD SKULL DISP (PIN) ×2 IMPLANT
ROD LORD SYMP 4.0X055 (Rod) ×4 IMPLANT
SCREW 4.0 PLY 3.5X14 (Screw) ×8 IMPLANT
SCREW POLY 4X22 (Screw) ×4 IMPLANT
SCREW SET SPINAL (Screw) ×12 IMPLANT
SPONGE INTESTINAL PEANUT (DISPOSABLE) IMPLANT
SPONGE SURGIFOAM ABS GEL 100 (HEMOSTASIS) ×2 IMPLANT
STRIP CLOSURE SKIN 1/2X4 (GAUZE/BANDAGES/DRESSINGS) IMPLANT
SURGIFLO W/THROMBIN 8M KIT (HEMOSTASIS) IMPLANT
SUT BONE WAX W31G (SUTURE) ×2 IMPLANT
SUT MNCRL AB 4-0 PS2 18 (SUTURE) ×2 IMPLANT
SUT VIC AB 0 CT1 18XCR BRD 8 (SUTURE) ×1 IMPLANT
SUT VIC AB 0 CT1 8-18 (SUTURE) ×2
SUT VIC AB 1 CT1 18XCR BRD 8 (SUTURE) ×2 IMPLANT
SUT VIC AB 1 CT1 8-18 (SUTURE) ×4
SUT VIC AB 2-0 CT2 18 VCP726D (SUTURE) ×6 IMPLANT
SYR BULB IRRIG 60ML STRL (SYRINGE) ×2 IMPLANT
SYR CONTROL 10ML LL (SYRINGE) ×2 IMPLANT
TAP MOUNTAINEER 3MM (TAP) ×2 IMPLANT
TAP SPINAL 3.5 (ORTHOPEDIC DISPOSABLE SUPPLIES) ×2 IMPLANT
TAP SURG 3.5 MOUNTAINEER (BIT) ×2 IMPLANT
TAPE CLOTH 4X10 WHT NS (GAUZE/BANDAGES/DRESSINGS) ×2 IMPLANT
TOWEL GREEN STERILE (TOWEL DISPOSABLE) ×2 IMPLANT
TOWEL GREEN STERILE FF (TOWEL DISPOSABLE) ×2 IMPLANT
TRAY FOLEY MTR SLVR 16FR STAT (SET/KITS/TRAYS/PACK) ×2 IMPLANT
WATER STERILE IRR 1000ML POUR (IV SOLUTION) ×2 IMPLANT
YANKAUER SUCT BULB TIP NO VENT (SUCTIONS) ×2 IMPLANT

## 2020-09-24 NOTE — Anesthesia Postprocedure Evaluation (Signed)
Anesthesia Post Note  Patient: AYVA VEILLEUX  Procedure(s) Performed: POSTERIOR CERVICAL DECOMPRESSION FUSION CERVICAL 3 - CERVICAL 7 WITH INSTRUMENTATION AND ALLOGRAFT (N/A )     Patient location during evaluation: PACU Anesthesia Type: General Level of consciousness: awake and alert Pain management: pain level controlled Vital Signs Assessment: post-procedure vital signs reviewed and stable Respiratory status: spontaneous breathing, nonlabored ventilation, respiratory function stable and patient connected to nasal cannula oxygen Cardiovascular status: blood pressure returned to baseline and stable Postop Assessment: no apparent nausea or vomiting Anesthetic complications: no   No complications documented.  Last Vitals:  Vitals:   09/24/20 1412 09/24/20 1629  BP: 121/74 124/77  Pulse: 73 77  Resp: 18 18  Temp: (!) 36.4 C 36.8 C  SpO2: 98% 92%    Last Pain:  Vitals:   09/24/20 1629  TempSrc: Oral  PainSc:                  Carine Nordgren L Kore Madlock

## 2020-09-24 NOTE — Anesthesia Procedure Notes (Signed)
Procedure Name: Intubation Date/Time: 09/24/2020 7:46 AM Performed by: Asher Muir, CRNA Pre-anesthesia Checklist: Patient identified, Emergency Drugs available, Suction available and Patient being monitored Patient Re-evaluated:Patient Re-evaluated prior to induction Oxygen Delivery Method: Circle system utilized and Simple face mask Preoxygenation: Pre-oxygenation with 100% oxygen Induction Type: IV induction and Combination inhalational/ intravenous induction Ventilation: Mask ventilation without difficulty and Oral airway inserted - appropriate to patient size Laryngoscope Size: Mac, 3 and Glidescope Grade View: Grade I Tube type: Oral Tube size: 7.0 mm Number of attempts: 1 Airway Equipment and Method: Stylet and Video-laryngoscopy Placement Confirmation: ETT inserted through vocal cords under direct vision,  positive ETCO2 and breath sounds checked- equal and bilateral Secured at: 22 (right lip) cm Tube secured with: Tape Dental Injury: Teeth and Oropharynx as per pre-operative assessment

## 2020-09-24 NOTE — Transfer of Care (Signed)
Immediate Anesthesia Transfer of Care Note  Patient: [name]  Procedure(s) Performed: POSTERIOR CERVICAL DECOMPRESSION FUSION CERVICAL 3 - CERVICAL 7 WITH INSTRUMENTATION AND ALLOGRAFT (N/A )  Patient Location: PACU  Anesthesia Type:General  Level of Consciousness: awake, alert  and oriented  Airway & Oxygen Therapy: Patient Spontanous Breathing and Patient connected to face mask oxygen  Post-op Assessment: Report given to RN and Post -op Vital signs reviewed and stable  Post vital signs: Reviewed and stable  Last Vitals:  Vitals Value Taken Time  BP 116/66 09/24/20 1147  Temp    Pulse 76 09/24/20 1147  Resp 15 09/24/20 1147  SpO2 98 % 09/24/20 1147  Vitals shown include unvalidated device data.  Last Pain:  Vitals:   09/24/20 0632  TempSrc:   PainSc: 7       Patients Stated Pain Goal: 5 (30/85/69 4370)  Complications: No complications documented.

## 2020-09-24 NOTE — Progress Notes (Signed)
Orthopedic Tech Progress Note Patient Details:  [name] May 06, 1965 [id number] Dropped aspen cervical collar off to OR Patient ID: [name], female   DOB: Dec 19, 1965, 55 y.o.   MRN: [id number]   [name] 09/24/2020, 10:14 AM

## 2020-09-24 NOTE — H&P (Signed)
PREOPERATIVE H&P  Chief Complaint: Neck pain  HPI: [name] is a 55 y.o. female who presents with ongoing pain in the neck s/p previous attempted cervical fusions  CT reveals nonunions spanning C3-C7  Patient has failed multiple forms of conservative care and continues to have pain (see office notes for additional details regarding the patient's full course of treatment)  Past Medical History:  Diagnosis Date  . Anemia   . Anxiety   . Arthritis    osteoarthritis  . Atypical chest pain    negative Myoview in 2011 with no ischemi and normal LV function.   . Benign neoplasm of colon   . Depression   . Diabetes mellitus without complication ([place])   . DVT (deep venous thrombosis) ([place])   . Dyspnea   . GERD (gastroesophageal reflux disease)    uses Nexium  . Heart murmur   . History of hiatal hernia   . HTN (hypertension)   . Long Q-T syndrome   . Obesity   . OSA (obstructive sleep apnea)    uses C-PAP  . PE (pulmonary embolism)   . Peripheral vascular disease ([place])    blood clot in right leg  . Pneumonia   . Prolonged QT interval    gene positive  . Seizures ([place])    hasn't had any for 25 years   Past Surgical History:  Procedure Laterality Date  . ANTERIOR CERVICAL DECOMP/DISCECTOMY FUSION N/A 06/25/2013   Procedure: ANTERIOR CERVICAL DECOMPRESSION/DISCECTOMY FUSION 2 LEVELS LEVELS C FOUR/FIVE, SIX/SEVEN;  Surgeon: Floyce Stakes, MD;  Location: [facility] NEURO [facility]S;  Service: Neurosurgery;  Laterality: N/A;  . ANTERIOR CERVICAL DECOMP/DISCECTOMY FUSION N/A 03/29/2016   Procedure: CERVICAL THREE-FOUR, CERVICAL FOUR-FIVE ANTERIOR CERVICAL DECOMPRESSION/DISCECTOMY/FUSION WITH REMOVAL OF PLATES AT H2-9 J2-4;  Surgeon: Leeroy Cha, MD;  Location: [place];  Service: Neurosurgery;  Laterality: N/A;  C3-4 C5-6 ANTERIOR CERVICAL DECOMPRESSION/DISCECTOMY/FUSION WITH POSSIBLE REMOVAL OF PLATE AT Q6-8 T4-1  . BACK SURGERY    . CARDIAC CATHETERIZATION    . COLONOSCOPY     . COLONOSCOPY WITH PROPOFOL N/A 01/16/2018   Procedure: COLONOSCOPY WITH PROPOFOL;  Surgeon: Lollie Sails, MD;  Location: [facility] ENDOSCOPY;  Service: Endoscopy;  Laterality: N/A;  . DILATION AND CURETTAGE OF UTERUS    . ESOPHAGOGASTRODUODENOSCOPY (EGD) WITH PROPOFOL N/A 11/17/2015   Procedure: ESOPHAGOGASTRODUODENOSCOPY (EGD) WITH PROPOFOL;  Surgeon: Lollie Sails, MD;  Location: [facility] ENDOSCOPY;  Service: Endoscopy;  Laterality: N/A;  . ESOPHAGOGASTRODUODENOSCOPY (EGD) WITH PROPOFOL N/A 01/16/2018   Procedure: ESOPHAGOGASTRODUODENOSCOPY (EGD) WITH PROPOFOL;  Surgeon: Lollie Sails, MD;  Location: [facility] ENDOSCOPY;  Service: Endoscopy;  Laterality: N/A;  . JOINT REPLACEMENT  04/2006   hip  . left foot surgery    . left hand surgery x 2    . LUMBAR DISC SURGERY    . right hand surgery for torn ligament    . TOTAL HIP ARTHROPLASTY     Social History   Socioeconomic History  . Marital status: Single    Spouse name: Not on file  . Number of children: Not on file  . Years of education: Not on file  . Highest education level: Not on file  Occupational History  . Occupation: disabled  Tobacco Use  . Smoking status: Former Smoker    Quit date: 08/16/2001    Years since quitting: 19.1  . Smokeless tobacco: Never Used  Vaping Use  . Vaping Use: Never used  Substance and Sexual Activity  . Alcohol  use: Yes    Alcohol/week: 2.0 standard drinks    Types: 2 Standard drinks or equivalent per week    Comment: very rare  . Drug use: Not Currently    Comment: cocaine remission abstinent since 01/2008  . Sexual activity: Never  Other Topics Concern  . Not on file  Social History Narrative  . Not on file   Social Determinants of Health   Financial Resource Strain: Not on file  Food Insecurity: Not on file  Transportation Needs: Not on file  Physical Activity: Not on file  Stress: Not on file  Social Connections: Not on file   Family History  Problem Relation Age of Onset  .  Long QT syndrome Mother   . CAD Mother   . Diabetes Father   . CAD Father   . Diabetes Brother   . Breast cancer Maternal Aunt 70  . Breast cancer Paternal Aunt   . Breast cancer Maternal Aunt 40   No Known Allergies Prior to Admission medications   Medication Sig Start Date End Date Taking? Authorizing Provider  albuterol (PROVENTIL HFA;VENTOLIN HFA) 108 (90 BASE) MCG/ACT inhaler Inhale 1-2 puffs into the lungs every 4 (four) hours as needed for wheezing or shortness of breath.   Yes [provider]  aspirin EC 81 MG tablet Take 81 mg by mouth daily. Swallow whole.   Yes [provider]  budesonide-formoterol (SYMBICORT) 160-4.5 MCG/ACT inhaler Inhale 2 puffs into the lungs 2 (two) times daily as needed (for shortness of breath).   Yes [provider]  cyclobenzaprine (FLEXERIL) 10 MG tablet Take 2 tablets (20 mg total) by mouth at bedtime. 06/24/20  Yes Elsie Stain, MD  escitalopram (LEXAPRO) 20 MG tablet Take 1 tablet (20 mg total) by mouth daily. Patient taking differently: Take 40 mg by mouth daily. 07/06/20  Yes Elsie Stain, MD  ferrous sulfate 325 (65 FE) MG tablet Take 325 mg by mouth daily with breakfast.   Yes [provider]  fluticasone (FLONASE) 50 MCG/ACT nasal spray Place 2 sprays into both nostrils daily.   Yes [provider]  gabapentin (NEURONTIN) 600 MG tablet Take 1 tablet (600 mg total) by mouth 3 (three) times daily with meals. 06/24/20  Yes Elsie Stain, MD  Glucosamine-Chondroit-Vit C-Mn (GLUCOSAMINE 1500 COMPLEX) CAPS Take 1 capsule by mouth daily.   Yes [provider]  ibuprofen (ADVIL) 800 MG tablet Take 800 mg by mouth 3 (three) times daily. 11/11/19  Yes [provider]  labetalol (NORMODYNE) 200 MG tablet Take 1 tablet (200 mg total) by mouth 2 (two) times daily. Please keep upcoming appt with Dr. Caryl Comes in January before anymore refills. Thank you 07/06/20  Yes Elsie Stain, MD   lisinopril (ZESTRIL) 2.5 MG tablet Take 2.5 mg by mouth daily. 12/22/19  Yes [provider]  loratadine (CLARITIN) 10 MG tablet Take 1 tablet (10 mg total) by mouth daily. 07/07/20  Yes Elsie Stain, MD  metFORMIN (GLUCOPHAGE) 500 MG tablet Take 500 mg by mouth daily. 12/22/19  Yes [provider]  Multiple Vitamin (MULTIVITAMIN WITH MINERALS) TABS tablet Take 1 tablet by mouth daily. 07/07/20  Yes Elsie Stain, MD  nabumetone (RELAFEN) 750 MG tablet Take 750 mg by mouth 2 (two) times daily.   Yes [provider]  NARCAN 4 MG/0.1ML LIQD nasal spray kit Place 1 spray into the nose See admin instructions. As needed for opioid overdose. 08/29/19  Yes [provider]  Omega-3 Fatty Acids (FISH OIL) 1000 MG CAPS Take 1,000 mg by mouth daily.   Yes [provider]  omeprazole (PRILOSEC) 40 MG capsule Take 40 mg by mouth daily. 10/06/19  Yes [provider]  OZEMPIC, 1 MG/DOSE, 4 MG/3ML SOPN Inject 1 mg into the skin every Saturday. 12/16/19  Yes [provider]  potassium chloride SA (KLOR-CON M20) 20 MEQ tablet One daily Patient taking differently: Take 20 mEq by mouth daily. 06/24/20  Yes Elsie Stain, MD  pravastatin (PRAVACHOL) 20 MG tablet Take 20 mg by mouth daily. 12/22/19  Yes [provider]  spironolactone (ALDACTONE) 25 MG tablet Take 25 mg by mouth daily. 03/22/20  Yes [provider]  triamterene-hydrochlorothiazide (MAXZIDE) 75-50 MG tablet Take 1 tablet by mouth daily. 03/23/20  Yes [provider]  Vitamin D, Ergocalciferol, (DRISDOL) 1.25 MG (50000 UNIT) CAPS capsule Take 50,000 Units by mouth every Saturday. 11/10/19  Yes [provider]  Accu-Chek Softclix Lancets lancets Use as instructed 06/24/20   Elsie Stain, MD     All other systems have been reviewed and were otherwise negative with the exception of those mentioned in the HPI and as above.  Physical Exam: Vitals:    09/24/20 0606  BP: 132/83  Pulse: 86  Resp: 18  Temp: 98.1 F (36.7 C)  SpO2: 96%    Body mass index is 43.43 kg/m.  General: Alert, no acute distress Cardiovascular: No pedal edema Respiratory: No cyanosis, no use of accessory musculature Skin: No lesions in the area of chief complaint Neurologic: Sensation intact distally Psychiatric: Patient is competent for consent with normal mood and affect Lymphatic: No axillary or cervical lymphadenopathy   Assessment/Plan: SYMPTOMATIC NONUNIONS SPANNING CERVICAL 3- CERVICAL 7 Plan for Procedure(s): POSTERIOR CERVICAL DECOMPRESSION FUSION CERVICAL 3 - CERVICAL 7 WITH INSTRUMENTATION AND ALLOGRAFT   Norva Karvonen, MD 09/24/2020 6:44 AM

## 2020-09-24 NOTE — Op Note (Signed)
PATIENT NAME: [name] RECORD NO.:   [id number]    DATE OF BIRTH: 11-18-1965    DATE OF PROCEDURE: 09/24/2020   OPERATIVE REPORT   PREOPERATIVE DIAGNOSES: 1.  Status post previous C3-C7 anterior cervical diskectomy.  2.  Nonunions spanning C3-C7 3.  Severe neck pain.  POSTOPERATIVE DIAGNOSES: 1.  Status post previous C3-C7 anterior cervical diskectomy.  2.  Nonunions spanning C3-C7 3.  Severe neck pain.  PROCEDURE: 1.  Posterior spinal fusion C3-C4, C4-C5, C5-C6. 2.  Placement of posterior segmental instrumentation C3-C7. 3.  Bilateral C6-7 partial facetectomy and laminotomy 4.  Use of local autograft. 5.  Use of morselized allograft - Vivigen 6.  Cranial tong application and removal. 7.  Intraoperative use of fluoroscopy.  SURGEON:  Phylliss Bob, MD  ASSISTANT:  Pricilla Holm, PA-C   ANESTHESIA:  General endotracheal anesthesia.  COMPLICATIONS:  None.  DISPOSITION:  Stable.  ESTIMATED BLOOD LOSS:  Minimal.  INDICATIONS FOR SURGERY:  Briefly, [name] is a very pleasant 55 year old female who is status post multiple anterior cervical surgeries, spanning C3-C7.  She has been having ongoing neck pain.  A CAT scan did reveal obvious nonunions, spanning C3-C7, with failure of her hardware. Given her ongoing pain and nonunions, we did discuss proceeding with the procedure reflected above.  The patient was fully aware of the risks and limitations of surgery and did wish to proceed.  OPERATIVE DETAILS:  On 09/24/2020, the patient was brought to surgery and general endotracheal anesthesia was administered.  A [place] head holder was applied, and the patient was then rolled prone, and the [place] headholder was secured to the bed with the head positioned into the appropriate degree of lordosis.  The patient's arms were secured to her sides, and all bony prominences were padded.  The neck was then prepped and draped posteriorly in the usual fashion.  A timeout  procedure was performed.  At this point, I did make a midline incision posteriorly.  The fascia was incised at the midline.  The paraspinal musculature was bluntly swept laterally, and the posterior elements of C3, C4, C5, C6, and C7 were identified and subperiosteally exposed.  The facet joints to be fused were subperiosteally exposed as well.  I did use a 1.7 mm high-speed burr to decorticate the facet joints at C3-C4, C4-C5, C5-C6, and C6-7 bilaterally.  A 3 mm bur was used to decorticate the posterior elements across these levels as well.  Using anatomic landmarks, I did use a 1.7 mm bur to prepare the entry point of the lateral mass screws at C3 and C5 bilaterally.  A 3 mm tap was used, and 3.5 x 14 mm screws were secured into the lateral masses of C3 and C5 bilaterally.  At this point, using a 3 mm bur and a series of Kerrison punches, I did perform laminotomies bilaterally at C6-7.  This did allow me to thoroughly decompress the foramena bilaterally at C6-7.  I then was able to use a nerve hook to palpate the superior and medial aspect of the bilateral C7 pedicles.  Using these anatomic landmarks, I did use a 1.7 mm bur, followed by a gearshift probe, followed by a 3.5 mm tap, to cannulate the bilateral C7 pedicles.  3.5 x 22 mm screws were advanced into the C7 pedicles bilaterally. Vivigen was then placed along the posterior elements and facet joints to be fused (C3-C7).  Autograft taken from the spinous processes was also packed posteriorly.  [place]  mm rods were then contoured into the appropriate degree of lordosis and secured into the t[place]ip heads of the screws bilaterally, spanning C3-C7.  Caps were then placed, and a final locking procedure was performed.  Prior to placing the bone graft, I did liberally irrigate the wound with a total of approximately 2 L of normal saline.   I was very happy with the final lateral intraoperative radiograph.  At this point, the wound was closed in layers using #1 Vicryl,  followed by 2-0 Vicryl, followed by 4-0 Monocryl.  Benzoin and Steri-Strips were applied, followed by sterile dressing.  The patient was then rolled supine onto a stretcher, and the [place] head holder was removed.  All instrument counts were correct at the termination of the procedure.  Of note, Pricilla Holm was my assistant throughout surgery and did aid in retraction, suctioning, placement of the hardware, and closure from start to finish.   Phylliss Bob, MD

## 2020-09-25 ENCOUNTER — Ambulatory Visit: Payer: Medicare HMO | Admitting: Internal Medicine

## 2020-09-25 DIAGNOSIS — Z6841 Body Mass Index (BMI) 40.0 and over, adult: Secondary | ICD-10-CM | POA: Diagnosis not present

## 2020-09-25 DIAGNOSIS — Z8249 Family history of ischemic heart disease and other diseases of the circulatory system: Secondary | ICD-10-CM | POA: Diagnosis not present

## 2020-09-25 DIAGNOSIS — F419 Anxiety disorder, unspecified: Secondary | ICD-10-CM | POA: Diagnosis present

## 2020-09-25 DIAGNOSIS — F32A Depression, unspecified: Secondary | ICD-10-CM | POA: Diagnosis present

## 2020-09-25 DIAGNOSIS — Z20822 Contact with and (suspected) exposure to covid-19: Secondary | ICD-10-CM | POA: Diagnosis present

## 2020-09-25 DIAGNOSIS — M960 Pseudarthrosis after fusion or arthrodesis: Secondary | ICD-10-CM | POA: Diagnosis present

## 2020-09-25 DIAGNOSIS — Z833 Family history of diabetes mellitus: Secondary | ICD-10-CM | POA: Diagnosis not present

## 2020-09-25 DIAGNOSIS — G4733 Obstructive sleep apnea (adult) (pediatric): Secondary | ICD-10-CM | POA: Diagnosis present

## 2020-09-25 DIAGNOSIS — Z87891 Personal history of nicotine dependence: Secondary | ICD-10-CM | POA: Diagnosis not present

## 2020-09-25 DIAGNOSIS — Z86718 Personal history of other venous thrombosis and embolism: Secondary | ICD-10-CM | POA: Diagnosis not present

## 2020-09-25 DIAGNOSIS — Z86711 Personal history of pulmonary embolism: Secondary | ICD-10-CM | POA: Diagnosis not present

## 2020-09-25 DIAGNOSIS — Z7952 Long term (current) use of systemic steroids: Secondary | ICD-10-CM | POA: Diagnosis not present

## 2020-09-25 DIAGNOSIS — Z7984 Long term (current) use of oral hypoglycemic drugs: Secondary | ICD-10-CM | POA: Diagnosis not present

## 2020-09-25 DIAGNOSIS — Z79899 Other long term (current) drug therapy: Secondary | ICD-10-CM | POA: Diagnosis not present

## 2020-09-25 DIAGNOSIS — I10 Essential (primary) hypertension: Secondary | ICD-10-CM | POA: Diagnosis present

## 2020-09-25 DIAGNOSIS — Z7982 Long term (current) use of aspirin: Secondary | ICD-10-CM | POA: Diagnosis not present

## 2020-09-25 DIAGNOSIS — E119 Type 2 diabetes mellitus without complications: Secondary | ICD-10-CM | POA: Diagnosis present

## 2020-09-25 DIAGNOSIS — K219 Gastro-esophageal reflux disease without esophagitis: Secondary | ICD-10-CM | POA: Diagnosis present

## 2020-09-25 LAB — GLUCOSE, CAPILLARY
Glucose-Capillary: 158 mg/dL — ABNORMAL HIGH (ref 70–99)
Glucose-Capillary: 128 mg/dL — ABNORMAL HIGH (ref 70–99)

## 2020-09-25 MED ORDER — METHOCARBAMOL 500 MG PO TABS: 500.0000 mg | ORAL_TABLET | Freq: Four times a day (QID) | ORAL | 0 refills | Status: DC | PRN

## 2020-09-25 MED ORDER — HYDROMORPHONE HCL 2 MG PO TABS
1.0000 mg | ORAL_TABLET | Freq: Four times a day (QID) | ORAL | Status: DC | PRN
  Administered 2020-09-25: 2 mg via ORAL
  Filled 2020-09-25: qty 1

## 2020-09-25 MED ORDER — HYDROMORPHONE HCL 2 MG PO TABS
1.0000 mg | ORAL_TABLET | ORAL | Status: DC | PRN
  Administered 2020-09-25: 2 mg via ORAL
  Filled 2020-09-25: qty 1

## 2020-09-25 MED ORDER — CYCLOBENZAPRINE HCL 10 MG PO TABS
10.0000 mg | ORAL_TABLET | Freq: Three times a day (TID) | ORAL | Status: DC
  Administered 2020-09-25: 10 mg via ORAL
  Filled 2020-09-25: qty 1

## 2020-09-25 MED ORDER — CYCLOBENZAPRINE HCL 10 MG PO TABS: 10.0000 mg | ORAL_TABLET | Freq: Three times a day (TID) | ORAL | 0 refills | Status: AC

## 2020-09-25 MED ORDER — HYDROMORPHONE HCL 2 MG PO TABS: 1.0000 mg | ORAL_TABLET | Freq: Four times a day (QID) | ORAL | 0 refills | Status: DC | PRN

## 2020-09-25 MED ORDER — HYDROMORPHONE HCL 2 MG PO TABS: 1.0000 mg | ORAL_TABLET | ORAL | 0 refills | Status: DC | PRN

## 2020-09-25 NOTE — Progress Notes (Signed)
    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  The patient has been up and walking and eating and drinking.  She does report significant postoperative posterior neck pain and shoulder pain.  She states that the only relief she gets is when she is administered the IV morphine.  She states that with her previous cervical procedure by another provider she had a PCA pump.  She otherwise has normal bowel and bladder habits and is resting comfortably.   Physical Exam: Vitals:   09/25/20 0400 09/25/20 0727  BP: 119/77 121/75  Pulse: 87 89  Resp: 20 18  Temp: 98.4 F (36.9 C) 98.1 F (36.7 C)  SpO2: 96% 95%    Dressing in place,clean dry and intact.  Her hard collar was worn appropriately.  Her neck is soft and supple.  She appears comfortable throughout the appointment. NVI  POD #1 s/p C3-C7 posterior cervical fusion for nonunion with expected postoperative posterior neck pain  - PO Dilaudid 1-2 mg every 6 hours for pain.  Patient educated that we do not utilize PCA pumps andthat she cannot go home on morphine. - encourage ambulation - Robaxin for muscle spasms - medications sent electronically to her pharmacy - likely d/c home today with f/u in 2 weeks

## 2020-09-25 NOTE — Progress Notes (Signed)
Patient is discharged from room 3C09 at this time. Alert and in stable condition. IV site d/c'd and instructions read to patient and brother with understanding verbalized and all questions answered. Left unit via wheelchair with all belongings at side.

## 2020-09-25 NOTE — Discharge Summary (Signed)
Patient ID: [name] MRN: [id number] DOB/AGE: 05/17/1965 55 y.o.  Admit date: 09/24/2020 Discharge date: 09/25/2020  Admission Diagnoses:  Active Problems:   Pseudoarthrosis of cervical spine [facility])   Discharge Diagnoses:  Same  Past Medical History:  Diagnosis Date  . Anemia   . Anxiety   . Arthritis    osteoarthritis  . Atypical chest pain    negative Myoview in 2011 with no ischemi and normal LV function.   . Benign neoplasm of colon   . Depression   . Diabetes mellitus without complication ([place])   . DVT (deep venous thrombosis) ([place])   . Dyspnea   . GERD (gastroesophageal reflux disease)    uses Nexium  . Heart murmur   . History of hiatal hernia   . HTN (hypertension)   . Long Q-T syndrome   . Obesity   . OSA (obstructive sleep apnea)    uses C-PAP  . PE (pulmonary embolism)   . Peripheral vascular disease ([place])    blood clot in right leg  . Pneumonia   . Prolonged QT interval    gene positive  . Seizures ([place])    hasn't had any for 25 years    Surgeries: Procedure(s): POSTERIOR CERVICAL DECOMPRESSION FUSION CERVICAL 3 - CERVICAL 7 WITH INSTRUMENTATION AND ALLOGRAFT on 09/24/2020   Consultants: None  Discharged Condition: Improved  Hospital Course: [name] is an 55 y.o. female who was admitted 09/24/2020 for operative treatment of pseudoarthrosis. Patient has severe unremitting pain that affects sleep, daily activities, and work/hobbies. After pre-op clearance the patient was taken to the operating room on 09/24/2020 and underwent  Procedure(s): POSTERIOR CERVICAL DECOMPRESSION FUSION CERVICAL 3 - CERVICAL 7 WITH INSTRUMENTATION AND ALLOGRAFT.    Patient was given perioperative antibiotics:  Anti-infectives (From admission, onward)   Start     Dose/Rate Route Frequency Ordered Stop   09/24/20 1600  ceFAZolin (ANCEF) IVPB 2g/100 mL premix        2 g 200 mL/hr over 30 Minutes Intravenous Every 8 hours 09/24/20 1412 09/25/20 0029    09/24/20 0600  ceFAZolin (ANCEF) IVPB 2g/100 mL premix        2 g 200 mL/hr over 30 Minutes Intravenous On call to O.R. 09/24/20 5009 09/24/20 3818       Patient was given sequential compression devices, early ambulation to prevent DVT.  Patient benefited maximally from hospital stay and there were no complications.    Recent vital signs:  Patient Vitals for the past 24 hrs:  BP Temp Temp src Pulse Resp SpO2  09/25/20 0727 121/75 98.1 F (36.7 C) Oral 89 18 95 %  09/25/20 0400 119/77 98.4 F (36.9 C) Oral 87 20 96 %  09/24/20 2320 105/65 98.3 F (36.8 C) Oral 82 20 94 %  09/24/20 1939 134/81 98.3 F (36.8 C) Oral 87 18 96 %  09/24/20 1629 124/77 98.2 F (36.8 C) Oral 77 18 92 %  09/24/20 1412 121/74 (!) 97.5 F (36.4 C) Oral 73 18 98 %  09/24/20 1345 126/78 -- -- 79 19 93 %  09/24/20 1332 123/75 -- -- 77 (!) 24 100 %  09/24/20 1317 115/78 -- -- 72 (!) 6 96 %  09/24/20 1302 118/76 -- -- 74 (!) 9 99 %  09/24/20 1247 112/71 -- -- 75 16 99 %  09/24/20 1217 109/69 -- -- 75 16 100 %  09/24/20 1207 116/73 -- -- 77 17 100 %  09/24/20 1202 93/77 -- --  76 18 99 %  09/24/20 1147 116/66 (!) 97.1 F (36.2 C) -- 76 15 98 %     Discharge Medications:   Allergies as of 09/25/2020   No Known Allergies     Medication List    TAKE these medications   Accu-Chek Softclix Lancets lancets Use as instructed   albuterol 108 (90 Base) MCG/ACT inhaler Commonly known as: VENTOLIN HFA Inhale 1-2 puffs into the lungs every 4 (four) hours as needed for wheezing or shortness of breath.   budesonide-formoterol 160-4.5 MCG/ACT inhaler Commonly known as: SYMBICORT Inhale 2 puffs into the lungs 2 (two) times daily as needed (for shortness of breath).   cyclobenzaprine 10 MG tablet Commonly known as: FLEXERIL Take 2 tablets (20 mg total) by mouth at bedtime.   escitalopram 20 MG tablet Commonly known as: LEXAPRO Take 1 tablet (20 mg total) by mouth daily. What changed: how much to take    ferrous sulfate 325 (65 FE) MG tablet Take 325 mg by mouth daily with breakfast.   fluticasone 50 MCG/ACT nasal spray Commonly known as: FLONASE Place 2 sprays into both nostrils daily.   gabapentin 600 MG tablet Commonly known as: NEURONTIN Take 1 tablet (600 mg total) by mouth 3 (three) times daily with meals.   Glucosamine 1500 Complex Caps Take 1 capsule by mouth daily.   HYDROmorphone 2 MG tablet Commonly known as: DILAUDID Take 0.5-1 tablets (1-2 mg total) by mouth every 6 (six) hours as needed for severe pain.   labetalol 200 MG tablet Commonly known as: NORMODYNE Take 1 tablet (200 mg total) by mouth 2 (two) times daily. Please keep upcoming appt with Dr. Caryl Comes in January before anymore refills. Thank you   lisinopril 2.5 MG tablet Commonly known as: ZESTRIL Take 2.5 mg by mouth daily.   loratadine 10 MG tablet Commonly known as: CLARITIN Take 1 tablet (10 mg total) by mouth daily.   metFORMIN 500 MG tablet Commonly known as: GLUCOPHAGE Take 500 mg by mouth daily.   methocarbamol 500 MG tablet Commonly known as: ROBAXIN Take 1 tablet (500 mg total) by mouth every 6 (six) hours as needed for muscle spasms.   multivitamin with minerals Tabs tablet Take 1 tablet by mouth daily.   nabumetone 750 MG tablet Commonly known as: RELAFEN Take 750 mg by mouth 2 (two) times daily.   Narcan 4 MG/0.1ML Liqd nasal spray kit Generic drug: naloxone Place 1 spray into the nose See admin instructions. As needed for opioid overdose.   omeprazole 40 MG capsule Commonly known as: PRILOSEC Take 40 mg by mouth daily.   Ozempic (1 MG/DOSE) 4 MG/3ML Sopn Generic drug: Semaglutide (1 MG/DOSE) Inject 1 mg into the skin every Saturday.   potassium chloride SA 20 MEQ tablet Commonly known as: Klor-Con M20 One daily What changed:   how much to take  how to take this  when to take this  additional instructions   pravastatin 20 MG tablet Commonly known as:  PRAVACHOL Take 20 mg by mouth daily.   spironolactone 25 MG tablet Commonly known as: ALDACTONE Take 25 mg by mouth daily.   triamterene-hydrochlorothiazide 75-50 MG tablet Commonly known as: MAXZIDE Take 1 tablet by mouth daily.   Vitamin D (Ergocalciferol) 1.25 MG (50000 UNIT) Caps capsule Commonly known as: DRISDOL Take 50,000 Units by mouth every Saturday.       Diagnostic Studies: DG Cervical Spine 2 or 3 views  Result Date: 09/24/2020 CLINICAL DATA:  Intraoperative localization for posterior cervical  decompression EXAM: CERVICAL SPINE - 2 VIEW COMPARISON:  07/17/2020 FINDINGS: Initial images demonstrate changes of prior anterior cervical fusion at C3-4. Surgical instruments are at the C4-5 level posteriorly. Subsequent image again demonstrates the anterior fusion at C3-4 with posterior fusion at C3 and C5 and extending more inferiorly but obscured by overlying soft tissue. IMPRESSION: Intraoperative localization for cervical fusion. Electronically Signed   By: Inez Catalina M.D.   On: 09/24/2020 12:35    Disposition: Discharge disposition: 01-Home or Self Care       Discharge Instructions    Discharge patient   Complete by: As directed    Discharge disposition: 01-Home or Self Care   Discharge patient date: 09/25/2020     POD #1 s/p C3-C7 posterior cervical fusion for nonunion with expected postoperative posterior neck pain  - PO Dilaudid 1-2 mg every 6 hours for pain.  Patient educated that we do not utilize PCA pumps andthat she cannot go home on morphine. - encourage ambulation - Robaxin for muscle spasms - medications sent electronically to her pharmacy  -D/C instructions sheet printed and in chart -D/C today  -F/U in office 2 weeks   Signed: Lennie Muckle Pradeep Beaubrun 09/25/2020, 9:03 AM

## 2020-09-29 ENCOUNTER — Encounter (HOSPITAL_COMMUNITY): Payer: Self-pay | Admitting: Orthopedic Surgery

## 2020-10-12 ENCOUNTER — Encounter (HOSPITAL_COMMUNITY): Payer: Self-pay | Admitting: Orthopedic Surgery

## 2020-10-13 ENCOUNTER — Encounter (HOSPITAL_COMMUNITY): Payer: Self-pay | Admitting: Orthopedic Surgery

## 2020-12-01 ENCOUNTER — Telehealth: Payer: Self-pay | Admitting: Internal Medicine

## 2020-12-01 NOTE — Telephone Encounter (Signed)
  *  STAT* If patient is at the pharmacy, call can be transferred to refill team.   1. Which medications need to be refilled? (please list name of each medication and dose if known) labetalol (NORMODYNE) 200 MG tablet  2. Which pharmacy/location (including street and city if local pharmacy) is medication to be sent to? [facility] - [place], [place] - [address]  3. Do they need a 30 day or 90 day supply? 90 days  Pt is out of meds for 2 weeks

## 2020-12-02 ENCOUNTER — Telehealth: Payer: Self-pay | Admitting: Internal Medicine

## 2020-12-02 MED ORDER — LABETALOL HCL 200 MG PO TABS: 200.0000 mg | ORAL_TABLET | Freq: Two times a day (BID) | ORAL | 3 refills | Status: AC

## 2020-12-02 NOTE — Telephone Encounter (Signed)
Refill for Labetalol has been sent to [facility].

## 2020-12-02 NOTE — Telephone Encounter (Signed)
Spoke with pt who states her son [name] is being referred to Dr Caryl Comes for Long QT syndrome by his PCP.  Pt states her son will need to be seen next week because he will be leaving for college to play football.  Pt advised once referral has been received a scheduler will be in contact to schedule appointment for son.  Pt verbalizes understanding and agrees with current plan.

## 2020-12-02 NOTE — Telephone Encounter (Signed)
   Pt is requesting to speak with Dr. Olin Pia nurse regarding setting up an appt for her son, [name], explained about referral, but pt is insiting to speak with Dr. Olin Pia nurse, she said to call her back as soon as possible today

## 2020-12-30 ENCOUNTER — Other Ambulatory Visit (HOSPITAL_BASED_OUTPATIENT_CLINIC_OR_DEPARTMENT_OTHER): Payer: Self-pay

## 2020-12-30 DIAGNOSIS — R5383 Other fatigue: Secondary | ICD-10-CM

## 2020-12-30 DIAGNOSIS — R0683 Snoring: Secondary | ICD-10-CM

## 2021-02-04 ENCOUNTER — Other Ambulatory Visit: Payer: Self-pay | Admitting: Family Medicine

## 2021-02-04 DIAGNOSIS — Z1231 Encounter for screening mammogram for malignant neoplasm of breast: Secondary | ICD-10-CM

## 2021-02-24 ENCOUNTER — Ambulatory Visit: Payer: Medicare HMO | Admitting: Internal Medicine

## 2021-02-24 DIAGNOSIS — I4581 Long QT syndrome: Secondary | ICD-10-CM

## 2021-03-04 ENCOUNTER — Ambulatory Visit: Payer: Medicare HMO

## 2021-03-18 ENCOUNTER — Telehealth: Payer: Self-pay | Admitting: Internal Medicine

## 2021-03-18 ENCOUNTER — Other Ambulatory Visit: Payer: Self-pay | Admitting: Otolaryngology

## 2021-03-18 NOTE — Telephone Encounter (Signed)
Primary Cardiologist:Steven Caryl Comes, MD  Chart reviewed as part of pre-operative protocol coverage. Because of [name]'s past medical history and time since last visit, he/she will require a follow-up visit in order to better assess preoperative cardiovascular risk.  Pre-op covering staff: - Please schedule appointment and call patient to inform them. - Please contact requesting surgeon's office via preferred method (i.e, phone, fax) to inform them of need for appointment prior to surgery.  If applicable, this message will also be routed to pharmacy pool and/or primary cardiologist for input on holding anticoagulant/antiplatelet agent as requested below so that this information is available at time of patient's appointment.   Deberah Pelton, NP  03/18/2021, 10:27 AM

## 2021-03-18 NOTE — Telephone Encounter (Signed)
   [facility] Pre-operative Risk Assessment    Patient Name: [name]  DOB: June 10, 1965 MRN: [id number]  HEARTCARE STAFF:  - IMPORTANT!!!!!! Under Visit Info/Reason for Call, type in Other and utilize the format Clearance MM/DD/YY or Clearance TBD. Do not use dashes or single digits. - Please review there is not already an duplicate clearance open for this procedure. - If request is for dental extraction, please clarify the # of teeth to be extracted. - If the patient is currently at the dentist's office, call Pre-Op Callback Staff (MA/nurse) to input urgent request.  - If the patient is not currently in the dentist office, please route to the Pre-Op pool.  Request for surgical clearance:  What type of surgery is being performed? Septoplasty bilateral inferior turbinate reduction  When is this surgery scheduled? 03-31-21  What type of clearance is required (medical clearance vs. Pharmacy clearance to hold med vs. Both)? both  Are there any medications that need to be held prior to surgery and how long?   Practice name and name of physician performing surgery?  Dr [name]   What is the office phone number?   [phone]  7.   What is the office fax number? [phone] Att: [name]  8.   Anesthesia type (None, local, MAC, general) ? general   [name] 03/18/2021, 9:35 AM  _________________________________________________________________   (provider comments below)

## 2021-03-18 NOTE — Telephone Encounter (Signed)
Left message for the pt to call ASAP for appt for pre op clearance . I was going to offer the pt 03/19/21 at 3:30 with Dr. Ed Blalock DOD 24 hour acute time slot.

## 2021-03-18 NOTE — Telephone Encounter (Signed)
Pt has been notified of pre op appt. Correction on previous note from earlier, pt does not see Dr. Irish Lack, pt see's Dr. Caryl Comes. Pt agreeable to plan of care and thanked me for the thelp. I have placed pt on wait list as well. Pt appt is with Tommye Standard, [facility] 03/30/21 @ 8 am. I will send notes to [facility] for upcoming appt as well as FYI to requesting office.

## 2021-03-28 NOTE — Progress Notes (Signed)
Cardiology Office Note Date:  03/28/2021  Patient ID:  Sonia, [name] Jan 25, 1966, MRN [id number] PCP:  [name], NP  Cardiologist:  Dr. Caryl Comes   Chief Complaint:   pre-op  History of Present Illness: [name] is a 55 y.o. female with history of long QT syndrome (gene +) on Inderal, recurrent DVT/PE, anxiety/depression, HTN, ? seizure d/o, she states this is when she was found with long QT, OSA she reports complaint with CPAP, orthostasis dizziness morbid obesity, chronic neck pain  I saw her in Nov 13,2017, at that visit she had c/o a unusual DOE, she had CXR was negative and echo was planned.  Note mentions discussion regarding hx of DVT/PE years prior though this was provoked by LE trauma and she had not had any recurrent injury or travel, a fairly recent CT had not suggested PE, exam did not suggest DVT.  She underwent C-spine surgery a few weeks later without noted complications discharged 03/31/16.  On 04/04/16 admitted after a syncopal episode and c/o SOB, she was found with an acute b/l PE discharged 04/07/16 on lovenox to f/u w/PMD anf referral to heme.  Her echo noted LVEF 55-60%, no RV strain was described. No significant VHD.  Syncopal event that led to her hospital stay sounds clearly of orthostasis, got out of the care, felt dizzy like she does from time to time upon standing, felt weak, and lightheaded though thought she could get inside to lean against the wall, once insde fainted.  No CP, + SOB on/off for days, no palpitations, + diaphoresis.  I saw her agan July 2018 She comes today states her PMD started her on xarelto after her hospital stay though afterwards developed at first minimal/intermittent blood in her urine, and then about 6 weeks or more ago acutely increased to bright red blood every time she used the bathroom, wether it was urine or stool, very bright red, at times maroon colored and very foul smelling, and her menses increased with large clots  as well.  She had this ongoing for about a week or more, felt awful, very weak some days staying the day in bed all day, she did not seek attention, figured it was the blood thinner.  She reports a fainting spell during this week when she was out shopping with her son, was having ongoing weakness/no energy and fainted, she did not seek attention then either but stopped the xarelto, her bleeding slowed and stopped.  She has been off a/c a month feeling improved without recurrent syncope or bleeding.  She mentions she felt like she was having unusual abdominal symptoms, not pain.  She reports she had a trip/fall accident 2 weeks ago, hitting her left thigh had large bruising that she was worried about. She has not had recurrent syncope, bleeding since stopping xarelto, SOB, no CP, palpitations. She was urged to see her PMD for perhaps hematology evaluation and recommendations for her recurrent DVT/PE  She last Dr. Caryl Comes Jan 2021 she was doing well, using a complicated med/diuretic regime but was working and no changes were made.  She saw A. Lynnell Jude, NP May 2022, again doing well, pending C spine surgery, felt to be low/acceptable cardiac risk.  She comes in today to be seen for Dr. Caryl Comes for pre-op clearance prior septoplasty   TODAY She states that she suffered a broken nose and has had trouble breathing through one side of her nose and needs to have surgery to correct the deviation.  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 She is constantly on the go. No exertional intolerances,or difficulties with ADLs No dizzy spells, near syncope ro syncope No palpitations, CP or cardiac awareness No SOB, DOE    Past Medical History:  Diagnosis Date   Anemia    Anxiety    Arthritis    osteoarthritis   Atypical chest pain    negative Myoview in 2011 with no ischemi and normal LV function.    Benign neoplasm of colon    Depression    Diabetes mellitus  without complication (HCC)    DVT (deep venous thrombosis) (HCC)    Dyspnea    GERD (gastroesophageal reflux disease)    uses Nexium   Heart murmur    History of hiatal hernia    HTN (hypertension)    Long Q-T syndrome    Obesity    OSA (obstructive sleep apnea)    uses C-PAP   PE (pulmonary embolism)    Peripheral vascular disease (HCC)    blood clot in right leg   Pneumonia    Prolonged QT interval    gene positive   Seizures ([place])    hasn't had any for 25 years    Past Surgical History:  Procedure Laterality Date   ANTERIOR CERVICAL DECOMP/DISCECTOMY FUSION N/A 06/25/2013   Procedure: ANTERIOR CERVICAL DECOMPRESSION/DISCECTOMY FUSION 2 LEVELS LEVELS C FOUR/FIVE, SIX/SEVEN;  Surgeon: Floyce Stakes, MD;  Location: [facility];  Service: Neurosurgery;  Laterality: N/A;   ANTERIOR CERVICAL DECOMP/DISCECTOMY FUSION N/A 03/29/2016   Procedure: CERVICAL THREE-FOUR, CERVICAL FOUR-FIVE ANTERIOR CERVICAL DECOMPRESSION/DISCECTOMY/FUSION WITH REMOVAL OF PLATES AT H3-7 J6-9;  Surgeon: Leeroy Cha, MD;  Location: [facility];  Service: Neurosurgery;  Laterality: N/A;  C3-4 C5-6 ANTERIOR CERVICAL DECOMPRESSION/DISCECTOMY/FUSION WITH POSSIBLE REMOVAL OF PLATE AT C7-8 L3-8   BACK SURGERY     CARDIAC CATHETERIZATION     COLONOSCOPY     COLONOSCOPY WITH PROPOFOL N/A 01/16/2018   Procedure: COLONOSCOPY WITH PROPOFOL;  Surgeon: Lollie Sails, MD;  Location: [facility] ENDOSCOPY;  Service: Endoscopy;  Laterality: N/A;   DILATION AND CURETTAGE OF UTERUS     ESOPHAGOGASTRODUODENOSCOPY (EGD) WITH PROPOFOL N/A 11/17/2015   Procedure: ESOPHAGOGASTRODUODENOSCOPY (EGD) WITH PROPOFOL;  Surgeon: Lollie Sails, MD;  Location: [facility] ENDOSCOPY;  Service: Endoscopy;  Laterality: N/A;   ESOPHAGOGASTRODUODENOSCOPY (EGD) WITH PROPOFOL N/A 01/16/2018   Procedure: ESOPHAGOGASTRODUODENOSCOPY (EGD) WITH PROPOFOL;  Surgeon: Lollie Sails, MD;  Location: [facility] ENDOSCOPY;  Service: Endoscopy;  Laterality: N/A;    JOINT REPLACEMENT  04/2006   hip   left foot surgery     left hand surgery x 2     LUMBAR DISC SURGERY     POSTERIOR CERVICAL FUSION/FORAMINOTOMY N/A 09/24/2020   Procedure: POSTERIOR CERVICAL DECOMPRESSION FUSION CERVICAL 3 - CERVICAL 7 WITH INSTRUMENTATION AND ALLOGRAFT;  Surgeon: Phylliss Bob, MD;  Location: [place];  Service: Orthopedics;  Laterality: N/A;   right hand surgery for torn ligament     TOTAL HIP ARTHROPLASTY      Current Outpatient Medications  Medication Sig Dispense Refill   Accu-Chek Softclix Lancets lancets Use as instructed 100 each 12   albuterol (PROVENTIL HFA;VENTOLIN HFA) 108 (90 BASE) MCG/ACT inhaler Inhale 1-2 puffs into the lungs every 4 (four) hours as needed for wheezing or shortness of breath.     budesonide-formoterol (SYMBICORT) 160-4.5 MCG/ACT inhaler Inhale 2 puffs into the lungs 2 (two) times daily as needed (for shortness of breath).  cyclobenzaprine (FLEXERIL) 10 MG tablet Take 2 tablets (20 mg total) by mouth at bedtime. 30 tablet 0   cyclobenzaprine (FLEXERIL) 10 MG tablet Take 1 tablet (10 mg total) by mouth 3 (three) times daily. 30 tablet 0   escitalopram (LEXAPRO) 20 MG tablet Take 1 tablet (20 mg total) by mouth daily. (Patient taking differently: Take 40 mg by mouth daily.) 90 tablet 0   ferrous sulfate 325 (65 FE) MG tablet Take 325 mg by mouth daily with breakfast.     fluticasone (FLONASE) 50 MCG/ACT nasal spray Place 2 sprays into both nostrils daily.     gabapentin (NEURONTIN) 600 MG tablet Take 1 tablet (600 mg total) by mouth 3 (three) times daily with meals. 90 tablet 1   Glucosamine-Chondroit-Vit C-Mn (GLUCOSAMINE 1500 COMPLEX) CAPS Take 1 capsule by mouth daily.     HYDROmorphone (DILAUDID) 2 MG tablet Take 0.5-1 tablets (1-2 mg total) by mouth every 6 (six) hours as needed for severe pain. 30 tablet 0   HYDROmorphone (DILAUDID) 2 MG tablet Take 0.5-1 tablets (1-2 mg total) by mouth every 4 (four) hours as needed for severe pain. 30  tablet 0   labetalol (NORMODYNE) 200 MG tablet Take 1 tablet (200 mg total) by mouth 2 (two) times daily. 180 tablet 3   lisinopril (ZESTRIL) 2.5 MG tablet Take 2.5 mg by mouth daily.     loratadine (CLARITIN) 10 MG tablet Take 1 tablet (10 mg total) by mouth daily. 90 tablet 3   metFORMIN (GLUCOPHAGE) 500 MG tablet Take 500 mg by mouth daily.     Multiple Vitamin (MULTIVITAMIN WITH MINERALS) TABS tablet Take 1 tablet by mouth daily. 90 tablet 3   nabumetone (RELAFEN) 750 MG tablet Take 750 mg by mouth 2 (two) times daily.     NARCAN 4 MG/0.1ML LIQD nasal spray kit Place 1 spray into the nose See admin instructions. As needed for opioid overdose.     omeprazole (PRILOSEC) 40 MG capsule Take 40 mg by mouth daily.     OZEMPIC, 1 MG/DOSE, 4 MG/3ML SOPN Inject 1 mg into the skin every Saturday.     potassium chloride SA (KLOR-CON M20) 20 MEQ tablet One daily (Patient taking differently: Take 20 mEq by mouth daily.) 60 tablet 1   pravastatin (PRAVACHOL) 20 MG tablet Take 20 mg by mouth daily.     spironolactone (ALDACTONE) 25 MG tablet Take 25 mg by mouth daily.     triamterene-hydrochlorothiazide (MAXZIDE) 75-50 MG tablet Take 1 tablet by mouth daily.     Vitamin D, Ergocalciferol, (DRISDOL) 1.25 MG (50000 UNIT) CAPS capsule Take 50,000 Units by mouth every Saturday.     No current facility-administered medications for this visit.    Allergies:   Patient has no known allergies.   Social History:  The patient  reports that she quit smoking about 19 years ago. She has never used smokeless tobacco. She reports current alcohol use of about 2.0 standard drinks per week. She reports that she does not currently use drugs.   Family History:  The patient's family history includes Breast cancer in her paternal aunt; Breast cancer (age of onset: 67) in her maternal aunt; Breast cancer (age of onset: 5) in her maternal aunt; CAD in her father and mother; Diabetes in her brother and father; Long QT syndrome  in her mother.  ROS:  Please see the history of present illness.  All other systems are reviewed and otherwise negative.   PHYSICAL EXAM:  VS:  LMP  12/05/2016  BMI: There is no height or weight on file to calculate BMI. O2sat on RA is 98% Well nourished, well developed, in no acute distress  HEENT: normocephalic, atraumatic  Neck: no JVD, carotid bruits or masses Cardiac: RRR no significant murmurs, no rubs, or gallops Lungs:  CTA b/l, no wheezing, rhonchi or rales  Abd: soft, non-tender, obese MS: no deformity or atrophy Ext: no edema Skin: warm and dry, no rash Neuro:  No gross deficits appreciated Psych: euthymic mood, full affect   EKG: done today and reviewed by myself: SR 73bpm, no changes from prior, QTc 452m   04/06/2016: TTE Study Conclusions  - Left ventricle: The cavity size was normal. Wall thickness was    normal. Systolic function was normal. The estimated ejection    fraction was in the range of 55% to 60%. Wall motion was normal;    there were no regional wall motion abnormalities. Doppler    parameters are consistent with abnormal left ventricular    relaxation (grade 1 diastolic dysfunction).  - Mitral valve: There was mild regurgitation.  - Pulmonary arteries: Systolic pressure was mildly increased. PA    peak pressure: 36 mm Hg (S).    12/17/15: CT chest/abdomen w/contrast  IMPRESSION: 1. Negative CT the chest. The pulmonary arteries are faintly opacified with no central abnormality noted 2. Negative CT the abdomen. No explanation for the patient's left upper quadrant pain is seen   Recent Labs: 09/22/2020: ALT 16; BUN 12; Creatinine, Ser 0.97; Hemoglobin 13.6; Platelets 275; Potassium 3.7; Sodium 138  No results found for requested labs within last 8760 hours.   CrCl cannot be calculated (Patient's most recent lab result is older than the maximum 21 days allowed.).   Wt Readings from Last 3 Encounters:  09/24/20 253 lb (114.8 kg)  09/22/20 253  lb 6.4 oz (114.9 kg)  09/22/20 256 lb (116.1 kg)     Other studies reviewed: Additional studies/records reviewed today include: summarized above   ASSESSMENT AND PLAN:  1. Long QT syndrome     Stable, on BB    We re-discussed avoiding QT prolonging medicines    Med list reviewed with RSheridan   She has been on lexapro for years, avoid/cautioned about increasing dose   2. Pre-op Low cardiac risk score Low risk surgery DUKE METS 9.89 No cardiac contraindication to surgery planned Avoid QT prolonging agents         Disposition: we can continue to see her annually, sooner if needed   Current medicines are reviewed at length with the patient today.  The patient did not have any concerns regarding medicines.  SHaywood Lasso PA-C 03/28/2021 6:38 PM     [place] [phone](office)  [phone](fax)

## 2021-03-29 ENCOUNTER — Encounter (HOSPITAL_COMMUNITY): Payer: Self-pay | Admitting: Otolaryngology

## 2021-03-29 ENCOUNTER — Other Ambulatory Visit: Payer: Self-pay

## 2021-03-29 NOTE — Progress Notes (Signed)
PCP - Charlott Holler, NP Cardiologist - Virl Axe, MD EKG - 09/22/20 Chest x-ray -  ECHO - 04/06/16 Cardiac Cath -  CPAP -   Blood Thinner Instructions:  Aspirin Instructions:   ERAS Protcol -   COVID TEST- n/a  Anesthesia review: yes  -------------  SDW INSTRUCTIONS:  Your procedure is scheduled on Wednesday 11/30. Please report to [facility] Main Entrance "A" at 0530 A.M., and check in at the Admitting office. Call this number if you have problems the morning of surgery: [phone]   Remember: Do not eat or drink after midnight the night before your surgery   Medications to take morning of surgery with a sip of water include: Inhaler --- Please bring all inhalers with you the day of surgery.  cyclobenzaprine (FLEXERIL)  escitalopram (LEXAPRO)  gabapentin (NEURONTIN)  labetalol (NORMODYNE)  loratadine (CLARITIN)  omeprazole (PRILOSEC)  pravastatin (PRAVACHOL)    ** PLEASE check your blood sugar the morning of your surgery when you wake up and every 2 hours until you get to the Short Stay unit.  If your blood sugar is less than 70 mg/dL, you will need to treat for low blood sugar: Do not take insulin. Treat a low blood sugar (less than 70 mg/dL) with  cup of clear juice (cranberry or apple), 4 glucose tablets, OR glucose gel. Recheck blood sugar in 15 minutes after treatment (to make sure it is greater than 70 mg/dL). If your blood sugar is not greater than 70 mg/dL on recheck, call [phone] for further instructions.  NO ORAL DIABETIC MEDICATIONS MORNING OF SURGERY - including Metformin  As of today, STOP taking any Aspirin (unless otherwise instructed by your surgeon), Aleve, Naproxen, Ibuprofen, Motrin, Advil, Goody's, BC's, all herbal medications, fish oil, and all vitamins.    The Morning of Surgery Do not wear jewelry, make-up or nail polish. Do not wear lotions, powders, or perfumes, or deodorant  Do not bring valuables to the hospital. [facility] is not  responsible for any belongings or valuables.  If you are a smoker, DO NOT Smoke 24 hours prior to surgery  If you wear a CPAP at night please bring your mask the morning of surgery   Remember that you must have someone to transport you home after your surgery, and remain with you for 24 hours if you are discharged the same day.  Please bring cases for contacts, glasses, hearing aids, dentures or bridgework because it cannot be worn into surgery.   Patients discharged the day of surgery will not be allowed to drive home.   Please shower the NIGHT BEFORE/MORNING OF SURGERY (use antibacterial soap like DIAL soap if possible). Wear comfortable clothes the morning of surgery. Oral Hygiene is also important to reduce your risk of infection.  Remember - BRUSH YOUR TEETH THE MORNING OF SURGERY WITH YOUR REGULAR TOOTHPASTE  Patient denies shortness of breath, fever, cough and chest pain.

## 2021-03-30 ENCOUNTER — Encounter: Payer: Self-pay | Admitting: Physician Assistant

## 2021-03-30 ENCOUNTER — Ambulatory Visit (INDEPENDENT_AMBULATORY_CARE_PROVIDER_SITE_OTHER): Payer: Medicare HMO | Admitting: Physician Assistant

## 2021-03-30 VITALS — BP 110/72 | HR 73 | Ht 64.0 in | Wt 242.0 lb

## 2021-03-30 DIAGNOSIS — Z01818 Encounter for other preprocedural examination: Secondary | ICD-10-CM | POA: Diagnosis not present

## 2021-03-30 DIAGNOSIS — I4581 Long QT syndrome: Secondary | ICD-10-CM | POA: Diagnosis not present

## 2021-03-30 NOTE — Anesthesia Preprocedure Evaluation (Addendum)
Anesthesia Evaluation  Patient identified by MRN, date of birth, ID band Patient awake    Reviewed: Allergy & Precautions, NPO status , Patient's Chart, lab work & pertinent test results  Airway Mallampati: II       Dental   Pulmonary shortness of breath, sleep apnea , pneumonia, former smoker,    breath sounds clear to auscultation       Cardiovascular hypertension, + Peripheral Vascular Disease   Rhythm:Regular Rate:Normal     Neuro/Psych Seizures -,  PSYCHIATRIC DISORDERS  Neuromuscular disease    GI/Hepatic hiatal hernia, GERD  ,  Endo/Other  diabetes  Renal/GU      Musculoskeletal  (+) Arthritis ,   Abdominal   Peds  Hematology   Anesthesia Other Findings   Reproductive/Obstetrics                          Anesthesia Physical Anesthesia Plan  ASA: 3  Anesthesia Plan: General   Post-op Pain Management:    Induction: Intravenous  PONV Risk Score and Plan: Ondansetron and Midazolam  Airway Management Planned: Oral ETT  Additional Equipment:   Intra-op Plan:   Post-operative Plan: Extubation in OR  Informed Consent: I have reviewed the patients History and Physical, chart, labs and discussed the procedure including the risks, benefits and alternatives for the proposed anesthesia with the patient or authorized representative who has indicated his/her understanding and acceptance.     Dental advisory given  Plan Discussed with: Anesthesiologist and CRNA  Anesthesia Plan Comments: (PAT note by Karoline Caldwell, PA-C: Follows with cardiology for history of long QT syndrome (gene +)on Inderal,recurrentDVT/PE (not on anticoagulation due to reports of bleeding while on Xarelto), HTN, OSA on CPAP.  Echo 2017 showed EF 55 to 60%, normal wall motion, grade 1 DD, mild MR.  Seen by cardiology PA-C Tommye Standard 03/30/2021 for preop evaluation.  Note still pending as of 4:30 PM.  After visit  summary states she is to continue current medications and follow-up in 1 year.  She was previously cleared by cardiology on 09/22/2020 to undergo ACDF by Dr. Lynann Bologna which she had 3/74/8270 without complication.  Questionable history of seizure disorder with remote seizure.  Per notes, patient has stated that this was when she was found to have long QT syndrome.  History of DM2, last A1c in epic was 5.8 on 09/22/2020.  History of C3-C7 ACDF.  She will need day of surgery labs and evaluation.  QTc 423.  EKG 09/22/2020: Sinus rhythm.  Rate 83.  Cardiac event monitor 11/23/2016: Duration: 13d  Findings Rare PVVC  Symptomatic lightheadedness assoc with sinus rhyhtm  No malignant arrhythmia   TTE 04/06/2016: - Left ventricle: The cavity size was normal. Wall thickness was  normal. Systolic function was normal. The estimated ejection  fraction was in the range of 55% to 60%. Wall motion was normal;  there were no regional wall motion abnormalities. Doppler  parameters are consistent with abnormal left ventricular  relaxation (grade 1 diastolic dysfunction).  - Mitral valve: There was mild regurgitation.  - Pulmonary arteries: Systolic pressure was mildly increased. PA  peak pressure: 36 mm Hg (S).     )      Anesthesia Quick Evaluation

## 2021-03-30 NOTE — Progress Notes (Signed)
Anesthesia Chart Review: Same day workup  Follows with cardiology for history of long QT syndrome (gene +) on Inderal, recurrent DVT/PE (not on anticoagulation due to reports of bleeding while on Xarelto), HTN, OSA on CPAP.  Echo 2017 showed EF 55 to 60%, normal wall motion, grade 1 DD, mild MR.  Seen by cardiology PA-C Tommye Standard 03/30/2021 for preop evaluation.  Note still pending as of 4:30 PM.  After visit summary states she is to continue current medications and follow-up in 1 year.  She was previously cleared by cardiology on 09/22/2020 to undergo ACDF by Dr. Lynann Bologna which she had 2/63/3354 without complication.  Questionable history of seizure disorder with remote seizure.  Per notes, patient has stated that this was when she was found to have long QT syndrome.  History of DM2, last A1c in epic was 5.8 on 09/22/2020.  History of C3-C7 ACDF.  She will need day of surgery labs and evaluation.  QTc 423.  EKG 09/22/2020: Sinus rhythm.  Rate 83.  Cardiac event monitor 11/23/2016: Duration: 13d   Findings Rare PVVC   Symptomatic lightheadedness assoc with sinus rhyhtm  No malignant arrhythmia   TTE 04/06/2016: - Left ventricle: The cavity size was normal. Wall thickness was    normal. Systolic function was normal. The estimated ejection    fraction was in the range of 55% to 60%. Wall motion was normal;    there were no regional wall motion abnormalities. Doppler    parameters are consistent with abnormal left ventricular    relaxation (grade 1 diastolic dysfunction).  - Mitral valve: There was mild regurgitation.  - Pulmonary arteries: Systolic pressure was mildly increased. PA    peak pressure: 36 mm Hg (S).    Wynonia Musty [facility] Short Stay Center/Anesthesiology Phone [phone] 03/30/2021 4:16 PM

## 2021-03-30 NOTE — Patient Instructions (Signed)
Medication Instructions:   Your physician recommends that you continue on your current medications as directed. Please refer to the Current Medication list given to you today.  *If you need a refill on your cardiac medications before your next appointment, please call your pharmacy*   Lab Work: [place]   If you have labs (blood work) drawn today and your tests are completely normal, you will receive your results only by: [place] (if you have MyChart) OR A paper copy in the mail If you have any lab test that is abnormal or we need to change your treatment, we will call you to review the results.   Testing/Procedures: NONE ORDERED  TODAY     Follow-Up: At [facility], you and your health needs are our priority.  As part of our continuing mission to provide you with exceptional heart care, we have created designated Provider Care Teams.  These Care Teams include your primary Cardiologist (physician) and Advanced Practice Providers (APPs -  Physician Assistants and Nurse Practitioners) who all work together to provide you with the care you need, when you need it.  We recommend signing up for the patient portal called "MyChart".  Sign up information is provided on this After Visit Summary.  MyChart is used to connect with patients for Virtual Visits (Telemedicine).  Patients are able to view lab/test results, encounter notes, upcoming appointments, etc.  Non-urgent messages can be sent to your provider as well.   To learn more about what you can do with MyChart, go to [email or web address].    Your next appointment:   1 year(s)  The format for your next appointment:   In Person  Provider:   You may see Dr. Caryl Comes or one of the following Advanced Practice Providers on your designated Care Team:   Tommye Standard, [place] Legrand Como "Jonni Sanger" Chalmers Cater, [place]    Other Instructions

## 2021-03-31 ENCOUNTER — Other Ambulatory Visit (HOSPITAL_COMMUNITY): Payer: Self-pay

## 2021-03-31 ENCOUNTER — Ambulatory Visit (HOSPITAL_COMMUNITY): Payer: Medicare HMO | Admitting: Physician Assistant

## 2021-03-31 ENCOUNTER — Other Ambulatory Visit: Payer: Self-pay

## 2021-03-31 ENCOUNTER — Encounter (HOSPITAL_COMMUNITY): Payer: Self-pay | Admitting: Otolaryngology

## 2021-03-31 ENCOUNTER — Encounter (HOSPITAL_COMMUNITY)
Admission: RE | Disposition: A | Discharge status: Home / Self Care | Payer: Self-pay | Source: Home / Self Care | Attending: Otolaryngology

## 2021-03-31 ENCOUNTER — Ambulatory Visit (HOSPITAL_COMMUNITY)
Admission: RE | Admit: 2021-03-31 | Discharge: 2021-03-31 | Disposition: A | Discharge status: Home / Self Care | Payer: Medicare HMO | Attending: Otolaryngology | Admitting: Otolaryngology

## 2021-03-31 DIAGNOSIS — I4581 Long QT syndrome: Secondary | ICD-10-CM | POA: Insufficient documentation

## 2021-03-31 DIAGNOSIS — K449 Diaphragmatic hernia without obstruction or gangrene: Secondary | ICD-10-CM | POA: Diagnosis not present

## 2021-03-31 DIAGNOSIS — K219 Gastro-esophageal reflux disease without esophagitis: Secondary | ICD-10-CM | POA: Diagnosis not present

## 2021-03-31 DIAGNOSIS — M1990 Unspecified osteoarthritis, unspecified site: Secondary | ICD-10-CM | POA: Diagnosis not present

## 2021-03-31 DIAGNOSIS — J342 Deviated nasal septum: Secondary | ICD-10-CM | POA: Diagnosis present

## 2021-03-31 DIAGNOSIS — Z87891 Personal history of nicotine dependence: Secondary | ICD-10-CM | POA: Insufficient documentation

## 2021-03-31 DIAGNOSIS — E1151 Type 2 diabetes mellitus with diabetic peripheral angiopathy without gangrene: Secondary | ICD-10-CM | POA: Diagnosis not present

## 2021-03-31 DIAGNOSIS — J343 Hypertrophy of nasal turbinates: Secondary | ICD-10-CM | POA: Diagnosis not present

## 2021-03-31 DIAGNOSIS — S022XXA Fracture of nasal bones, initial encounter for closed fracture: Secondary | ICD-10-CM | POA: Insufficient documentation

## 2021-03-31 DIAGNOSIS — Z86718 Personal history of other venous thrombosis and embolism: Secondary | ICD-10-CM | POA: Diagnosis not present

## 2021-03-31 DIAGNOSIS — I10 Essential (primary) hypertension: Secondary | ICD-10-CM | POA: Insufficient documentation

## 2021-03-31 DIAGNOSIS — Z86711 Personal history of pulmonary embolism: Secondary | ICD-10-CM | POA: Insufficient documentation

## 2021-03-31 DIAGNOSIS — G4733 Obstructive sleep apnea (adult) (pediatric): Secondary | ICD-10-CM | POA: Insufficient documentation

## 2021-03-31 DIAGNOSIS — G709 Myoneural disorder, unspecified: Secondary | ICD-10-CM | POA: Insufficient documentation

## 2021-03-31 DIAGNOSIS — Z981 Arthrodesis status: Secondary | ICD-10-CM | POA: Diagnosis not present

## 2021-03-31 HISTORY — PX: NASAL SEPTOPLASTY W/ TURBINOPLASTY: SHX2070

## 2021-03-31 LAB — BASIC METABOLIC PANEL
Anion gap: 8 (ref 5–15)
BUN: 20 mg/dL (ref 6–20)
CO2: 27 mmol/L (ref 22–32)
Calcium: 9.3 mg/dL (ref 8.9–10.3)
Chloride: 103 mmol/L (ref 98–111)
Creatinine, Ser: 0.93 mg/dL (ref 0.44–1.00)
GFR, Estimated: >60 mL/min (ref >60)
Glucose, Bld: 95 mg/dL (ref 70–99)
Potassium: 3.5 mmol/L (ref 3.5–5.1)
Sodium: 138 mmol/L (ref 135–145)

## 2021-03-31 LAB — CBC
HCT: 38.3 % (ref 36.0–46.0)
Hemoglobin: 12.3 g/dL (ref 12.0–15.0)
MCH: 29.8 pg (ref 26.0–34.0)
MCHC: 32.1 g/dL (ref 30.0–36.0)
MCV: 92.7 fL (ref 80.0–100.0)
Platelets: 223 10*3/uL (ref 150–400)
RBC: 4.13 MIL/uL (ref 3.87–5.11)
RDW: 13.1 % (ref 11.5–15.5)
WBC: 6.3 10*3/uL (ref 4.0–10.5)
nRBC: 0 % (ref 0.0–0.2)

## 2021-03-31 LAB — GLUCOSE, CAPILLARY
Glucose-Capillary: 79 mg/dL (ref 70–99)
Glucose-Capillary: 124 mg/dL — ABNORMAL HIGH (ref 70–99)

## 2021-03-31 SURGERY — SEPTOPLASTY, NOSE, WITH NASAL TURBINATE REDUCTION
Anesthesia: General | Site: Nose | Laterality: Bilateral

## 2021-03-31 MED ORDER — CHLORHEXIDINE GLUCONATE 0.12 % MT SOLN: 15.0000 mL | Freq: Once | OROMUCOSAL | Status: AC

## 2021-03-31 MED ORDER — ROCURONIUM BROMIDE 10 MG/ML (PF) SYRINGE
PREFILLED_SYRINGE | INTRAVENOUS | Status: AC
  Filled 2021-03-31: qty 10

## 2021-03-31 MED ORDER — FENTANYL CITRATE (PF) 250 MCG/5ML IJ SOLN
INTRAMUSCULAR | Status: DC | PRN
  Administered 2021-03-31: 100 ug via INTRAVENOUS

## 2021-03-31 MED ORDER — FENTANYL CITRATE (PF) 250 MCG/5ML IJ SOLN
INTRAMUSCULAR | Status: AC
  Filled 2021-03-31: qty 5

## 2021-03-31 MED ORDER — BACITRACIN ZINC 500 UNIT/GM EX OINT
TOPICAL_OINTMENT | CUTANEOUS | Status: AC
  Filled 2021-03-31: qty 28.35

## 2021-03-31 MED ORDER — SUGAMMADEX SODIUM 200 MG/2ML IV SOLN
INTRAVENOUS | Status: DC | PRN
  Administered 2021-03-31: 200 mg via INTRAVENOUS

## 2021-03-31 MED ORDER — BACITRACIN 500 UNIT/GM EX OINT
TOPICAL_OINTMENT | CUTANEOUS | Status: DC | PRN
  Administered 2021-03-31: 1 via TOPICAL

## 2021-03-31 MED ORDER — PROPOFOL 10 MG/ML IV BOLUS
INTRAVENOUS | Status: DC | PRN
  Administered 2021-03-31: 110 mg via INTRAVENOUS

## 2021-03-31 MED ORDER — LIDOCAINE-EPINEPHRINE 1 %-1:100000 IJ SOLN
INTRAMUSCULAR | Status: DC | PRN
  Administered 2021-03-31: 15 mL

## 2021-03-31 MED ORDER — ONDANSETRON HCL 4 MG/2ML IJ SOLN
INTRAMUSCULAR | Status: AC
  Filled 2021-03-31: qty 2

## 2021-03-31 MED ORDER — DOCUSATE SODIUM 100 MG PO CAPS
100.0000 mg | ORAL_CAPSULE | Freq: Two times a day (BID) | ORAL | 0 refills | Status: AC | PRN
  Filled 2021-03-31: qty 14, 7d supply, fill #0

## 2021-03-31 MED ORDER — MIDAZOLAM HCL 2 MG/2ML IJ SOLN
INTRAMUSCULAR | Status: AC
  Filled 2021-03-31: qty 2

## 2021-03-31 MED ORDER — DEXAMETHASONE SODIUM PHOSPHATE 10 MG/ML IJ SOLN
INTRAMUSCULAR | Status: DC | PRN
  Administered 2021-03-31: 10 mg via INTRAVENOUS

## 2021-03-31 MED ORDER — OXYMETAZOLINE HCL 0.05 % NA SOLN
NASAL | Status: AC
  Filled 2021-03-31: qty 30

## 2021-03-31 MED ORDER — CHLORHEXIDINE GLUCONATE 0.12 % MT SOLN
OROMUCOSAL | Status: AC
  Administered 2021-03-31: 15 mL via OROMUCOSAL
  Filled 2021-03-31: qty 15

## 2021-03-31 MED ORDER — LACTATED RINGERS IV SOLN: INTRAVENOUS | Status: DC

## 2021-03-31 MED ORDER — HYDROCODONE-ACETAMINOPHEN 5-325 MG PO TABS
1.0000 | ORAL_TABLET | Freq: Four times a day (QID) | ORAL | 0 refills | Status: AC | PRN
  Filled 2021-03-31: qty 25, 7d supply, fill #0

## 2021-03-31 MED ORDER — LIDOCAINE 2% (20 MG/ML) 5 ML SYRINGE
INTRAMUSCULAR | Status: DC | PRN
  Administered 2021-03-31: 60 mg via INTRAVENOUS

## 2021-03-31 MED ORDER — ROCURONIUM BROMIDE 10 MG/ML (PF) SYRINGE
PREFILLED_SYRINGE | INTRAVENOUS | Status: DC | PRN
  Administered 2021-03-31: 50 mg via INTRAVENOUS

## 2021-03-31 MED ORDER — PHENYLEPHRINE 40 MCG/ML (10ML) SYRINGE FOR IV PUSH (FOR BLOOD PRESSURE SUPPORT)
PREFILLED_SYRINGE | INTRAVENOUS | Status: DC | PRN
  Administered 2021-03-31 (×3): 80 ug via INTRAVENOUS

## 2021-03-31 MED ORDER — LIDOCAINE-EPINEPHRINE 1 %-1:100000 IJ SOLN
INTRAMUSCULAR | Status: AC
  Filled 2021-03-31: qty 1

## 2021-03-31 MED ORDER — FENTANYL CITRATE (PF) 100 MCG/2ML IJ SOLN: 25.0000 ug | INTRAMUSCULAR | Status: DC | PRN

## 2021-03-31 MED ORDER — SUCCINYLCHOLINE CHLORIDE 200 MG/10ML IV SOSY
PREFILLED_SYRINGE | INTRAVENOUS | Status: DC | PRN
  Administered 2021-03-31: 170 mg via INTRAVENOUS

## 2021-03-31 MED ORDER — ORAL CARE MOUTH RINSE: 15.0000 mL | Freq: Once | OROMUCOSAL | Status: AC

## 2021-03-31 MED ORDER — LIDOCAINE 2% (20 MG/ML) 5 ML SYRINGE
INTRAMUSCULAR | Status: AC
  Filled 2021-03-31: qty 5

## 2021-03-31 MED ORDER — PHENYLEPHRINE 40 MCG/ML (10ML) SYRINGE FOR IV PUSH (FOR BLOOD PRESSURE SUPPORT)
PREFILLED_SYRINGE | INTRAVENOUS | Status: AC
  Filled 2021-03-31: qty 10

## 2021-03-31 MED ORDER — DEXAMETHASONE SODIUM PHOSPHATE 10 MG/ML IJ SOLN
INTRAMUSCULAR | Status: AC
  Filled 2021-03-31: qty 1

## 2021-03-31 MED ORDER — MIDAZOLAM HCL 2 MG/2ML IJ SOLN
INTRAMUSCULAR | Status: DC | PRN
  Administered 2021-03-31: 2 mg via INTRAVENOUS

## 2021-03-31 MED ORDER — PHENYLEPHRINE HCL-NACL 20-0.9 MG/250ML-% IV SOLN
INTRAVENOUS | Status: DC | PRN
  Administered 2021-03-31: 50 ug/min via INTRAVENOUS

## 2021-03-31 MED ORDER — PROPOFOL 10 MG/ML IV BOLUS
INTRAVENOUS | Status: AC
  Filled 2021-03-31: qty 20

## 2021-03-31 MED ORDER — ONDANSETRON HCL 4 MG/2ML IJ SOLN
INTRAMUSCULAR | Status: DC | PRN
  Administered 2021-03-31: 4 mg via INTRAVENOUS

## 2021-03-31 MED ORDER — 0.9 % SODIUM CHLORIDE (POUR BTL) OPTIME
TOPICAL | Status: DC | PRN
  Administered 2021-03-31: 1000 mL

## 2021-03-31 MED ORDER — OXYMETAZOLINE HCL 0.05 % NA SOLN
NASAL | Status: DC | PRN
  Administered 2021-03-31: 1 via TOPICAL

## 2021-03-31 SURGICAL SUPPLY — 27 items
BAG COUNTER SPONGE SURGICOUNT (BAG) ×2 IMPLANT
BLADE INF TURB ROT M4 2 5PK (BLADE) ×2 IMPLANT
CANISTER SUCT 3000ML PPV (MISCELLANEOUS) ×2 IMPLANT
COAGULATOR SUCT SWTCH 10FR 6 (ELECTROSURGICAL) ×2 IMPLANT
ELECT REM PT RETURN 9FT ADLT (ELECTROSURGICAL) ×2
ELECTRODE REM PT RTRN 9FT ADLT (ELECTROSURGICAL) ×1 IMPLANT
GAUZE SPONGE 2X2 8PLY STRL LF (GAUZE/BANDAGES/DRESSINGS) ×1 IMPLANT
GLOVE SURG ENC MOIS LTX SZ6.5 (GLOVE) ×2 IMPLANT
GOWN STRL REUS W/ TWL LRG LVL3 (GOWN DISPOSABLE) ×2 IMPLANT
GOWN STRL REUS W/TWL LRG LVL3 (GOWN DISPOSABLE) ×4
KIT BASIN OR (CUSTOM PROCEDURE TRAY) ×2 IMPLANT
KIT TURNOVER KIT B (KITS) ×2 IMPLANT
NEEDLE HYPO 25GX1X1/2 BEV (NEEDLE) ×2 IMPLANT
NS IRRIG 1000ML POUR BTL (IV SOLUTION) ×2 IMPLANT
PAD ARMBOARD 7.5X6 YLW CONV (MISCELLANEOUS) ×2 IMPLANT
PATTIES SURGICAL .5 X3 (DISPOSABLE) ×2 IMPLANT
SOL ANTI FOG 6CC (MISCELLANEOUS) ×1 IMPLANT
SOLUTION ANTI FOG 6CC (MISCELLANEOUS) ×1
SPLINT NASAL DOYLE BI-VL (GAUZE/BANDAGES/DRESSINGS) ×2 IMPLANT
SPONGE GAUZE 2X2 STER 10/PKG (GAUZE/BANDAGES/DRESSINGS) ×1
SUT CHROMIC 4 0 P 3 18 (SUTURE) ×2 IMPLANT
SUT PLAIN 4 0 ~~LOC~~ 1 (SUTURE) ×2 IMPLANT
SUT SILK 2 0 SH (SUTURE) ×2 IMPLANT
TOWEL GREEN STERILE FF (TOWEL DISPOSABLE) ×2 IMPLANT
TRAY ENT MC OR (CUSTOM PROCEDURE TRAY) ×2 IMPLANT
TUBE SALEM SUMP 16 FR W/ARV (TUBING) ×2 IMPLANT
TUBING EXTENTION W/L.L. (IV SETS) ×2 IMPLANT

## 2021-03-31 NOTE — Anesthesia Postprocedure Evaluation (Signed)
Anesthesia Post Note  Patient: [name]  Procedure(s) Performed: NASAL SEPTOPLASTY WITH INFERIOR TURBINATE REDUCTION AND REPAIR OF NASAL VALVE COLLAPSE (Bilateral: Nose)     Patient location during evaluation: PACU Anesthesia Type: General Level of consciousness: awake Pain management: pain level controlled Vital Signs Assessment: post-procedure vital signs reviewed and stable Respiratory status: spontaneous breathing Cardiovascular status: stable Postop Assessment: no apparent nausea or vomiting Anesthetic complications: no   No notable events documented.  Last Vitals:  Vitals:   03/31/21 0920 03/31/21 0935  BP: 121/67 128/82  Pulse: 74 73  Resp: (!) 24 17  Temp:  36.7 C  SpO2: 93% 92%    Last Pain:  Vitals:   03/31/21 0935  TempSrc:   PainSc: 0-No pain                 Damyra Luscher

## 2021-03-31 NOTE — Anesthesia Postprocedure Evaluation (Signed)
Anesthesia Post Note  Patient: [name]  Procedure(s) Performed: NASAL SEPTOPLASTY WITH INFERIOR TURBINATE REDUCTION AND REPAIR OF NASAL VALVE COLLAPSE (Bilateral: Nose)     Patient location during evaluation: PACU Anesthesia Type: General Level of consciousness: awake Pain management: pain level controlled Vital Signs Assessment: post-procedure vital signs reviewed and stable Respiratory status: spontaneous breathing Cardiovascular status: stable Postop Assessment: no apparent nausea or vomiting Anesthetic complications: no   No notable events documented.  Last Vitals:  Vitals:   03/31/21 0920 03/31/21 0935  BP: 121/67 128/82  Pulse: 74 73  Resp: (!) 24 17  Temp:  36.7 C  SpO2: 93% 92%    Last Pain:  Vitals:   03/31/21 0935  TempSrc:   PainSc: 0-No pain                 Graceland Wachter

## 2021-03-31 NOTE — Anesthesia Procedure Notes (Signed)
Procedure Name: Intubation Date/Time: 03/31/2021 7:44 AM Performed by: Michele Rockers, CRNA Pre-anesthesia Checklist: Patient identified, Patient being monitored, Timeout performed, Emergency Drugs available and Suction available Patient Re-evaluated:Patient Re-evaluated prior to induction Oxygen Delivery Method: Circle System Utilized Preoxygenation: Pre-oxygenation with 100% oxygen Induction Type: IV induction Ventilation: Mask ventilation without difficulty Laryngoscope Size: Miller and 2 Grade View: Grade I Tube type: Oral Tube size: 7.5 mm Number of attempts: 1 Airway Equipment and Method: Stylet Placement Confirmation: ETT inserted through vocal cords under direct vision, positive ETCO2 and breath sounds checked- equal and bilateral Secured at: 23 cm Tube secured with: Tape Dental Injury: Teeth and Oropharynx as per pre-operative assessment

## 2021-03-31 NOTE — Transfer of Care (Signed)
Immediate Anesthesia Transfer of Care Note  Patient: [name]  Procedure(s) Performed: NASAL SEPTOPLASTY WITH INFERIOR TURBINATE REDUCTION AND REPAIR OF NASAL VALVE COLLAPSE (Bilateral: Nose)  Patient Location: PACU  Anesthesia Type:General  Level of Consciousness: awake, patient cooperative and responds to stimulation  Airway & Oxygen Therapy: Patient Spontanous Breathing and Patient connected to face mask oxygen  Post-op Assessment: Report given to RN, Post -op Vital signs reviewed and stable and Patient moving all extremities X 4  Post vital signs: Reviewed and stable  Last Vitals:  Vitals Value Taken Time  BP 122/59 03/31/21 0903  Temp    Pulse 75 03/31/21 0908  Resp 22 03/31/21 0908  SpO2 93 % 03/31/21 0908  Vitals shown include unvalidated device data.  Last Pain:  Vitals:   03/31/21 0636  TempSrc:   PainSc: 0-No pain         Complications: No notable events documented.

## 2021-03-31 NOTE — Discharge Instructions (Signed)
SEPTOPLASTY Post Operative Instructions  Office: [phone]  The Surgery Itself Septoplasty and turbinate reduction involves general anesthesia, typically for one to two hours. Patients may be sedated for several hours after surgery and may remain sleepy for the better part of the day. Nausea and vomiting are occasionally seen, and usually resolve by the evening of surgery - even without additional medications. Almost all patients can go home the day of surgery.  After Surgery  Facial pressure and fullness similar to a sinus infection/headache is normal after surgery. Breathing through your nose is also difficult due to swelling. A humidifier or vaporizer can be used in the bedroom to prevent throat pain with mouth breathing.   Bloody nasal drainage is normal after this surgery for 5-7 days, usually decreasing in volume with each day that passes. Drainage will flow from the front of the nose and down the back of the throat. Make sure you spit out blood drainage that drips down the back of your throat to prevent nausea/vomiting. You will have a nasal drip pad/sling with gauze to catch drainage from the front of your nose. The dressing may need to be changed frequently during the first 24 hours following surgery. In case of profuse nasal bleeding, you may apply ice to the bridge of the nose and pinch the nose just above the tip and hold for 10 minutes; if bleeding continues, contact the doctors office.   Frequent hot showers or saline nasal rinses (NeilMed) will help break up congestion and clear any clot or mucus that builds up within the nose after surgery. This can be started the day after surgery.   It is more comfortable to sleep with extra pillows or in a recliner for the first few days after surgery until the drainage begins to resolve.    Do not blow your nose for 2 weeks after surgery.   Avoid lifting > 10 lbs. and no vigorous exercise for 2 weeks after Surgery.   Avoid airplane  travel for 2 weeks following sinus surgery; the cabin pressure changes can cause pain and swelling within the nose/sinuses.   Sense of smell and taste are often diminished for several weeks after surgery. There may be some tenderness or numbness in your upper front teeth, which is normal after surgery. You may express old clot, discolored mucus or very large nasal crusts from your nose for up to 3-4 weeks after surgery; depending on how frequently and how effectively you irrigate your nose with the saltwater spray.   You may have absorbable sutures inside of your nose after surgery that will slowly dissolve in 2-3 weeks. Be careful when clearing crusts from the nose since they may be attached to these sutures.  Medications  Pain medication can be used for pain as prescribed. Pain and pressure in the nose is expected after surgery. As the surgical site heals, pain will resolve over the course of a week. Pain medications can cause nausea, which can be prevented if you take them with food or milk.   You can use 2 nasal sprays after surgery: Afrin can be used up to 2 times a day for up to 5 days after surgery (best before bed) to reduce bloody drainage from the nose for the first few days after surgery. Saline/salt water spray can be used as often as you would like starting the day after surgery to prevent crusting inside of the nose.   Take all of your routine medications as prescribed, unless told otherwise   by your surgeon. Any medications that thin the blood should be avoided. This includes aspirin. Avoid aspirin-like products for the first 72 hours after surgery (Advil, Motrin, Excedrin, Alieve, Celebrex, Naprosyn), but you may use them as needed for pain after 72 hours.   
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
## 2021-03-31 NOTE — Progress Notes (Signed)
Patient up to recliner reports neighbor does not get off work till 500 no other transportation available at this time to take her home.  Patient has walked to bathroom and tolerating liquids

## 2021-03-31 NOTE — Progress Notes (Signed)
Patient takes Labetalol at home.  Last dose was yesterday (night before surgery).  Dr. Nyoka Cowden notified.  No new orders given.

## 2021-03-31 NOTE — Op Note (Signed)
OPERATIVE NOTE  [facility] Date/Time of Admission: 03/31/2021  5:46 AM  CSN: [id number] Attending Provider: Ebbie Latus A, DO Room/Bed: MCPO/NONE DOB: 1965/08/29 Age: 55 y.o.   Pre-Op Diagnosis: Septal deviation; Inferior turbinate hypertrophy, nasal valve collapse  Post-Op Diagnosis: Septal deviation; Inferior turbinate hypertrophy, nasal valve collapse  Procedure: Procedure(s): NASAL SEPTOPLASTY WITH INFERIOR TURBINATE REDUCTION AND REPAIR OF NASAL VALVE COLLAPSE  Anesthesia: General  Surgeon(s): Gurpreet Mikhail A Haizlee Henton, DO  Staff: Circulator: Rulon Abide, RN Scrub Person: Martin Majestic  Implants: * No implants in log *  Specimens: * No specimens in log *  Complications: None  EBL: 30 ML  IVF: 700 ML  Condition: stable  Operative Findings:  Right septal deviation, bilateral inferior turbinate hypertrophy, narrow nasal valve  Description of Operation: Once operative consent was obtained and the site and surgery were confirmed with the patient and the operating room team, the patient was brought back to the operating room and general endotracheal anesthesia was obtained. The patient was turned over to the ENT service. Lidocaine 1% with 1:100,000 epinephrine was injected into the nasal septum bilaterally and inferior turbinates bilaterally. Afrin-soaked pledgets were placed into the nasal cavity, and the patient was prepped and draped in sterile fashion. Attention was first turned to the left nasal vestibule. A left sided hemi-transfixion incision was made a submucoperichondrial flap was elevated on the left.  A submucous resection of  nasal septal cartilage was performed with care taken to leave a 1.5 cm caudal and dorsal strut. In doing this, a right-sided submucoperichondrial flap was elevated.  The bony nasal septum was addressed by lifting up the soft tissues, separating the superior septum with a double action scissor and then  removing the inferior deflected portion. The nasal spine was removed with a 68mm osteotome. With this completed, the nasal septum was midline. The submucoperichondrial flaps were returned to their anatomic position and hemi-transfixion incision was closed with interrupted 4-0 chromic gut. A 4-0 plain gut suture was used to perform a mattress style stitch in a circular direction from anterior to posterior septum to re-approximate the right and left sided flaps.  Attention was then turned to the inferior turbinates.They were outfractured and then submucous resection was performed by making an incision in the leading edge with a 15 blade, separating the mucosa from bone with a Cottle elevator and then using the micro debrider with a turbinate blade to remove bone.  \ Next, bilateral alar baton grafts were fashioned from the harvested septal cartilage. On each side of the nasal cavity, a 5 mm incision was made over the pyriform aperture. A pocket the size of the graft was made with a converse scissor and the graft was placed in the pocket. The incisions were closed with interrupted 5-0 Chromic suture.   Doyle nasal splints were placed in the bilateral nasal cavities and sutured to the columella with a 3-0 Silk suture and a nasal drip pad was applied. An orogastric tube was placed and the stomach cavity was suctioned to reduce postoperative nausea. The patient was turned over to anesthesia service and was extubated in the operating room and transferred to the PACU in stable condition. The patient will be discharged today and followed up in the ENT clinic in 1 week for  splint removal and a postoperative check.    Jason Coop, [facility]  03/31/2021

## 2021-04-01 ENCOUNTER — Encounter (HOSPITAL_COMMUNITY): Payer: Self-pay | Admitting: Otolaryngology

## 2021-04-15 ENCOUNTER — Other Ambulatory Visit: Payer: Self-pay | Admitting: Family Medicine

## 2021-04-15 DIAGNOSIS — Z1231 Encounter for screening mammogram for malignant neoplasm of breast: Secondary | ICD-10-CM

## 2021-06-08 ENCOUNTER — Institutional Professional Consult (permissible substitution): Payer: Medicare HMO | Admitting: Adult Health

## 2021-06-08 ENCOUNTER — Institutional Professional Consult (permissible substitution): Payer: Medicare HMO | Admitting: Pulmonary Disease

## 2021-06-22 ENCOUNTER — Institutional Professional Consult (permissible substitution): Payer: Medicare HMO | Admitting: Adult Health

## 2021-06-28 ENCOUNTER — Institutional Professional Consult (permissible substitution): Payer: Medicare HMO | Admitting: Adult Health

## 2021-07-31 DEATH — deceased
